# Patient Record
Sex: Female | Born: 1970 | Race: White | Hispanic: No | Marital: Single | Smoking: Never smoker
Health system: Southern US, Community
[De-identification: ages and names within clinical notes are randomized; demographics above are authoritative.]

## PROBLEM LIST (undated history)

## (undated) DIAGNOSIS — E119 Type 2 diabetes mellitus without complications: Secondary | ICD-10-CM

## (undated) DIAGNOSIS — F329 Major depressive disorder, single episode, unspecified: Secondary | ICD-10-CM

## (undated) DIAGNOSIS — K567 Ileus, unspecified: Secondary | ICD-10-CM

## (undated) DIAGNOSIS — M79673 Pain in unspecified foot: Secondary | ICD-10-CM

## (undated) DIAGNOSIS — G5793 Unspecified mononeuropathy of bilateral lower limbs: Secondary | ICD-10-CM

## (undated) DIAGNOSIS — R569 Unspecified convulsions: Secondary | ICD-10-CM

## (undated) DIAGNOSIS — G43909 Migraine, unspecified, not intractable, without status migrainosus: Secondary | ICD-10-CM

## (undated) DIAGNOSIS — E2740 Unspecified adrenocortical insufficiency: Secondary | ICD-10-CM

## (undated) DIAGNOSIS — F959 Tic disorder, unspecified: Secondary | ICD-10-CM

## (undated) DIAGNOSIS — I10 Essential (primary) hypertension: Secondary | ICD-10-CM

## (undated) DIAGNOSIS — F32A Depression, unspecified: Secondary | ICD-10-CM

## (undated) DIAGNOSIS — G629 Polyneuropathy, unspecified: Secondary | ICD-10-CM

## (undated) DIAGNOSIS — F1910 Other psychoactive substance abuse, uncomplicated: Secondary | ICD-10-CM

## (undated) DIAGNOSIS — M549 Dorsalgia, unspecified: Secondary | ICD-10-CM

## (undated) DIAGNOSIS — F419 Anxiety disorder, unspecified: Secondary | ICD-10-CM

## (undated) DIAGNOSIS — M1990 Unspecified osteoarthritis, unspecified site: Secondary | ICD-10-CM

## (undated) DIAGNOSIS — L0390 Cellulitis, unspecified: Secondary | ICD-10-CM

## (undated) DIAGNOSIS — G8929 Other chronic pain: Secondary | ICD-10-CM

## (undated) DIAGNOSIS — M797 Fibromyalgia: Secondary | ICD-10-CM

## (undated) HISTORY — DX: Cellulitis, unspecified: L03.90

## (undated) HISTORY — DX: Polyneuropathy, unspecified: G62.9

## (undated) HISTORY — PX: CHOLECYSTECTOMY: SHX55

## (undated) HISTORY — DX: Ileus, unspecified: K56.7

## (undated) HISTORY — DX: Unspecified convulsions: R56.9

## (undated) HISTORY — DX: Migraine, unspecified, not intractable, without status migrainosus: G43.909

## (undated) HISTORY — PX: OTHER SURGICAL HISTORY: SHX169

## (undated) HISTORY — DX: Anxiety disorder, unspecified: F41.9

## (undated) HISTORY — DX: Tic disorder, unspecified: F95.9

---
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
## 2010-04-30 HISTORY — PX: GASTRIC BYPASS: SHX52

## 2011-03-02 HISTORY — PX: ABDOMINOPLASTY: SUR9

## 2011-03-02 HISTORY — PX: ABDOMINAL HYSTERECTOMY: SHX81

## 2012-08-11 ENCOUNTER — Encounter (HOSPITAL_COMMUNITY): Payer: Self-pay | Admitting: *Deleted

## 2012-08-11 ENCOUNTER — Ambulatory Visit (HOSPITAL_COMMUNITY)
Admission: RE | Admit: 2012-08-11 | Discharge: 2012-08-11 | Disposition: A | Discharge status: Home / Self Care | Payer: PRIVATE HEALTH INSURANCE | Attending: Psychiatry | Admitting: Psychiatry

## 2012-08-11 DIAGNOSIS — F1020 Alcohol dependence, uncomplicated: Secondary | ICD-10-CM | POA: Insufficient documentation

## 2012-08-11 HISTORY — DX: Essential (primary) hypertension: I10

## 2012-08-11 NOTE — BH Assessment (Signed)
Assessment Note   Bethany Barton is an 42 y.o. female In for appt to have an assessment for CD IOP. She has an appt with Charmian Muff on June 16th at 10am and starts the CD program that afternoon.She brought up paperwork for client to complete and to bring to her appt on Monday and that paperwork was given to client. She is currently using alcohol every two to three weeks, and when drinks is drinking 3 liters of wine and blacks out. She has a history of cocaine and narcotic use but none in eight months. She had a 12 year sobriety until she has gastric bypass surgury in March 2012 and she got addicted to pain medications and got detoxed and put on Suboxone. She  is now off of that and has been for the past eight months. Her father died in an automobile crash in Nov and that increased her alcohol use. 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 She is currently on FMLA leave and feels her agency is looking at her more closely and critically than others and her dept has been unsupportive. She is attending AA at least four nights a week. Her last drink was eight days ago. She is not currently experiencing and symptoms of withdrawal.She is currently taking Prozac for a history of depression. 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 She has hypertension and is taking Lisinopril.She does not take her Neurontin regularly for her pain because her children say it makes her more irritable.She takes Xanax prn to help her sleep,this past week she has taken it once and denies abusing it.She has a long family history of substance use on both sides and her  mother is currently incarcerated related to substance use.She has a current DUI charge and a court date for July 9 th which she states she is not worried about, she believes it will be dropped.She has three biological children and one foster child and one grandchild she is motivated by to be clean as well as a supportive boyfriend. States she is motivated for treatment. Walked downstairs to outpatient to be familiar with where she would be going on Monday.   Axis I: alcohol dependence, narcotic dependence in remission,cocaine dependence in remission Axis II: Deferred Axis III:  Past Medical History  Diagnosis Date  . Hypertension    Axis IV: occupational problems and other psychosocial or environmental problems Axis V: 41-50 serious symptoms  Past Medical History:  Past Medical History  Diagnosis Date  . Hypertension     Past Surgical History  Procedure Laterality Date  . 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 Abdominal hysterectomy      Family History: No family history on file.  Social History:  reports that she drinks about 4.8 ounces of alcohol per week. She reports that she does not use illicit drugs. Her tobacco history is not on file.  Additional Social History:  Alcohol / Drug Use Pain Medications: not abusing now but histroy of abusing pain meds since 04/2011 after a surgury, last use 8 months ago Prescriptions: history  of use last used 8 months ago not abusing now Over the Counter: not abusing History of alcohol / drug use?: Yes Longest period of sobriety (when/how long): 12 years Negative Consequences of Use: Work / Programmer, multimedia (FMLA currently, work is requesting she be in treatment) Substance #1 Name of Substance 1: Alcohol 1 - Age of First Use: 41 1 - Amount (size/oz): 3 liters when drinks and is binge drinking every two to three weeks 1 - Frequency: since Nov 2013 1 - Duration: binge drinks every two to three weeks, 1 - Last Use / Amount: eight  days ago Substance #2 Name of Substance 2:  pain medications  2 - Age of First Use: 41 2 - Amount (size/oz): unknown` 2 - Frequency: daily for a year 2 - Duration: for a year 2 - Last Use / Amount: eight months ago Substance #3 Name of Substance 3: cocaine 3 - Age of First Use: 25 3 - Amount (size/oz): unknown 3 - Frequency: unknown 3 - Duration: unknown 3 - Last Use / Amount: 12 years ago  CIWA:   COWS:    Allergies:  Allergies  Allergen Reactions  . Iodine   . Neomycin   . Phenergan (Promethazine Hcl)     Home Medications:  (Not in a hospital admission)  OB/GYN Status:  No LMP recorded.  General Assessment Data Location of Assessment: [facility] Assessment Services Living Arrangements: Children;Spouse/significant other Can pt return to current living arrangement?: Yes Admission Status: Voluntary Is patient capable of signing voluntary admission?: Yes Transfer from: Home Referral Source: Self/Family/Friend  Education Status Is patient currently in school?: No  Risk to self Suicidal Ideation: No Suicidal Intent: No Is patient at risk for suicide?: No Suicidal Plan?: No Access to Means: No What has been your use of drugs/alcohol within the last 12 months?: has been binge drinking every two to three weeks Previous Attempts/Gestures: No How many times?: 0 Other Self Harm Risks: none Triggers for Past Attempts: Unknown Intentional Self Injurious Behavior: None Family Suicide History: No Recent stressful life event(s):  (Father died in Nov,conflict at work) Persecutory voices/beliefs?: No Depression: Yes Depression Symptoms: Insomnia (poor sleep also reltated to neuropathy pain and restless leg) Substance abuse history and/or treatment for substance abuse?: Yes Suicide prevention information given to non-admitted patients: Not applicable  Risk to Others Homicidal Ideation: No Thoughts of Harm to Others: No Current Homicidal Intent: No Current Homicidal Plan:  No Access to Homicidal Means: No History of harm to others?: No Assessment of Violence: None Noted Does patient have access to weapons?: No Criminal Charges Pending?: Yes Describe Pending Criminal Charges: DUI Does patient have a court date: Yes Court Date: 09/06/12  Psychosis Hallucinations: None noted Delusions: None noted  Mental Status Report Appear/Hygiene:  (unremarkable) Eye Contact: Good Motor Activity: Freedom of movement;Tics (facial tic from pain, mouth draws) Speech: Logical/coherent Level of Consciousness: Alert Mood:  (appropriate to situation) Affect: Appropriate to circumstance Anxiety Level: None Thought Processes: Coherent;Relevant Judgement: Unimpaired Orientation: Person;Place;Time;Situation Obsessive Compulsive Thoughts/Behaviors: None  Cognitive Functioning Concentration: Normal Memory: Recent Intact;Remote Intact IQ: Above Average Insight: Good Impulse Control: Fair Appetite: Good Weight Loss:  (has a history of gastric bypass two years ago, lost 150) Weight Gain: 0 Sleep: Decreased Total Hours of Sleep: 3 Vegetative Symptoms: None  ADLScreening [facility] Assessment Services) Patient's cognitive ability adequate to safely complete daily activities?: Yes Patient able to express need for assistance with ADLs?: Yes Independently performs ADLs?: Yes (appropriate for developmental age)  Abuse/Neglect [facility]) Physical  Abuse: Denies Verbal Abuse: Denies Sexual Abuse: Denies  Prior Inpatient Therapy Prior Inpatient Therapy: Yes Prior Therapy Dates: eight months ago Prior Therapy Facilty/Provider(s): [facility]  Reason for Treatment: detox off narcotics  Prior Outpatient Therapy Prior Outpatient Therapy: No Prior Therapy Dates: current and ongoing Prior Therapy Facilty/Provider(s): [facility] Reason for Treatment: current alcohol dependency  ADL Screening (condition at time of admission) Patient's cognitive ability adequate to safely complete  daily activities?: Yes Patient able to express need for assistance with ADLs?: Yes Independently performs ADLs?: Yes (appropriate for developmental age) Weakness of Legs: None Weakness of Arms/Hands: None  Home Assistive Devices/Equipment Home Assistive Devices/Equipment: None    Abuse/Neglect Assessment (Assessment to be complete while patient is alone) Physical Abuse: Denies Verbal Abuse: Denies Sexual Abuse: Denies Exploitation of patient/patient's resources: Denies Self-Neglect: Denies Values / Beliefs Cultural Requests During Hospitalization: None Spiritual Requests During Hospitalization: None   Advance Directives (For Healthcare) Advance Directive: Patient has advance directive, copy not in chart Nutrition Screen- [facility] Adult/[facility]/AP Patient's home diet: Regular Have you recently lost weight without trying?: No Have you been eating poorly because of a decreased appetite?: No Malnutrition Screening Tool Score: 0  Additional Information 1:1 In Past 12 Months?: No CIRT Risk: No Elopement Risk: No Does patient have medical clearance?: No     Disposition:  Disposition Initial Assessment Completed for this Encounter: Yes Disposition of Patient: Outpatient treatment Type of outpatient treatment: Chemical Dependence - Intensive Outpatient  On Site Evaluation by:   Reviewed with Physician:     Wynona Luna 08/11/2012 4:03 PM

## 2012-08-14 ENCOUNTER — Other Ambulatory Visit (HOSPITAL_COMMUNITY): Payer: PRIVATE HEALTH INSURANCE | Attending: Psychiatry | Admitting: Psychology

## 2012-08-14 DIAGNOSIS — F411 Generalized anxiety disorder: Secondary | ICD-10-CM | POA: Insufficient documentation

## 2012-08-14 DIAGNOSIS — F1020 Alcohol dependence, uncomplicated: Secondary | ICD-10-CM | POA: Insufficient documentation

## 2012-08-14 DIAGNOSIS — F332 Major depressive disorder, recurrent severe without psychotic features: Secondary | ICD-10-CM | POA: Insufficient documentation

## 2012-08-14 DIAGNOSIS — F1120 Opioid dependence, uncomplicated: Secondary | ICD-10-CM

## 2012-08-15 ENCOUNTER — Encounter (HOSPITAL_COMMUNITY): Payer: Self-pay | Admitting: Psychology

## 2012-08-15 DIAGNOSIS — F1120 Opioid dependence, uncomplicated: Secondary | ICD-10-CM | POA: Insufficient documentation

## 2012-08-15 NOTE — Progress Notes (Signed)
    Daily Group Progress Note  Program: CD-IOP   Group Time: 1-2:30 pm  Participation Level: Active  Behavioral Response: Appropriate and Sharing  Type of Therapy: Process Group  Topic: Group Process/Psycho-ed. The first half of group was spent with a brief check-in and sharing about recovery activities over the past weekend. Group members were provided a handout called the "Wheel of Life". the exercise asks members to rank where they stand in 8 categories. Those categories included: Physical Environment, Fun and Recreation, Health, Personal Growth, S/O/Romance, Friends and Family, Event organiser, Arts administrator, and Dietitian. The purpose of the exercise is to focus on the importance of 'Balance' in one's life and it easily shows where one needs to focus their energies to achieve more balance and health in one's life. Members each came up and charted their "wheel" and explained the rankings to the group. Also present were two students from the [employer] program. They participated in the exercise and shared their thoughts along with group members.   Group Time: 2:45-4 pm  Participation Level: Active  Behavioral Response: Sharing  Type of Therapy: Psycho-education Group  Topic: "The Wheel of Life.continued": the second half of group included more of the wheel with members drawing their wheels and explaining them to each other. There was good feedback and sharing among the group and the session proved very helpful in illuminating where members need to focus and apply more of their energies.   Summary: The patient was new to the group, but spoke openly and easily about her struggles with addiction. Her children's father died due to his addiction, her mother is in prison, and she even lost her own children for awhile during her active addiction. She described the problems that developed after a gastric bypass procedure and the use of opiates, which quickly grew out of control. The patient  provided excellent feedback and made some excellent comments in this first group session. The patient reported her sobriety date is 6/5.   Family Program: Family present? No   Name of family member(s):   UDS collected: No Results:   AA/NA attended?: YesFriday, Saturday and Sunday  Sponsor?: No   Perrin Gens, LCAS

## 2012-08-16 ENCOUNTER — Other Ambulatory Visit (HOSPITAL_COMMUNITY): Payer: PRIVATE HEALTH INSURANCE | Admitting: Psychology

## 2012-08-16 DIAGNOSIS — F1120 Opioid dependence, uncomplicated: Secondary | ICD-10-CM

## 2012-08-17 ENCOUNTER — Encounter (HOSPITAL_COMMUNITY): Payer: Self-pay | Admitting: Psychology

## 2012-08-17 NOTE — Progress Notes (Signed)
    Daily Group Progress Note  Program: CD-IOP   Group Time: 1-2:30 pm  Participation Level: Active  Behavioral Response: Appropriate and Sharing  Type of Therapy: Process Group  Topic: The Five Love Languages: second half of group was spent in a psycho-ed on the book written by Santina Dion Parrow. Members were given a handout and asked to complete the 30 question form. After they had completed the form, members shared their findings. While some were surprised, many group members stated that the findings seemed very accurate, but they had never realized that was a type of love language. Every member with an S/O took a blank sheet home with them and agreed to have their partner complete the questionnaire. Everyone acknowledged that knowing your love language as well as your partner's language will benefit and enhance their relationship. The session proved effective for the group.   Group Time: 2:45- 4pm  Participation Level: Active  Behavioral Response: Sharing  Type of Therapy: Psycho-education Group  Topic: The Five Love Languages: second half of group was spent in a psycho-ed on the book written by Santina Pattye Meda. Members were given a handout and asked to complete the 30 question form. After they had completed the form, members shared their findings. While some were surprised, many group members stated that the findings seemed very accurate, but they had never realized that was a type of love language. Every member with an S/O took a blank sheet home with them and agreed to have their partner complete the questionnaire. Everyone acknowledged that knowing your love language as well as your partner's language will benefit and enhance their relationship. The session proved effective for the group.   Summary: The patient reported she had attended a meeting since the last group. She noted she had also taken my suggestion and gone for walks each of the last 2 days. She had really enjoyed them, felt  much better, and was able to have a good talk with her son during their walk together. The patient reported that despite taking her son to [facility] yesterday, she still managed to attend 2 AA meetings. The patient explained that her son may have some sort of rheumatoid or arthritic disease, but she feels confident that they are able to do everything possible for him at [facility]. The patient stated she enjoyed the questionnaire and agreed with the findings. She likes quality time and admitted she was looking forward to seeing what her S/O identifies as his primary love language. The patient made some good comments and displayed excellent motivation for her recovery. She responded well to this intervention.   Family Program: Family present? No   Name of family member(s):   UDS collected: No Results:   AA/NA attended?: [place]  Sponsor?: No, but she is new to the program and asked some good questions about how to secure a sponsor   Brittney Caraway, LCAS

## 2012-08-18 ENCOUNTER — Other Ambulatory Visit (HOSPITAL_COMMUNITY): Payer: PRIVATE HEALTH INSURANCE | Admitting: Psychology

## 2012-08-18 DIAGNOSIS — F1120 Opioid dependence, uncomplicated: Secondary | ICD-10-CM

## 2012-08-21 ENCOUNTER — Encounter (HOSPITAL_COMMUNITY): Payer: Self-pay | Admitting: Psychology

## 2012-08-21 ENCOUNTER — Other Ambulatory Visit (HOSPITAL_COMMUNITY): Payer: PRIVATE HEALTH INSURANCE

## 2012-08-21 NOTE — Progress Notes (Signed)
    Daily Group Progress Note  Program: CD-IOP   Group Time: 1-2:45 pm  Participation Level: Active  Behavioral Response: Sharing  Type of Therapy: Process Group  Topic: Group Process/Introduction: The first half of group was spent in process. Members shared about current issues and concerns that they are dealing with in early recovery. There was also a new member in group and she shared about herself and what had brought her to seek treatment. During this session, the medical director was meeting individually with some of the new group members. Some very serious and intense issues were shared and it was an emotional time for some.   Group Time: 3-4 pm  Participation Level: Active  Behavioral Response: Sharing  Type of Therapy: Psycho-education Group  Topic: Addiction and the Relapse Process: the second half of group was spent in a psycho-ed session. Members were provided a handout that identified the difficulty in remaining sober and the chronic relapsing nature of this illness for many. The four stages of relapse were identified and members shared how they might address each stage to insure that they don't relapse.    Summary: The patient reported she was doing well. She had attended 3 meetings since our last group session. She provided good feedback and support to her fellow group members. She noted that her children are responding very favorably to her sobriety and she feels much better. The patient met with the medical director during the half of group. This was her first group session with him. In the second half of group, the patient pointed out that staying engaged in the 12-step community and talking with her sponsor will insure that she is staying on track and not slipping down the relapse process. 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 She noted that apparently they instituted a policy  immediately after they discovered she was on suboxone. The patient reported her S/O was coming home later today and they always have 'date night' on Saturday evening. She is looking forward to seeing how he scores on the [employer] we studied earlier in the week. The patient displays excellent motivation and good insight into her addiction. Her sobriety date remains 6/5.   Family Program: Family present? No   Name of family member(s):   UDS collected: Yes Results:  Not yet returned  AA/NA attended?: YesWednesday and Thursday  Sponsor?: Yes   Erminia Mcnew, LCAS

## 2012-08-23 ENCOUNTER — Other Ambulatory Visit (HOSPITAL_COMMUNITY): Payer: PRIVATE HEALTH INSURANCE | Admitting: Psychology

## 2012-08-23 ENCOUNTER — Encounter (HOSPITAL_COMMUNITY): Payer: Self-pay

## 2012-08-23 VITALS — BP 166/100 | HR 67 | Ht 64.0 in | Wt 170.8 lb

## 2012-08-23 DIAGNOSIS — F959 Tic disorder, unspecified: Secondary | ICD-10-CM | POA: Insufficient documentation

## 2012-08-23 DIAGNOSIS — G629 Polyneuropathy, unspecified: Secondary | ICD-10-CM | POA: Insufficient documentation

## 2012-08-23 DIAGNOSIS — F1020 Alcohol dependence, uncomplicated: Secondary | ICD-10-CM

## 2012-08-23 DIAGNOSIS — I10 Essential (primary) hypertension: Secondary | ICD-10-CM | POA: Insufficient documentation

## 2012-08-23 DIAGNOSIS — F1120 Opioid dependence, uncomplicated: Secondary | ICD-10-CM

## 2012-08-23 MED ORDER — GABAPENTIN 400 MG PO CAPS: 400.0000 mg | ORAL_CAPSULE | Freq: Four times a day (QID) | ORAL | Status: DC
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
## 2012-08-25 ENCOUNTER — Encounter (HOSPITAL_COMMUNITY): Payer: Self-pay | Admitting: Psychology

## 2012-08-25 ENCOUNTER — Other Ambulatory Visit (HOSPITAL_COMMUNITY): Payer: PRIVATE HEALTH INSURANCE | Admitting: Psychology

## 2012-08-25 ENCOUNTER — Other Ambulatory Visit (HOSPITAL_COMMUNITY): Payer: Self-pay | Admitting: Physician Assistant

## 2012-08-25 DIAGNOSIS — F1120 Opioid dependence, uncomplicated: Secondary | ICD-10-CM

## 2012-08-25 DIAGNOSIS — F1020 Alcohol dependence, uncomplicated: Secondary | ICD-10-CM

## 2012-08-25 NOTE — Telephone Encounter (Signed)
  Subjective: Bethany Barton was seen at her request. She reports that she continues to have peripheral neuralgia, and the Neurontin that she is currently taking 400 mg 4 times a day wears off after about 4 hours. She reports that her distal lower extremities "feel like they're on fire." Her sleep has improved since we started her on the trazodone, but she still not going to bed until around midnight and gets up around 6:30.  Objective: Well-nourished well-developed, fully alert and oriented, no acute distress, anxious with a congruent affect. Speech is clear and coherent had a regular rate and rhythm and normal volume. Insight and judgment are fair. Patient has some patchy erythema in her feet bilaterally.  Assessment and Plan: We will increase her Neurontin to 400 mg every 4 hours, and we will followup to assess response. She is instructed to try to get to bed an hour earlier. As she has been instructed to get 70 g of protein daily, she was informed about a protein supplement that has 30 g per bottle.

## 2012-08-25 NOTE — Progress Notes (Signed)
    Daily Group Progress Note  Program: CD-IOP   Group Time: 1-2:30 pm  Participation Level: Active  Behavioral Response: Sharing  Type of Therapy: Process Group  Topic:Process: the first part of group was spent in process. Members shared about their progress in early recovery and any obstacles or difficulties they have faced since the last group session. All of the members except the newest one had attended at least one meeting since the Wednesday group session. I emphasized the importance of behaviors in recovery and reminded the group that while they are sitting around, 'Chucky' is doing push-ups. There was good feedback and discussion among members. During this time, the medical director met with two new patients and three current patients that were inquiring about med changes or refills.   Group Time: 2:45- 4pm  Participation Level: Active  Behavioral Response: Sharing  Type of Therapy: Psycho-education Group  Topic: Resentments, #2: the second half of group was spent completing the two-part presentation on resentments. The handout from Wednesday was reviewed and a discussion ensued about why resentments are so difficult. One member said it succinctly when he stated that by forgiving someone you are essentially, "letting them off the hook". I pointed out that is exactly what most people think and it is skewed thinking. Forgiveness is purely for the one forgiving and in no way does it lessen or negate the degree of damage done by the initial wrong. The threat that resentments play in early recovery was discussed and the importance of letting go of grudges is important to free one's self and promote continued emotional and spiritual growth. The session proved very compelling with good disclosure among members. The issues will need to be addressed again, since many of the members didn't seem to 'buy into' the explanation.    Summary: The patient reported she had attended meetings since  the last group session. She had many comments during the second half of group and described some of the resentments that she is struggling with, including anger at her former co-workers. She also admitted she had been very angry at her mother for years, but then received a letter from her mother, who is in prison, telling her everything she had wanted to hear her entire life. The patient described a busy weekend. She met briefly with the medical director to discuss medications. She provided a suboxone positive UA that was collected on the 20th, but insisted she would refer to her sobriety date as her previous one since she denies using. She reported her sobriety date is 6/5.    Family Program: Family present? No   Name of family member(s):   UDS collected: No Results:   AA/NA attended?: YesWednesday and Thursday  Sponsor?: Yes   Bethany Barton, LCAS

## 2012-08-25 NOTE — Progress Notes (Signed)
    Daily Group Progress Note  Program: CD-IOP   Group Time: 1-2:30 pm  Participation Level: Active  Behavioral Response: Sharing  Type of Therapy: Process Group  Topic: Group Process/Introductions: the first part of group was spent in process. Members shared about their current struggles and issues in early recovery. One member expressed frustration with the Soboxone-positive drug test from last week, insisting that she hadn't used anything in almost a month. Another member shared about her challenges and pressures at work.  Two new members were present and they shared a little about their addiction and what had brought them into treatment. There was good disclosure and feedback among the group members.  Group Time: 2:45- 4pm  Participation Level: Active  Behavioral Response: Sharing  Type of Therapy: Psycho-education Group  Topic: Resentments; #1: the second half of group was spent discussing resentments. A handout was provided describing how resentments develop. They come when anger is not addressed and it eventually turns in to resentment. The group discussed some of the resentments they had and were able to identify some of the problems that resentments can create. The session proved very interesting with some members fully cognizant of their resentments, but unclear about whether or not they were ready to let them go.    Summary: the patient was the one who tested positive for Suboxone. Today, she continued to express frustration over the positive UA and insisted that she had not used any Suboxone since the first of the month. She admitted she may have used on her birthday, the [birth year]rd, when she got very drunk. The patient reported that she felt badly that her fellow group members didn't believe her and it made her feel less connected with the others in the group. She wondered if it would be good if she attended some of the meetings here in town that the other ladies attend? She lives  in [place] and attends AA meetings up there so no one really knows her or interacts with her beyond these sessions. The patient provided good feedback during the second half of group and agreed that resentments were very difficult. The patient has excellent attendance, but the positive UA is troubling and the lab has insisted that it will not be present after 24 hours. What is significant is that not only was the metabolite identified, but actual chemical itself so it couldn't have been taken very long prior to the collection. This makes her story even more troubling. The patient admitted she didn't know what she would do if this test collected today was positive. I encouraged her to focus on today and do what she needs to do to meet her responsibilities for today. Her sobriety date remains confusing at this point, but after treatment team we will agree on what her new date will be.    Family Program: Family present? No   Name of family member(s):   UDS collected: Yes Results: not returned yet  AA/NA attended?: YesMonday, Tuesday and Wednesday  Sponsor?: Yes   Frona Yost, LCAS

## 2012-08-28 ENCOUNTER — Other Ambulatory Visit (HOSPITAL_COMMUNITY): Payer: PRIVATE HEALTH INSURANCE

## 2012-08-30 ENCOUNTER — Other Ambulatory Visit (HOSPITAL_COMMUNITY): Payer: PRIVATE HEALTH INSURANCE | Attending: Psychiatry

## 2012-08-30 DIAGNOSIS — F332 Major depressive disorder, recurrent severe without psychotic features: Secondary | ICD-10-CM | POA: Insufficient documentation

## 2012-08-30 DIAGNOSIS — F1020 Alcohol dependence, uncomplicated: Secondary | ICD-10-CM | POA: Insufficient documentation

## 2012-08-30 DIAGNOSIS — F411 Generalized anxiety disorder: Secondary | ICD-10-CM | POA: Insufficient documentation

## 2012-09-04 ENCOUNTER — Other Ambulatory Visit (HOSPITAL_COMMUNITY): Payer: PRIVATE HEALTH INSURANCE

## 2012-09-04 ENCOUNTER — Other Ambulatory Visit (HOSPITAL_COMMUNITY): Payer: Self-pay | Admitting: Physician Assistant

## 2012-09-04 MED ORDER — FLUOXETINE HCL 20 MG PO CAPS: 20.0000 mg | ORAL_CAPSULE | Freq: Every day | ORAL | Status: DC

## 2012-09-06 ENCOUNTER — Other Ambulatory Visit (HOSPITAL_COMMUNITY): Payer: PRIVATE HEALTH INSURANCE

## 2012-09-08 ENCOUNTER — Other Ambulatory Visit (HOSPITAL_COMMUNITY): Payer: PRIVATE HEALTH INSURANCE | Admitting: Psychology

## 2012-09-08 DIAGNOSIS — F1120 Opioid dependence, uncomplicated: Secondary | ICD-10-CM

## 2012-09-08 DIAGNOSIS — F1020 Alcohol dependence, uncomplicated: Secondary | ICD-10-CM

## 2012-09-08 NOTE — Progress Notes (Signed)
Patient ID: Bethany Barton, female   DOB: [birth year], 42 y.o.   MRN: [id number]   Subjective: Patient asked to be seen today because she checked her blood pressure in the pharmacy the other day and it was extremely high. She reports that she had a extreme headache the other day, and feels that it was caused by hypertension. She is currently on lisinopril 10 mg daily for the past 2 months or more.  Objective: Well-nourished well-developed white female, fully alert and oriented, and in no acute distress.  Flush face. Anxious mood and affect   Blood pressure 187/122 with a pulse of 71 beats per minute  Assessment and Plan: Uncontrolled hypertension. Will increase lisinopril to 20 mg daily. She has been instructed to take a second 10 mg today. We will recheck her blood pressure in 3 days. If still elevated we will again increase her lisinopril. If still extremely elevated will initiate clonidine.

## 2012-09-10 ENCOUNTER — Encounter (HOSPITAL_COMMUNITY): Payer: Self-pay | Admitting: Psychology

## 2012-09-10 NOTE — Progress Notes (Signed)
    Daily Group Progress Note  Program: CD-IOP   Group Time: 1-2:30 pm  Participation Level: Active  Behavioral Response: Sharing  Type of Therapy: Process Group  Topic: Group Process: The first half of group was spent in process. Members shared about what they have been doing to support their recovery. As is customary on Friday, the medical director was meeting with any new group members or others who needed med checks, follow-ups, or were reporting problems. I questioned one member and his previous reports in group after having spoken with his wife yesterday afternoon. He has been sleeping all day. The patient made many excuses and does not, in fact, appear to be doing much to embrace this new lifestyle of sobriety. Other members offered encouragement and provided examples of how they have addressed similar issues. The session proved instructional and provided a very clear example of how one must 'work at it' to make changes.   Group Time: 2:45- 4pm  Participation Level: Active  Behavioral Response: Sharing  Type of Therapy: Psycho-education Group  Topic: Psycho-Ed/Graduation: second half of group was spent in a brief psycho-ed session going over the handout entitled "90 Tools for Sobriety". The handout listed 90 different parts or elements of sobriety. As the session neared the end, a graduation ceremony was held to honor a member who was successfully completing the program today. There were brownies, kind words, and tears. As the session concluded, members shared what they intended to do over the weekend ahead to remain alcohol and drug-free.    Summary: The patient reported she had had her DUI case dismissed on Wednesday. She had missed group due to a court date in [place] and shared this news this afternoon. She admitted she was very relieved that she managed to avoid any further complications. The patient expressed frustration about her children and complained that her oldest - a  40 yo - doesn't seem to take any steps by himself. "I have to do everything", she reported. I challenged her and when further questioned, she admitted that she has enabled him by taking care of him and always doing everything for him. As she talked more, it became very clear to the other group members as well as this patient, that she is very co-dependent and, in some ways, doesn't want her children to be independent and autonomous. The patient talked about her work as a Engineer, civil (consulting) in [place], [place] and noted she had contacted her supervisor about coming back early. I reminded her that she has nothing to say about her co-workers, but very undesirable unkind things. Why would she want to return to a work place that has stigmatized her due to her addiction? Another member pointed out she is a Engineer, civil (consulting) and could get a job almost anywhere. The patient agreed with this observation. She asked to meet with the medical director to discuss her neuropathy and was able to meet with him as the session ended. The patient is very active in the 12-step community, but displays very poor insight about child rearing and healthy behaviors. She insists her sobriety date remains 6/5 despite a Suboxone+ UA collected in late June. We will continue to follow closely in the days ahead.     Family Program: Family present? No   Name of family member(s):   UDS collected: No Results:   AA/NA attended?: YesWednesday and Thursday  Sponsor?: Yes   Shilpa Bushee, LCAS

## 2012-09-11 ENCOUNTER — Other Ambulatory Visit (HOSPITAL_COMMUNITY): Payer: PRIVATE HEALTH INSURANCE

## 2012-09-13 ENCOUNTER — Other Ambulatory Visit (HOSPITAL_COMMUNITY): Payer: PRIVATE HEALTH INSURANCE | Admitting: Psychology

## 2012-09-13 ENCOUNTER — Other Ambulatory Visit (HOSPITAL_COMMUNITY): Payer: Self-pay | Admitting: Physician Assistant

## 2012-09-13 MED ORDER — CLONIDINE HCL 0.1 MG PO TABS: 0.1000 mg | ORAL_TABLET | Freq: Two times a day (BID) | ORAL | Status: DC

## 2012-09-13 NOTE — Telephone Encounter (Signed)
Bethany Barton blood pressure continues to be elevated in spite of increasing her lisinopril from 10 mg daily to 20 mg last week. On 09/11/2012 her blood pressure was 153/107 with a pulse of 75. Today her blood pressure was 180/120. We'll start her on clonidine 0.1 mg twice daily, and recheck her blood pressure in 2 days.

## 2012-09-14 ENCOUNTER — Encounter (HOSPITAL_COMMUNITY): Payer: Self-pay | Admitting: Psychology

## 2012-09-14 ENCOUNTER — Encounter (HOSPITAL_COMMUNITY): Payer: Self-pay

## 2012-09-14 NOTE — Progress Notes (Signed)
Daily Group Progress Note  Program: CD-IOP   Group Time: 1-2:30 pm  Participation Level: Active  Behavioral Response: Appropriate and Sharing  Type of Therapy: Process Group  Topic: Group process: the first half of group was spent in process. Members shared about their current issues and concerns in early recovery. One member was very quiet and a fellow member asked about this. The member admitted that he was miserable and had begun questioning why he was choosing sobriety if it was so miserable. This disclosure proved to be very surprising to most of the group because the disclosing member has been the group "clown" since entering the program. To hear that he had been thinking about actually walking out of the session, questioning his drinking, or actually having thoughts of hurting himself, was a shock to everyone and there was silence as his feelings were absorbed. The remainder of the session was spent discussing the challenges and actual pain of early recovery and the difficulties that might be expected. Members shared how they felt about this member and the caring and love they had come to feel towards him. They also expressed frustration that they couldn't really help him feel better or, in some way, ease his pain. The session was not a step-back for the group, but a recognition that early recovery isn't always fun, might be painful, and could lead one back to choosing their addiction over sobriety. It proved to be a 'sobering' session.  Group Time: 2:45- 4pm  Participation Level: Active  Behavioral Response: Sharing   Type of Therapy: Psycho-education  Topic: Identifying Values/Graduation: the second half of group was spent in a psycho-ed piece on Values. Included in the presentation was a handout which identified many different values and asked members to identify 3 things they can do to demonstrate those values. One member identified "Dependability" as a value she wanted to  live out on a daily basis and identified behaviors/actions she would carry out to demonstrate that value. At the conclusion of the session, a graduation ceremony was held for a member who was leaving having successfully completed the program. There were tears and kind words expressed to this woman who has been such a powerful presence in this program.   Summary: The patient reported she had attended a number of AA, NA meetings since the last group. She also reported having attended a session at [facility] on Monday evenings about cancer and diet. The patient found the session very informative and motivating. The speaker had emphasized following a "Mediterranean Diet". few group members knew about this type of eating and this patient explained the sorts of foods that were included under this type of diet. Another member noted he would Google it and begin eating those foods. The patient reported she had taken the suggestions of the members during the last session and talked to her youngest child, 55 yo [place]. Kayana reported she is not going to take the child to any more 12-step meetings. The child is almost hypersensitive to her mother's recovery needs and encourages her to go to meetings. In the session on Values, the patient identified 'Dependability" as one that she really believes in and was able to identify 3 things she can do to be dependable. This brought up good discussion since it is a hallmark of recovery. 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 The patient  had been proactive and her efforts were validated. In the graduation, the patient wished the departing member good luck and reminded her that they both share a common characteristic of giving too much of themselves to others and not caring enough for themselves. The patient made some good comments today and responded well to  this intervention. Her sobriety date remains 6/5.    Family Program: Family present? No   Name of family member(s):   UDS collected: Yes Results:not yet returned  AA/NA attended?: YesMonday and Tuesday  Sponsor?: Yes   Jaxsin Bottomley, LCAS

## 2012-09-14 NOTE — Progress Notes (Unsigned)
    Daily Group Progress Note  Program: CD-IOP   Group Time: 1pm-2:30 pm  Participation Level: Active  Behavioral Response: Appropriate and Sharing  Type of Therapy: Process Group  Topic: The group process time was spent discussing the patients plans for the upcomming 4th of July holiday. They wrote out their plana dn shared it with the group. This patient has a solid plan for the weekend- including a day of 12-step activities and time with her supportive, non-drinking family. She shared about how she had spent the holiday in the past and is looking forward to having a more positive day.      Group Time: 2:30pm-4pm   Participation Level: Active  Behavioral Response: Appropriate and Sharing  Type of Therapy: Education and Training Group  Topic: Pharmacist- medication educaion   Summary:The [facility] pharmacist come and spoke to the group about medication and recovery. She discussed types of helpful medications as well as clearly defining those which should not be used in recovery. She answered extensive questions about pain management, depression, anxiety, cravings and ADD. She was helpful in delineating between addictive and abusable meds vs medications which are more safe and helpful to take. This patient was attentive and appropriate. She had lots of solid, important questions regarding the use of neurontin for her neuropathy as well as baclofen. She was receptive to feedback as well.    Family Program: Family present? No   Name of family member(s): 0  UDS collected: No Results: negative  AA/NA attended?: YesMonday, Tuesday and Wednesday  Sponsor?: Yes   [facility]

## 2012-09-15 ENCOUNTER — Other Ambulatory Visit (HOSPITAL_COMMUNITY): Payer: PRIVATE HEALTH INSURANCE | Admitting: Psychology

## 2012-09-15 DIAGNOSIS — F1120 Opioid dependence, uncomplicated: Secondary | ICD-10-CM

## 2012-09-15 DIAGNOSIS — F1020 Alcohol dependence, uncomplicated: Secondary | ICD-10-CM

## 2012-09-16 ENCOUNTER — Encounter (HOSPITAL_COMMUNITY): Payer: Self-pay | Admitting: Psychology

## 2012-09-16 NOTE — Progress Notes (Signed)
    Daily Group Progress Note  Program: CD-IOP   Group Time: 1-2:45 pm  Participation Level: Active  Behavioral Response: Appropriate and Sharing  Type of Therapy: Process Group  Topic: Group Process: the first part of group was spent in process. One member had experienced a terrible loss with the news that her boyfriend had overdosed and died yesterday. The patient had appeared for the session accompanied by her mother and sister. The group members offered their condolences and shared their ways of having dealt with pain and loss in the past. They applauded and honored her willingness to be here in group today.  I presented a very brief reminder on the autonomy we all have when making choices and emphasized that no one is responsible for any one else's behavior. The patient received good feedback and support and her pain was shouldered as much as possible by her fellow group members. Prior to the break, a clip was shown from the DVD, "Drunk". It included a brief monologue from a woman in an AA meeting and the way her addiction had affected her son. It was very powerful and brought some group members to tears.   Group Time: 3-4 pm  Participation Level: Active  Behavioral Response: Sharing  Type of Therapy: Psycho-education Group  Topic: Values, Part 2: the second half of group was spent in a psycho-education session on the importance of identifying values in early recovery as well as behaviors that demonstrate those values. During this time, the medical director met with a number of patients for medication checks. He also met with the grieving patient and her family to address anxiety and issues he may be able to assist her with during the period. As the conclusion of the session neared, members shared about their recovery-related plans for the weekend.    Summary: the patient offered kind words of comfort and support to her fellow group member. Kailynn shared how she had felt after her  husband had overdosed from his addiction. She had felt he had at last gained some peace, but she also noted he was older than this young man. The patient applauded her willingness to be here and encouraged this young woman to remain sober and drug-free in order to fully experience her grief. The patient made some good comments about her values and the importance of identifying them. She met with the medical director after she had her blood pressure checked at break. She has been struggling with an elevated blood pressure reading for a few days. 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    Family Program: Family present? No   Name of family member(s):   UDS collected: No Results:   AA/NA attended?: YesWednesday and Thursday  Sponsor?: Yes   Jaxsyn Azam, LCAS

## 2012-09-18 ENCOUNTER — Other Ambulatory Visit (HOSPITAL_COMMUNITY): Payer: PRIVATE HEALTH INSURANCE

## 2012-09-20 ENCOUNTER — Other Ambulatory Visit (HOSPITAL_COMMUNITY): Payer: Self-pay | Admitting: Physician Assistant

## 2012-09-20 ENCOUNTER — Other Ambulatory Visit (HOSPITAL_COMMUNITY): Payer: PRIVATE HEALTH INSURANCE | Admitting: Psychology

## 2012-09-20 DIAGNOSIS — F1020 Alcohol dependence, uncomplicated: Secondary | ICD-10-CM

## 2012-09-20 DIAGNOSIS — F1120 Opioid dependence, uncomplicated: Secondary | ICD-10-CM

## 2012-09-20 MED ORDER — LISINOPRIL 30 MG PO TABS: 30.0000 mg | ORAL_TABLET | Freq: Every day | ORAL | Status: DC

## 2012-09-20 MED ORDER — GABAPENTIN 400 MG PO CAPS: ORAL_CAPSULE | ORAL | Status: DC

## 2012-09-21 ENCOUNTER — Encounter (HOSPITAL_COMMUNITY): Payer: Self-pay | Admitting: Psychology

## 2012-09-21 NOTE — Progress Notes (Signed)
    Daily Group Progress Note  Program: CD-IOP   Group Time: 1-2:30 pm  Participation Level: Active  Behavioral Response: Appropriate and Sharing  Type of Therapy: Psycho-education Group  Topic:Check-in and Assertive Communication: the first part of group was spent in a psycho-ed session. After checking-in and sharing about current issues, handouts were provided on the different types of communication. The remainder of the session was spent identifying and discussing the different types of communication. A number of members shared about their early life and lack of communication and expediencies that resulted from those unhealthy ways of relating. The session proved enlightening and informative.    Group Time: 2:45-4pm  Participation Level: Active  Behavioral Response: Sharing  Type of Therapy: Education and Training Group  Topic: The second part of the session was spent in a psycho-ed piece with Eloisa Northern, a therapist here at  [facility]. She provided education on heart math, a practice that teaches patients to learn how to control heart arte and deal with more effectively with stress. After a brief introduction, group members volunteered to "hook up" and go through the heart math exercise. Out of 4 group members, all were very stressed out at the beginning of the exercise, but everyone of them experienced and displayed a marked reduction in stress as evidenced by their heart rate. The session was informative and proved very surprising and compelling to group members. The session proved very effective.   Summary: The patient was attentive and shared openly in group. She reported that her S/O had really enjoyed the Family Session and they had both benefited from it. The patient laughed as the group discussed passivity and admitted that by not making any decisions, she doesn't risk being responsible if her decisions end up poorly. The patient reported that she had drawn two circles on a  chalk board at home and showed her kids that she and their father were on the top and had the power and the kids were at the bottom. She admitted that this explanation had been very helpful in allowing her to begin taking over more of her responsibilities as a parent since she is sober again. The patient agreed to be  Hooked up to the monitor during the heartmath and she was able to go from a very stressful recording to a much more relaxed and calm heart rate. She admitted she felt very relaxed! The patient had probably never been so much at peace. She continues to make excellent progress in her recovery and her sobriety date is 6/5.     Family Program: Family present? No   Name of family member(s):   UDS collected: No Results:  AA/NA attended?: YesMonday, Tuesday and Wednesday  Sponsor?: Yes   Mckinzey Entwistle, LCAS

## 2012-09-22 ENCOUNTER — Other Ambulatory Visit (HOSPITAL_COMMUNITY): Payer: PRIVATE HEALTH INSURANCE

## 2012-09-25 ENCOUNTER — Other Ambulatory Visit (HOSPITAL_COMMUNITY): Payer: PRIVATE HEALTH INSURANCE

## 2012-09-27 ENCOUNTER — Other Ambulatory Visit (HOSPITAL_COMMUNITY): Payer: PRIVATE HEALTH INSURANCE

## 2012-09-29 ENCOUNTER — Other Ambulatory Visit (HOSPITAL_COMMUNITY): Payer: PRIVATE HEALTH INSURANCE | Attending: Psychiatry

## 2012-09-29 DIAGNOSIS — F332 Major depressive disorder, recurrent severe without psychotic features: Secondary | ICD-10-CM | POA: Insufficient documentation

## 2012-09-29 DIAGNOSIS — F1020 Alcohol dependence, uncomplicated: Secondary | ICD-10-CM | POA: Insufficient documentation

## 2012-09-29 DIAGNOSIS — F411 Generalized anxiety disorder: Secondary | ICD-10-CM | POA: Insufficient documentation

## 2012-10-02 ENCOUNTER — Other Ambulatory Visit (HOSPITAL_COMMUNITY): Payer: PRIVATE HEALTH INSURANCE | Admitting: Psychology

## 2012-10-02 DIAGNOSIS — F1120 Opioid dependence, uncomplicated: Secondary | ICD-10-CM

## 2012-10-02 DIAGNOSIS — F1020 Alcohol dependence, uncomplicated: Secondary | ICD-10-CM

## 2012-10-02 NOTE — Progress Notes (Signed)
Bethany Barton is a 42 y.o. female patient who has completed 15 session in CDIOP and maintained negative Urine Drug Screens.She is working a Education officer, museum daily and regular step work with her sponsor. Most recently her blood pressure has been checked by our RN staff and some medication changes have been made. The most recent blood pressure numbers taken for her are as follows: 187/122 on 09/08/12. At that point [name], PA-C started her on 10 of lisonpril. On 09/11/12: 153/107 meds were incresed to 20 of lisonpril. Final BP reading taken on 09/15/12: 180/120. She was started on .1 BID of clonidine at that point. Also her lisonpril has been increased to 30mg  daily. Pt has not been in group since 7/23 due to vacation. She will return for an individual session this week, group sessions Monday, Wednesday and Friday this week, a family session next Monday 10/09/12 and medication management PRN.         [facility]

## 2012-10-03 ENCOUNTER — Telehealth (HOSPITAL_COMMUNITY): Payer: Self-pay | Admitting: Dietician

## 2012-10-03 ENCOUNTER — Encounter (HOSPITAL_COMMUNITY): Payer: Self-pay | Admitting: Psychology

## 2012-10-03 NOTE — Telephone Encounter (Signed)
Called back at 1423. Pt registered for 10/12/12 class at 1730.

## 2012-10-03 NOTE — Progress Notes (Unsigned)
    Daily Group Progress Note  Program: CD-IOP   Group Time: 1-2:30 pm  Participation Level: Active  Behavioral Response: Appropriate and Sharing  Type of Therapy: Psycho-education Group  Topic: Group Process/Mind/Body/Spirit: the group began with check-in from the weekend. Members shared what they had done to support their recovery. Also discussed were obstacles or problems that may have appeared since we last met. A handout was provided to all group members titled, "Balance of Mind, Body, and Spirit". Members were asked to identify what they do to balance and feed each part of themselves. Each member came up and wrote down what they do in each area. The importance of finding balance was emphasized.   Group Time: 2:45- 4pm  Participation Level: Active  Behavioral Response: Sharing  Type of Therapy: Psycho-education Group  Topic: Family Sculpture: the second half of group was spent in an activity called "Family Sculpture". Members were asked to sculpt their 'biological family'. this included using other group members to represent each of their family members in a scene reflective of their family life when they were children. The first sculpture proved very interesting and painful as the female member sculpted a critical, argumentative, scene with her family. There was good discussion and feedback among group members.    Summary: The patient returned to group today after having missed last week due to a family vacation at [employer]. She reported it had been a fun trip, but she was very glad to be back home and she noted she had really missed the group. The patient noted she had attended 12-step meetings while in [place] and had found them very good. She admitted she is stressed about her job and they are wanting her to come back, but her supervisor told her he would treat her differently because of what has happened. The patient identified her three practices as walking, reading the big  book and meditation or prayer. She made some good comments during the [facility] and provided good feedback to her fellow group members. Her sobriety date remains 6/5.    Family Program: Family present? No   Name of family member(s):   UDS collected: Yes Results: not returned   AA/NA attended?: YesSaturday and Sunday  Sponsor?: Yes   Janiah Devinney, LCAS

## 2012-10-03 NOTE — Telephone Encounter (Signed)
Received message from pt at 1316. Requesting information on DM class.

## 2012-10-04 ENCOUNTER — Other Ambulatory Visit (HOSPITAL_COMMUNITY): Payer: PRIVATE HEALTH INSURANCE | Admitting: Psychology

## 2012-10-04 DIAGNOSIS — F1120 Opioid dependence, uncomplicated: Secondary | ICD-10-CM

## 2012-10-04 DIAGNOSIS — F1020 Alcohol dependence, uncomplicated: Secondary | ICD-10-CM

## 2012-10-05 ENCOUNTER — Encounter (HOSPITAL_COMMUNITY): Payer: Self-pay | Admitting: Psychology
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
## 2012-10-06 ENCOUNTER — Other Ambulatory Visit (HOSPITAL_COMMUNITY): Payer: PRIVATE HEALTH INSURANCE | Admitting: Psychology

## 2012-10-06 DIAGNOSIS — F1120 Opioid dependence, uncomplicated: Secondary | ICD-10-CM

## 2012-10-06 DIAGNOSIS — F1020 Alcohol dependence, uncomplicated: Secondary | ICD-10-CM

## 2012-10-08 ENCOUNTER — Encounter (HOSPITAL_COMMUNITY): Payer: Self-pay | Admitting: Psychology

## 2012-10-08 NOTE — Progress Notes (Signed)
    Daily Group Progress Note  Program: CD-IOP   Group Time: 1-2:30 pm  Participation Level: Active  Behavioral Response: Appropriate and Sharing  Type of Therapy: Process Group  Topic: Group Process: the first part of group was spent in process. Members shared bout current issues and concerns. The newest group member vented and disclosed many of his feelings felt in the past 45 hours or since he left this group session on Wednesday. During the time the medical director met with another new member. A brief psycho-ed piece was provided on the 4-piece relapse process. There was a good discussion and feedback on disclosures in this session.   Group Time: 2:45- 4pm  Participation Level: Active  Behavioral Response: Appropriate and Sharing  Type of Therapy: Activity Group  Topic: Family Sculpture: the second half of group was spent in an activity  that we first began on Monday. Since the first day, members have admitted being very interested in their own biological families and all wanted the opportunity to 'sculpt' their family. The session proved very revealing and as members talked and described the meanings behind these forms and it proved very therapeutic. As the session ended, members shared their plans for the upcoming weekend.   Summary: The patient reported she is feeling good about her recovery. She admitted she is struggling with her children to return to parental duties so her younger children can let go of any responsibilities that are not appropriate for them. The patient also admitted she is trying to determine what to do with her 71 yo son who continues to display irresponsible behaviors, but for whom she is paying insurance and it is in her name. the patient provided good feedback to her fellow group members and seems to make excellent comments that are very accurate. She is also willing to disclose her own weaknesses before pointing out any one else's weaknesses. The patient  reported she had benefited greatly from the family sculpture she competed on Wednesday and served as a family member for a number of the sculptures today. She continues to make excellent progress and her sobriety date is 6/5.   Family Program: Family present? No   Name of family member(s):   UDS collected: No Results:  AA/NA attended?: YesWednesday, Thursday and Friday  Sponsor?: Yes   Tyris Eliot, LCAS

## 2012-10-09 ENCOUNTER — Other Ambulatory Visit (HOSPITAL_COMMUNITY): Payer: PRIVATE HEALTH INSURANCE | Admitting: Psychology

## 2012-10-10 ENCOUNTER — Telehealth (HOSPITAL_COMMUNITY): Payer: Self-pay | Admitting: Psychology

## 2012-10-10 ENCOUNTER — Encounter (HOSPITAL_COMMUNITY): Payer: Self-pay | Admitting: Psychology

## 2012-10-10 NOTE — Progress Notes (Unsigned)
    Daily Group Progress Note  Program: CD-IOP   Group Time: 1-2:30 pm  Participation Level: Active  Behavioral Response: Sharing  Type of Therapy: Process Group  Topic: Feelings: Why they are so Important. The second half of group was spent in a psycho-ed session on the topic of feelings. Members were provided a handout that identified why feelings are so important. Members were open about the struggles with feelings and how difficult they are in early recovery. They were asked to identify what they were feeling, but many still appear frustrated or uncertain whey they are feeling as they do. This will be an ongoing process they will deal with in early recovery.  Group Time: 2:45- 4pm  Participation Level: None  Behavioral Response: none  Type of Therapy: Psycho-education Group  Topic: Feelings: Why they are so Important. The second half of group was spent in a psycho-ed session on the topic of feelings. Members were provided a handout that identified why feelings are so important. Members were open about the struggles with feelings and how difficult they are in early recovery. They were asked to identify what they were feeling, but many still appear frustrated or uncertain whey they are feeling as they do. This will be an ongoing process they will deal with in early recovery.  Summary: the patient arrived late for group today. She reported she had a good weekend and attended meetings everyday. She reported she had written out a behavioral contract for all of her children and they had to sign it. They were resistant at first, but she agreed that they will probably really like it once they know the rules and what is acceptable and what isn't acceptable. She reported she had 2 interviews scheduled, including one this afternoon. She would have to leave at break. I asked her about the characteristics that she will emphasize during her interview and she agreed that being dependable, on time, a  team player, and being able to multi-task were all things she could do. The group wished the patient well and she left at the break. Because she was not her for at least half of the group, she will not be billed for this session.    Family Program: Family present? No   Name of family member(s):   UDS collected: Yes Results: not back yet  AA/NA attended?: YesFriday, Saturday and Sunday  Sponsor?: Yes   Shynia Daleo, LCAS

## 2012-10-11 ENCOUNTER — Other Ambulatory Visit (HOSPITAL_COMMUNITY): Payer: PRIVATE HEALTH INSURANCE | Admitting: Psychology

## 2012-10-11 DIAGNOSIS — F1120 Opioid dependence, uncomplicated: Secondary | ICD-10-CM

## 2012-10-11 DIAGNOSIS — F1020 Alcohol dependence, uncomplicated: Secondary | ICD-10-CM

## 2012-10-12 ENCOUNTER — Encounter (HOSPITAL_COMMUNITY): Payer: Self-pay | Admitting: Dietician

## 2012-10-12 NOTE — Progress Notes (Signed)
[facility] Diabetes Class Completion  Date:October 12, 2012  Time: 1730  Pt attended  Hospital[facility] Diabetes Group Education Class on October 12, 2012.   Patient was educated on the following topics:   -Survival skills (signs and symptoms of hyperglycemia and hypoglycemia, treatment for hypoglycemia, ideal levels for fasting and postprandial blood sugars, goal Hgb A1c level, foot care basics)  -Recommendations for physical activity   -Carbohydrate metabolism in relation to diabetes   -Meal planning (sources of carbohydrate, carbohydrate counting, meal planning strategies, food label reading, and portion control).  Handouts provided:  -"Diabetes and You: Taking Charge of Your Health"  -"Carbohydrate Counting and Meal Planning"  -"Blood Sugar Diary"  -"Your Guide to Better Office Visits"   Tsering Leaman A. Preet Perrier, RD, LDN   

## 2012-10-13 ENCOUNTER — Other Ambulatory Visit (HOSPITAL_COMMUNITY): Payer: PRIVATE HEALTH INSURANCE

## 2012-10-15 ENCOUNTER — Encounter (HOSPITAL_COMMUNITY): Payer: Self-pay | Admitting: Psychology

## 2012-10-15 NOTE — Progress Notes (Signed)
    Daily Group Progress Note  Program: CD-IOP   Group Time: 1-2:30 pm  Participation Level: Active  Behavioral Response: Appropriate and Sharing  Type of Therapy: Psycho-education Group  Topic: Visit with the Chaplain: after a brief check-in, the group welcomed a visit from [employer], the chaplain at the [facility]. After introductions, the chaplain invited a discussion about "Hope". She invited group members to share what they hope for, especially what they hope to gain from treatment. There was good disclosure among members. The chaplain challenged members to identify the difference between hoping and wishing? The importance of being "pro-active" or working towards hope was emphasized.     Group Time: 2:45- 4pm  Participation Level: Active  Behavioral Response: Sharing  Type of Therapy: Process Group  Topic: Matrix/Introductions: a brief presentation utilizing the Matrix treatment program was presented in the first part of this half of group. A slide show was presented that highlighted the development through of addiction through reinforcement. The way triggers and cravings first develop and then grow in strength. Two new members were present today and they were invited to tell their new group members a little bit about themselves and what they hoped to get from the group. There was good feedback and support with members inviting the new members to join them this evening at some specific 12-step meetings.    Summary: The patient reported she is doing well and feels good about herself. She shared with the chaplain that she hopes that through this program she will remain sober and become the parent to her children she has not been in years past. The patient agreed that nothing will come to her if she doesn't made every effort to change her life. She agreed that wishing does little for her. In process, the patient reported she recognizes how powerful the triggers are in her  life and that she really has to be careful about where she goes and who she spends time with. The patient continues to make excellent progress in her recovery and her sobriety date is 6/5.    Family Program: Family present? No   Name of family member(s):   UDS collected: Yes Results: negative  AA/NA attended?: YesMonday and Tuesday  Sponsor?: Yes   Lucero Ide, LCAS

## 2012-10-16 ENCOUNTER — Other Ambulatory Visit (HOSPITAL_COMMUNITY): Payer: PRIVATE HEALTH INSURANCE | Admitting: Psychology

## 2012-10-16 DIAGNOSIS — F1120 Opioid dependence, uncomplicated: Secondary | ICD-10-CM

## 2012-10-16 DIAGNOSIS — F1020 Alcohol dependence, uncomplicated: Secondary | ICD-10-CM

## 2012-10-17 ENCOUNTER — Encounter (HOSPITAL_COMMUNITY): Payer: Self-pay | Admitting: Psychology

## 2012-10-17 NOTE — Progress Notes (Signed)
    Daily Group Progress Note  Program: CD-IOP   Group Time: 1-2:30 pm  Participation Level: Active  Behavioral Response: Appropriate and Sharing  Type of Therapy: Process Group  Topic: Group Process: the first part of group was spent in process. Two students from the PA program at [facility] were also in the group. Members checked-in and described what they had done over the weekend that supported their recovery. Many had attended a number of meetings. One member expressed her need to share what she was feeling from the session on Friday. She and I had had a confrontation of sorts after I requested that she put away her cell phone. In the 7+ sessions she has attended, it is safe to say that she has been asked 6-7 times to put away her phone. The patient expressed her anger over this incident and admitted she had thought about it over and over through the course of the weekend. The remainder of the session was spent with the group processing their experience on Friday and their feelings today. The session proved very intense and, ultimately, therapeutic for all of the members.    Group Time: 2:45- 4pm  Participation Level: Active  Behavioral Response: Appropriate and Sharing  Type of Therapy: Psycho-education Group  Topic: "The Wheel of Life": the second half of group was spent in a psycho-ed session . Members were provided a handout entitled, "The Wheel of Life". they were asked to chart where they were relative to the eight different sections. The session invited members to share about the balance in their lives and what they might consider doing to make changes. members presented their wheels on the board and explained why they had charted them this way. Group members provided good feedback and the session proved effective and therapeutic.   Summary: The patient shared that her weekend had been chaotic with her children pushing their recently established behaviors through a  behavioral contract. The patient noted that her sister is an addict and in her active addiction to sex at this time. I noted as did another member, that this patient needs to write a book. The patient admitted someone had told her she should be on the "[employer]". The patient provided good feedback, but backed up another member who expressed concerns about this young woman and the fact that she is here because of her parents and not because she believes she has a problem. The patient shared good news about a part-time job that will soon be available once she passes all of the tests. The group applauded this news. Bethany Barton made some excellent comments and continues to do well and serve as a wonderful role model for her fellow group members. Her sobriety date remains 6/5.   Family Program: Family present? No   Name of family member(s):   UDS collected: No Results:   AA/NA attended?: YesFriday, Saturday and Sunday  Sponsor?: Yes   Cigi Bega, LCAS

## 2012-10-18 ENCOUNTER — Other Ambulatory Visit (HOSPITAL_COMMUNITY): Payer: PRIVATE HEALTH INSURANCE | Admitting: Psychology

## 2012-10-18 DIAGNOSIS — F10229 Alcohol dependence with intoxication, unspecified: Secondary | ICD-10-CM

## 2012-10-18 DIAGNOSIS — F1123 Opioid dependence with withdrawal: Secondary | ICD-10-CM

## 2012-10-20 ENCOUNTER — Other Ambulatory Visit (HOSPITAL_COMMUNITY): Payer: PRIVATE HEALTH INSURANCE

## 2012-10-23 ENCOUNTER — Other Ambulatory Visit (HOSPITAL_COMMUNITY): Payer: PRIVATE HEALTH INSURANCE | Admitting: Psychology

## 2012-10-23 ENCOUNTER — Other Ambulatory Visit (HOSPITAL_COMMUNITY): Payer: Self-pay | Admitting: Physician Assistant

## 2012-10-23 ENCOUNTER — Encounter (HOSPITAL_COMMUNITY): Payer: Self-pay | Admitting: Psychology

## 2012-10-23 DIAGNOSIS — F1020 Alcohol dependence, uncomplicated: Secondary | ICD-10-CM

## 2012-10-23 DIAGNOSIS — F1120 Opioid dependence, uncomplicated: Secondary | ICD-10-CM

## 2012-10-23 NOTE — Progress Notes (Signed)
    Daily Group Progress Note  Program: CD-IOP   Group Time: 1-2:30 pm  Participation Level: Active  Behavioral Response: Appropriate and Sharing  Type of Therapy: Psycho-education Group  Topic: Stages of Change: After completing a check-in, a psycho-ed piece was presented on "The Stages of Change'. Members were provided a handout that identified the stages of change. A brief explanation of the stages was provided and the importance of moving back and forth as well as the time frame of change emphasized. Members were invited to identify where they felt they were in the stages of change. They were also encouraged to identify what they would do if they fell out or relapsed and how they return to the stage they needed to be. The session proved very effective and members responded well to this concept of change as a process.   Group Time: 2:45- 4pm  Participation Level: Active  Behavioral Response: Sharing  Type of Therapy: Process Group  Topic: Group Process: The second part of group was spent in process. Members shared about current issues and concerns. There was good feedback and discussion and members seemed open and willing to discuss what they were struggling with at this time. The session ended with a tension-breaker and an opportunity to learn more about each other and the unknown strengths, talents, and experiences each of the group member brings to the table. This session provided good insight, encouraged openness that proved very safe, and there was good feedback and validation among group members.   Summary: The patient reported she is doing well and is very solid in the action stage of change. She has made dramatic changes in her own life as well as the lives of her children and is enjoying life for the first time in a long time. The patient pointed out she will begin starting work again in the near future after staying out to address her addiction. In the process part of group,  she congratulated the member who is going to volunteer at the shelter and reported she had volunteered at the [employer] when she was struggling and before she got here to this program. They had been very kind to her and her family and really helped them during some difficult times. She has made tremendous progress since she returned to total abstinence and expressed her gratitude to everyone that has helped, including this group. The patient's sobriety date remains 6/5.    Family Program: Family present? No   Name of family member(s):   UDS collected: No Results:  AA/NA attended?: YesMonday and Tuesday  Sponsor?: Yes   Harpreet Signore, LCAS

## 2012-10-24 ENCOUNTER — Encounter (HOSPITAL_COMMUNITY): Payer: Self-pay | Admitting: Psychology

## 2012-10-24 NOTE — Progress Notes (Signed)
    Daily Group Progress Note  Program: CD-IOP   Group Time: 1-2:30 pm  Participation Level: Active  Behavioral Response: Appropriate and Sharing  Type of Therapy: Process Group  Topic: Group Process: the first part of group was spent in process members shared about their days in early recovery since the last group session. Group had been cancelled on Friday due to energy problems. Two new members were also present and they received a warm welcome. A number of members shared about putting themselves in high risk situations and those were processed by members. Halfway through this session, because of continued problems with the [facility], group moved out into the courtyard under the covered shelter. It was cooler and there was a delightful breeze. The remainder of this session was spent outside.   Group Time: 2:45- 4pm  Participation Level: Active  Behavioral Response: Appropriate and Sharing  Type of Therapy: Psycho-education Group  Topic: Dopamine Deficiency/Graduation: the second half of group was spent in a review of the dopamine deficit identified within the brains of addicts and alcoholics. Addiction develops out of the search to find whatever makes that individual feel whole again. This is most easily experienced through drugs and alcohol. Members shared the struggles they have had and many expressed frustration over fellow group members and their willingness to put themselves in high risk situations. As the session neared an end, a graduation was held for a member who was leaving the program having completed successfully. Kind words and plenty of hope were shared and the graduating member expressed his appreciation to his fellow group members and encouraged them to take advantage of this experience.     Summary: The patient reported she had had a good weekend and went to a lot of meetings. 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 The group applauded this  news and I pointed out how 3 months ago her life had been total chaos and addiction and she would be starting a new job in a month. She agreed that things had changed dramatically since she had gotten sober and things were really incredible for her. The patient provided good feedback to her fellow group members and encouragement to the two new members. The patient validated the problems with triggers and described how she had experienced physiological cravings long after she had stopped using. She has worked hard to eliminate all the triggers she possibly can. The patient had kind words for the graduating member and intended to remain in contact through meetings. She continues to make excellent progress with a sobriety date of 6/5.  Family Program: Family present? No   Name of family member(s):   UDS collected: Yes Results: not back yet  AA/NA attended?: YesThursday, Friday, Saturday and Sunday  Sponsor?: Yes   Javaeh Muscatello, LCAS

## 2012-10-25 ENCOUNTER — Other Ambulatory Visit (HOSPITAL_COMMUNITY): Payer: PRIVATE HEALTH INSURANCE | Admitting: Psychology

## 2012-10-25 DIAGNOSIS — F1120 Opioid dependence, uncomplicated: Secondary | ICD-10-CM

## 2012-10-25 DIAGNOSIS — F1020 Alcohol dependence, uncomplicated: Secondary | ICD-10-CM

## 2012-10-27 ENCOUNTER — Encounter (HOSPITAL_COMMUNITY): Payer: Self-pay

## 2012-10-27 ENCOUNTER — Encounter (HOSPITAL_COMMUNITY): Payer: Self-pay | Admitting: Psychology

## 2012-10-27 NOTE — Progress Notes (Signed)
    Daily Group Progress Note  Program: CD-IOP   Group Time: 1-2:30 pm  Participation Level: Active  Behavioral Response: Appropriate and Sharing  Type of Therapy: Psycho-education Group  Topic: Codependency: The first part of group was spent in a presentation about Codependency. A guest facilitator, Cameron Ali, was the presenter. She provided handouts and the group completed a questionnaire. Members were invited to volunteer some of their answers to the questions. A good discussion ensued and members provided good feedback about their own experiences in relationships and some of the dysfunctional behaviors that they had engaged in during those relationships. The significance of parents or grandparents and how their addictions have impacted these group members was also discussed at length. The discussion was compelling with honest sharing among the group.     Group Time: 2:45- 4pm Participation Level: Active  Behavioral Response: Sharing  Type of Therapy: Process Group  Topic: Group process: the second half of group was spent in process. Members shared about current issues and concerns in early recovery. The struggles so common, especially PAWS, were discussed and the new group members reminded they will meet with the medical director on Friday. As the group concluded, members identified what meetings they intended to go to this evening and they provided good suggestions to one of the new members who is going to his first meeting tonight.    Summary: The patient reported she is doing well. She laughed during the discussion on codependency and admitted she could have checked off many of the examples identified on the handout. She admitted she and her current S/O have had some struggles, but there is much more openness and not any of the manipulation that she might have experienced in previous relationships. The patient reported they are working hard to keep their children in line and  behaving properly and to be honest and on the same page themselves. The patient reported she is excited about her new upcoming job, but admitted she has loved being in this group and hates to leave. I pointed out to her fellow group members that she will be graduating on Friday and we will have brownies and celebrate her success in recovery, ongoing sobriety and the promise of a new job. The patient has done extremely well in her recovery and been a very grounding leader in this group. She will be leaving on Friday with the same sobriety date she arrived with - 6/5.  Family Program: Family present? NA   Name of family member(s):   UDS collected: No Results:   AA/NA attended?: YesMonday, Tuesday and Wednesday  Sponsor?: Yes   Earlee Herald, LCAS

## 2012-11-01 ENCOUNTER — Encounter (HOSPITAL_COMMUNITY): Payer: Self-pay

## 2012-11-03 ENCOUNTER — Encounter (HOSPITAL_COMMUNITY): Payer: Self-pay

## 2012-11-06 ENCOUNTER — Encounter (HOSPITAL_COMMUNITY): Payer: Self-pay

## 2012-11-07 ENCOUNTER — Other Ambulatory Visit (HOSPITAL_COMMUNITY): Payer: Self-pay | Admitting: Physician Assistant

## 2012-11-07 MED ORDER — GABAPENTIN 400 MG PO CAPS: ORAL_CAPSULE | ORAL | Status: DC

## 2012-11-08 ENCOUNTER — Other Ambulatory Visit (HOSPITAL_COMMUNITY): Payer: Self-pay | Admitting: Physician Assistant

## 2012-11-12 ENCOUNTER — Emergency Department (HOSPITAL_COMMUNITY)
Admission: EM | Admit: 2012-11-12 | Discharge: 2012-11-12 | Disposition: A | Discharge status: Home / Self Care | Payer: No Typology Code available for payment source | Attending: Emergency Medicine | Admitting: Emergency Medicine

## 2012-11-12 ENCOUNTER — Encounter (HOSPITAL_COMMUNITY): Payer: Self-pay | Admitting: *Deleted

## 2012-11-12 DIAGNOSIS — Y849 Medical procedure, unspecified as the cause of abnormal reaction of the patient, or of later complication, without mention of misadventure at the time of the procedure: Secondary | ICD-10-CM | POA: Insufficient documentation

## 2012-11-12 DIAGNOSIS — G609 Hereditary and idiopathic neuropathy, unspecified: Secondary | ICD-10-CM | POA: Insufficient documentation

## 2012-11-12 DIAGNOSIS — IMO0002 Reserved for concepts with insufficient information to code with codable children: Secondary | ICD-10-CM | POA: Insufficient documentation

## 2012-11-12 DIAGNOSIS — Z8659 Personal history of other mental and behavioral disorders: Secondary | ICD-10-CM | POA: Insufficient documentation

## 2012-11-12 DIAGNOSIS — Z79899 Other long term (current) drug therapy: Secondary | ICD-10-CM | POA: Insufficient documentation

## 2012-11-12 DIAGNOSIS — T792XXA Traumatic secondary and recurrent hemorrhage and seroma, initial encounter: Secondary | ICD-10-CM

## 2012-11-12 DIAGNOSIS — Z9884 Bariatric surgery status: Secondary | ICD-10-CM | POA: Insufficient documentation

## 2012-11-12 DIAGNOSIS — I10 Essential (primary) hypertension: Secondary | ICD-10-CM | POA: Insufficient documentation

## 2012-11-12 LAB — PROTIME-INR
INR: 0.91 (ref 0.00–1.49)
Prothrombin Time: 12.1 seconds (ref 11.6–15.2)

## 2012-11-12 LAB — CBC WITH DIFFERENTIAL/PLATELET
Band Neutrophils: 0 % (ref 0–10)
Blasts: 0 %
Eosinophils Absolute: 0 10*3/uL (ref 0.0–0.7)
HCT: 35.3 % — ABNORMAL LOW (ref 36.0–46.0)
MCH: 30.3 pg (ref 26.0–34.0)
MCHC: 34.8 g/dL (ref 30.0–36.0)
MCV: 86.9 fL (ref 78.0–100.0)
Metamyelocytes Relative: 0 %
Monocytes Absolute: 0.4 10*3/uL (ref 0.1–1.0)
Monocytes Relative: 4 % (ref 3–12)
Myelocytes: 0 %
Platelets: 247 10*3/uL (ref 150–400)
RDW: 12.2 % (ref 11.5–15.5)
nRBC: 0 /100 WBC

## 2012-11-12 LAB — APTT: aPTT: 31 seconds (ref 24–37)

## 2012-11-12 MED ORDER — "THROMBI-PAD 3""X3"" EX PADS": 1.0000 | MEDICATED_PAD | Freq: Once | CUTANEOUS | Status: DC

## 2012-11-12 MED ORDER — LIDOCAINE HCL (PF) 2 % IJ SOLN
10.0000 mL | Freq: Once | INTRAMUSCULAR | Status: AC
  Administered 2012-11-12: 10 mL
  Filled 2012-11-12: qty 10

## 2012-11-12 NOTE — ED Notes (Signed)
Pt received 1 stitch in right lower leg. Bleeding stopped at this time and pt was discharged.

## 2012-11-12 NOTE — ED Notes (Signed)
Pt reports she had a nerve bioposy yesterday, since that time the area has been bleeding.  Pressure dressing in place at this time.  Oozing through dressing.

## 2012-11-12 NOTE — ED Notes (Signed)
Placed quick-clot on pts right lower leg. Bleeding seems to have stopped right now.

## 2012-11-12 NOTE — ED Provider Notes (Signed)
CSN: [id number]     Arrival date & time 11/12/12  0112 History   First MD Initiated Contact with Patient 11/12/12 [phone]     Chief Complaint  Patient presents with  . Open Wound   (Consider location/radiation/quality/duration/timing/severity/associated sxs/prior Treatment) The history is provided by the patient.   42 year old female had nerve biopsy is done under her right leg yesterday. The biopsy site in her right thigh has not had any problem, but the biopsy site in her right calf has continued to bleed since then. She's tried applying pressure without benefit. Patient denies taking any anticoagulants or antiplatelet agents and has not had bleeding problems previously. She has not having any pain in the site other than her chronic neuropathic pain.  Past Medical History  Diagnosis Date  . Hypertension   . Peripheral neuropathy   . Facial tic    Past Surgical History  Procedure Laterality Date  . 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 Abdominal hysterectomy  2013  . Ankle fusion Right 1991  . Appendectomy  1987  . Cesarean section  1997  . Abdominoplasty  2013   Family History  Problem Relation Age of Onset  . Bipolar disorder Sister   . Drug abuse Sister   . Schizophrenia Son   . Bipolar disorder Father   . Alcohol abuse Father   . Drug abuse Mother   . Alcohol abuse Mother    History  Substance Use Topics  . Smoking status: Never Smoker   . Smokeless tobacco: Not on file  . Alcohol Use: 4.8 oz/week    8 Glasses of wine per week     Comment: history of cocaine and narcotic dependence   OB History   Grav Para Term Preterm Abortions TAB SAB Ect Mult Living                 Review of Systems  All other systems reviewed and are negative.    Allergies  Iodine; Neomycin; and Phenergan  Home Medications   Current Outpatient Rx  Name  Route  Sig  Dispense  Refill  . pregabalin (LYRICA) 75 MG capsule   Oral   Take 75 mg by mouth 3 (three)  times daily.         . cloNIDine (CATAPRES) 0.1 MG tablet      take 1 tablet by mouth twice a day   60 tablet   0   . FLUoxetine (PROZAC) 20 MG capsule      take 1 capsule by mouth once daily   90 capsule   0   . gabapentin (NEURONTIN) 400 MG capsule      Take one capsule 6 times daily   540 capsule   0   . lisinopril (PRINIVIL,ZESTRIL) 30 MG tablet      take 1 tablet by mouth once daily   30 tablet   0    BP 147/94  Pulse 93  Temp(Src) 97.8 F (36.6 C) (Oral)  Resp 20  Ht 5\' 4"  (1.626 m)  Wt 160 lb (72.576 kg)  BMI 27.45 kg/m2  SpO2 97% Physical Exam  Nursing note and vitals reviewed.  42 year old female, resting comfortably and in no acute distress. Vital signs are significant for hypertension with blood pressure 147/94. Oxygen saturation is 97%, which is normal. Head is normocephalic and atraumatic. PERRLA, EOMI. Oropharynx is clear. Neck is nontender and supple without adenopathy or JVD. Back is nontender and  there is no CVA tenderness. Lungs are clear without rales, wheezes, or rhonchi. Chest is nontender. Heart has regular rate and rhythm without murmur. Abdomen is soft, flat, nontender without masses or hepatosplenomegaly and peristalsis is normoactive. Extremities have no cyanosis or edema, full range of motion is present. Biopsy site in the right thigh it appears to be healing well. There is a puncture site in the right lower leg which is apparently the side of nerve biopsy and there is a steady oozing of blood from this. Skin is warm and dry without rash. Neurologic: Mental status is normal, cranial nerves are intact, there are no motor or sensory deficits.  ED Course  Procedures (including critical care time)  Because of persistent bleeding from nerve biopsy site, and failure to respond to quick clot, it was elected to proceed with figure-of-eight suture ligature. Informed consent was obtained from the patient. The site was marked. Prior to  procedure, a timeout was held. Patient was identified verbally and by hospital applied arm band. After local anesthesia with 2% lidocaine, a figure-of-eight suture was placed around the puncture site with a 4-0 Prolene with good control of bleeding. She was observed for 20 minutes with no recurrence of bleeding. Labs Review Results for orders placed during the hospital encounter of 11/12/12  CBC WITH DIFFERENTIAL      Result Value Range   WBC 9.0  4.0 - 10.5 K/uL   RBC 4.06  3.87 - 5.11 MIL/uL   Hemoglobin 12.3  12.0 - 15.0 g/dL   HCT 40.9 (*) 81.1 - 91.4 %   MCV 86.9  78.0 - 100.0 fL   MCH 30.3  26.0 - 34.0 pg   MCHC 34.8  30.0 - 36.0 g/dL   RDW 78.2  95.6 - 21.3 %   Platelets 247  150 - 400 K/uL   Neutrophils Relative % 71  43 - 77 %   Lymphocytes Relative 25  12 - 46 %   Monocytes Relative 4  3 - 12 %   Eosinophils Relative 0  0 - 5 %   Basophils Relative 0  0 - 1 %   Band Neutrophils 0  0 - 10 %   Metamyelocytes Relative 0     Myelocytes 0     Promyelocytes Absolute 0     Blasts 0     nRBC 0  0 /100 WBC   Neutro Abs 6.3  1.7 - 7.7 K/uL   Lymphs Abs 2.3  0.7 - 4.0 K/uL   Monocytes Absolute 0.4  0.1 - 1.0 K/uL   Eosinophils Absolute 0.0  0.0 - 0.7 K/uL   Basophils Absolute 0.0  0.0 - 0.1 K/uL  PROTIME-INR      Result Value Range   Prothrombin Time 12.1  11.6 - 15.2 seconds   INR 0.91  0.00 - 1.49  APTT      Result Value Range   aPTT 31  24 - 37 seconds    MDM   1. Bleeding from wound, initial encounter    Bleeding post nerve biopsy. She will have him CBC done to make sure she does not have thrombus cytopenia, and coagulation studies will be checked. Will attempt to stop bleeding using from quick clot and if this is not effective, will place a figure-of-eight suture.  Bleeding persisted after application of quick clot so figure-of-eight suture was placed with good control of bleeding. Laboratory workup is unremarkable. She'll be discharged with instructions of sutures  removed in 2  days.  Dione Booze, MD 11/12/12 [phone]

## 2012-12-19 ENCOUNTER — Ambulatory Visit (HOSPITAL_COMMUNITY): Payer: Self-pay | Admitting: Physician Assistant

## 2013-02-05 ENCOUNTER — Ambulatory Visit (HOSPITAL_COMMUNITY): Payer: Self-pay | Admitting: Psychiatry

## 2013-04-09 ENCOUNTER — Ambulatory Visit: Payer: Self-pay | Admitting: Dietician

## 2013-04-28 ENCOUNTER — Encounter (HOSPITAL_COMMUNITY): Payer: Self-pay | Admitting: Emergency Medicine

## 2013-04-28 ENCOUNTER — Emergency Department (HOSPITAL_COMMUNITY)
Admission: EM | Admit: 2013-04-28 | Discharge: 2013-04-28 | Disposition: A | Discharge status: Home / Self Care | Payer: 59 | Attending: Emergency Medicine | Admitting: Emergency Medicine

## 2013-04-28 DIAGNOSIS — H9210 Otorrhea, unspecified ear: Secondary | ICD-10-CM | POA: Insufficient documentation

## 2013-04-28 DIAGNOSIS — E860 Dehydration: Secondary | ICD-10-CM | POA: Insufficient documentation

## 2013-04-28 DIAGNOSIS — I10 Essential (primary) hypertension: Secondary | ICD-10-CM | POA: Insufficient documentation

## 2013-04-28 DIAGNOSIS — J329 Chronic sinusitis, unspecified: Secondary | ICD-10-CM | POA: Insufficient documentation

## 2013-04-28 DIAGNOSIS — Z79899 Other long term (current) drug therapy: Secondary | ICD-10-CM | POA: Insufficient documentation

## 2013-04-28 DIAGNOSIS — H9203 Otalgia, bilateral: Secondary | ICD-10-CM

## 2013-04-28 DIAGNOSIS — F3289 Other specified depressive episodes: Secondary | ICD-10-CM | POA: Insufficient documentation

## 2013-04-28 DIAGNOSIS — E1142 Type 2 diabetes mellitus with diabetic polyneuropathy: Secondary | ICD-10-CM | POA: Insufficient documentation

## 2013-04-28 DIAGNOSIS — R42 Dizziness and giddiness: Secondary | ICD-10-CM | POA: Insufficient documentation

## 2013-04-28 DIAGNOSIS — E1149 Type 2 diabetes mellitus with other diabetic neurological complication: Secondary | ICD-10-CM | POA: Insufficient documentation

## 2013-04-28 DIAGNOSIS — F329 Major depressive disorder, single episode, unspecified: Secondary | ICD-10-CM | POA: Insufficient documentation

## 2013-04-28 DIAGNOSIS — H9209 Otalgia, unspecified ear: Secondary | ICD-10-CM | POA: Insufficient documentation

## 2013-04-28 DIAGNOSIS — R112 Nausea with vomiting, unspecified: Secondary | ICD-10-CM | POA: Insufficient documentation

## 2013-04-28 HISTORY — DX: Major depressive disorder, single episode, unspecified: F32.9

## 2013-04-28 HISTORY — DX: Depression, unspecified: F32.A

## 2013-04-28 MED ORDER — ANTIPYRINE-BENZOCAINE 5.4-1.4 % OT SOLN: 3.0000 [drp] | OTIC | Status: DC | PRN

## 2013-04-28 MED ORDER — SODIUM CHLORIDE 0.9 % IV SOLN: 1000.0000 mL | INTRAVENOUS | Status: DC

## 2013-04-28 MED ORDER — KETOROLAC TROMETHAMINE 30 MG/ML IJ SOLN
30.0000 mg | Freq: Once | INTRAMUSCULAR | Status: AC
  Administered 2013-04-28: 30 mg via INTRAVENOUS
  Filled 2013-04-28: qty 1

## 2013-04-28 MED ORDER — DEXTROSE 5 % IV SOLN
1.0000 g | Freq: Once | INTRAVENOUS | Status: AC
Start: 2013-04-28 — End: 2013-04-28
  Administered 2013-04-28: 1 g via INTRAVENOUS
  Filled 2013-04-28: qty 10

## 2013-04-28 MED ORDER — ONDANSETRON 4 MG PO TBDP: 4.0000 mg | ORAL_TABLET | Freq: Three times a day (TID) | ORAL | Status: DC | PRN

## 2013-04-28 MED ORDER — ONDANSETRON HCL 4 MG/2ML IJ SOLN
4.0000 mg | Freq: Once | INTRAMUSCULAR | Status: AC
  Administered 2013-04-28: 4 mg via INTRAMUSCULAR
  Filled 2013-04-28: qty 2

## 2013-04-28 MED ORDER — NAPROXEN SODIUM 550 MG PO TABS: 550.0000 mg | ORAL_TABLET | Freq: Two times a day (BID) | ORAL | Status: DC

## 2013-04-28 MED ORDER — AMOXICILLIN-POT CLAVULANATE 875-125 MG PO TABS: 1.0000 | ORAL_TABLET | Freq: Two times a day (BID) | ORAL | Status: DC

## 2013-04-28 MED ORDER — SODIUM CHLORIDE 0.9 % IV SOLN
1000.0000 mL | Freq: Once | INTRAVENOUS | Status: AC
  Administered 2013-04-28: 1000 mL via INTRAVENOUS

## 2013-04-28 NOTE — ED Provider Notes (Signed)
CSN: [id number]     Arrival date & time 04/28/13  1345 History   This chart was scribed for Ward GivensIva L Estle Huguley, MD by Quintella ReichertMatthew Underwood, ED scribe.  This patient was seen in room APA11/APA11 and the patient's care was started at 2:25 PM.    Chief Complaint  Patient presents with  . Otalgia    The history is provided by the patient. No language interpreter was used.    HPI Comments: Bethany Barton is a 43 y.o. female with h/o HTN who presents to the Emergency Department complaining of 4 days of persistent bilateral ear pain with associated nasal congestion, facial pressure, rhinorrhea and sneezing.  Pt reports her pain is worse in her left ear.  She has also noticed drainage from both ears.  She states her rhinorrhea has gone from clear to greenish.  In addition she complains of mild dizziness.  She has also had chills off-and-on for the past 3 days and awoke last night with "hot sweats."  She did not take her temperature at home and ED temperature is 98.3 F.  She states she has been nauseated and was vomiting until yesterday.  She has been able to tolerate Ginger Ale today.  She denies hearing loss, cough, diarrhea, CP, or SOB.  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  She admits to h/o diabetic peripheral neuropathy, gastric bypass and hysterectomy.  She has previous h/o DM but has been off of DM medications since her gastric bypass.  She does not smoke.  Pt has no PCP Neurologist Dr Gerilyn Pilgrimoonquah  Past Medical History  Diagnosis Date  . Hypertension   . Peripheral neuropathy   . Facial tic   . Depression     Past Surgical History  Procedure Laterality Date  . 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 Abdominal hysterectomy  2013  . Ankle fusion Right 1991  . Appendectomy  1987  . Cesarean section  1997  . Abdominoplasty  2013    Family History  Problem Relation Age of Onset  . Bipolar  disorder Sister   . Drug abuse Sister   . Schizophrenia Son   . Bipolar disorder Father   . Alcohol abuse Father   . Drug abuse Mother   . Alcohol abuse Mother     History  Substance Use Topics  . Smoking status: Never Smoker   . Smokeless tobacco: Not on file  . Alcohol Use: 4.8 oz/week    8 Glasses of wine per week     Comment: history of cocaine and narcotic dependence  employed on Pediatric wing at [employer]  OB History   Grav Para Term Preterm Abortions TAB SAB Ect Mult Living                   Review of Systems  Constitutional: Positive for chills.       Hot sweats, no measured fever  HENT: Positive for congestion, ear pain, rhinorrhea, sinus pressure and sneezing. Negative for hearing loss.   Respiratory: Negative for cough and shortness of breath.   Cardiovascular: Negative for chest pain.  Gastrointestinal: Positive for nausea and vomiting (resolved, no vomiting today). Negative for diarrhea.  Neurological: Positive for dizziness.  All other systems reviewed and are negative.      Allergies  Iodine; Neomycin; and Phenergan  Home Medications   Current Outpatient Rx  Name  Route  Sig  Dispense  Refill  . Armodafinil (NUVIGIL) 200 MG TABS   Oral   Take 200 mg by mouth daily as needed (Only takes on 12 hour shift days).         . Ascorbic Acid (VITAMIN C) 1000 MG tablet   Oral   Take 1,000 mg by mouth daily.         . cloNIDine (CATAPRES) 0.1 MG tablet   Oral   Take 0.1 mg by mouth 3 (three) times daily.         . DULoxetine (CYMBALTA) 30 MG capsule   Oral   Take 30 mg by mouth 2 (two) times daily.         Marland Kitchen gabapentin (NEURONTIN) 400 MG capsule   Oral   Take 400 mg by mouth 6 (six) times daily.         Marland Kitchen ibuprofen (ADVIL,MOTRIN) 200 MG tablet   Oral   Take 600-800 mg by mouth every 6 (six) hours as needed for moderate pain.         . IRON PO   Oral   Take 1 tablet by mouth daily.         Marland Kitchen lisinopril (PRINIVIL,ZESTRIL) 30  MG tablet   Oral   Take 30 mg by mouth 2 (two) times daily.         . Multiple Vitamin (MULTIVITAMIN WITH MINERALS) TABS tablet   Oral   Take 1 tablet by mouth daily.         . Multiple Vitamins-Minerals (ZINC PO)   Oral   Take 1 tablet by mouth daily.         . naproxen sodium (ALEVE) 220 MG tablet   Oral   Take 220-440 mg by mouth 2 (two) times daily as needed (Pain).         Marland Kitchen OVER THE COUNTER MEDICATION   Topical   Apply 1 application topically every 2 (two) hours as needed (Flu Bomb Oil. Helps to treat Flu Symptoms.).         Marland Kitchen pregabalin (LYRICA) 75 MG capsule   Oral   Take 150 mg by mouth 3 (three) times daily.          Marland Kitchen PRESCRIPTION MEDICATION   Topical   Apply 1 application topically 2 (two) times daily. Compounded Cream made to help nerve pain.          BP 155/98  Pulse 90  Temp(Src) 98.3 F (36.8 C) (Oral)  Resp 18  Ht 5\' 3"  (1.6 m)  Wt 170 lb (77.111 kg)  BMI 30.12 kg/m2  SpO2 100%  Vital signs normal    Physical Exam  Nursing note and vitals reviewed. Constitutional: She is oriented to person, place, and time. She appears well-developed and well-nourished.  Non-toxic appearance. She does not appear ill. No distress.  HENT:  Head: Normocephalic and atraumatic.  Right Ear: Hearing, tympanic membrane and ear canal normal. No drainage or swelling. Tympanic membrane is not injected and not bulging. No middle ear effusion.  Left Ear: Hearing, tympanic membrane, external ear and ear canal normal. No drainage or swelling. Tympanic membrane is not injected and not bulging.  No middle ear effusion.  Mouth/Throat: No dental abscesses or uvula swelling. No oropharyngeal exudate, posterior oropharyngeal edema or posterior oropharyngeal erythema.  Lips very dry and cracked Frontal and maxillary sinuses tender to pressure BOgginess in nasal mucosa  Eyes: Conjunctivae and EOM are normal. Pupils are equal, round, and reactive  to light.  Neck: Normal  range of motion and full passive range of motion without pain. Neck supple.  Cardiovascular: Normal rate, regular rhythm and normal heart sounds.  Exam reveals no gallop and no friction rub.   No murmur heard. Pulmonary/Chest: Effort normal and breath sounds normal. No respiratory distress. She has no wheezes. She has no rhonchi. She has no rales. She exhibits no tenderness and no crepitus.  Abdominal: Soft. Normal appearance and bowel sounds are normal. She exhibits no distension. There is no tenderness. There is no rebound and no guarding.  Musculoskeletal: Normal range of motion. She exhibits no edema and no tenderness.  Moves all extremities well.   Neurological: She is alert and oriented to person, place, and time. She has normal strength. No cranial nerve deficit.  Skin: Skin is warm, dry and intact. No rash noted. No erythema. No pallor.  Psychiatric: She has a normal mood and affect. Her speech is normal and behavior is normal. Her mood appears not anxious.    ED Course  Procedures (including critical care time)  Medications  0.9 %  sodium chloride infusion (0 mLs Intravenous Stopped 04/28/13 1619)    Followed by  0.9 %  sodium chloride infusion (not administered)  cefTRIAXone (ROCEPHIN) 1 g in dextrose 5 % 50 mL IVPB (0 g Intravenous Stopped 04/28/13 1619)  ondansetron (ZOFRAN) injection 4 mg (4 mg Intramuscular Given 04/28/13 1458)  ketorolac (TORADOL) 30 MG/ML injection 30 mg (30 mg Intravenous Given 04/28/13 1500)     DIAGNOSTIC STUDIES: Oxygen Saturation is100% on room air, adequate by my interpretation.    COORDINATION OF CARE: 2:34 PM-Discussed treatment plan which includes IV fluids, Toradol, Zofran and Rocephin with pt at bedside and pt agreed to plan.   3:42 PM-On recheck after one bag of IV fluids, pt states she feels about the same and her ears still hurt.  Lips no longer appear dry.  Pt declines a second bag of fluids.   Labs Review Labs Reviewed - No data to  display  Imaging Review No results found.   EKG Interpretation None      MDM  patient presents with bilateral ear pain and drainage of her ears however her ear canals and TMs are totally normal.She has no fluid behind her TM's today.  She has no erythema in her oropharynx that could cause referred pain. She does however have facial pressure over her frontal and maxillary sinuses. Patient is advised to use heat, she will be given ear drops for her pain. 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    Final diagnoses:  Otalgia of both ears  Sinusitis  Dehydration  Nausea and vomiting   New Prescriptions   AMOXICILLIN-CLAVULANATE (AUGMENTIN) 875-125 MG PER TABLET    Take 1 tablet by mouth every 12 (twelve) hours.   ANTIPYRINE-BENZOCAINE (AURALGAN) OTIC SOLUTION    Place 3-4 drops into both ears every 2 (two) hours as needed for ear pain.   NAPROXEN SODIUM (ANAPROX DS) 550 MG TABLET    Take 1 tablet (550 mg total) by mouth 2 (two) times daily with a meal.   ONDANSETRON (ZOFRAN ODT) 4 MG DISINTEGRATING TABLET    Take 1 tablet (4 mg total) by mouth every 8 (eight) hours as needed for nausea or vomiting.    Plan discharge   Devoria Albe, MD, FACEP   I personally performed the services  described in this documentation, which was scribed in my presence. The recorded information has been reviewed and considered.  Devoria Albe, MD, FACEP     Ward Givens, MD 04/28/13 [id number]

## 2013-04-28 NOTE — ED Notes (Addendum)
Pt states aching all over, pain to both ears (worse to left ear), nasal congestion, green in color.. Denies cough. Pt states symptoms are causing nausea. Last vomited yesterday, intermittent vomiting since Wednesday. Initial Symptoms began Tuesday. States nausea today

## 2013-04-28 NOTE — Discharge Instructions (Signed)
Heat to your face for comfort. Use the ear drops for your ear pain. Your ears looked normal including the canals. If you continue to have pain you can be rechecked by Dr Suszanne Connerseoh, a ENT who [place] an office in [place] and in [place]. Take the antibiotic until gone. Use the zofran if needed for nausea or vomiting. Recheck if you get a high fever, have uncontrolled vomiting or feel worse instead of better.

## 2013-05-16 ENCOUNTER — Ambulatory Visit: Payer: Self-pay | Admitting: Dietician

## 2013-06-02 ENCOUNTER — Emergency Department (HOSPITAL_COMMUNITY): Payer: 59

## 2013-06-02 ENCOUNTER — Inpatient Hospital Stay (HOSPITAL_COMMUNITY)
Admission: EM | Admit: 2013-06-02 | Discharge: 2013-06-04 | DRG: 603 | Disposition: A | Discharge status: Home / Self Care | Payer: 59 | Attending: Internal Medicine | Admitting: Internal Medicine

## 2013-06-02 ENCOUNTER — Encounter (HOSPITAL_COMMUNITY): Payer: Self-pay | Admitting: Emergency Medicine

## 2013-06-02 DIAGNOSIS — F1020 Alcohol dependence, uncomplicated: Secondary | ICD-10-CM

## 2013-06-02 DIAGNOSIS — F3289 Other specified depressive episodes: Secondary | ICD-10-CM | POA: Diagnosis present

## 2013-06-02 DIAGNOSIS — F1120 Opioid dependence, uncomplicated: Secondary | ICD-10-CM

## 2013-06-02 DIAGNOSIS — J3489 Other specified disorders of nose and nasal sinuses: Secondary | ICD-10-CM

## 2013-06-02 DIAGNOSIS — Z818 Family history of other mental and behavioral disorders: Secondary | ICD-10-CM

## 2013-06-02 DIAGNOSIS — Z79899 Other long term (current) drug therapy: Secondary | ICD-10-CM

## 2013-06-02 DIAGNOSIS — G609 Hereditary and idiopathic neuropathy, unspecified: Secondary | ICD-10-CM | POA: Diagnosis present

## 2013-06-02 DIAGNOSIS — F959 Tic disorder, unspecified: Secondary | ICD-10-CM

## 2013-06-02 DIAGNOSIS — G629 Polyneuropathy, unspecified: Secondary | ICD-10-CM

## 2013-06-02 DIAGNOSIS — E876 Hypokalemia: Secondary | ICD-10-CM | POA: Diagnosis present

## 2013-06-02 DIAGNOSIS — Z9884 Bariatric surgery status: Secondary | ICD-10-CM

## 2013-06-02 DIAGNOSIS — L0390 Cellulitis, unspecified: Secondary | ICD-10-CM | POA: Diagnosis present

## 2013-06-02 DIAGNOSIS — I10 Essential (primary) hypertension: Secondary | ICD-10-CM | POA: Diagnosis present

## 2013-06-02 DIAGNOSIS — J340 Abscess, furuncle and carbuncle of nose: Secondary | ICD-10-CM

## 2013-06-02 DIAGNOSIS — L03211 Cellulitis of face: Principal | ICD-10-CM | POA: Diagnosis present

## 2013-06-02 DIAGNOSIS — L0201 Cutaneous abscess of face: Principal | ICD-10-CM | POA: Diagnosis present

## 2013-06-02 DIAGNOSIS — F329 Major depressive disorder, single episode, unspecified: Secondary | ICD-10-CM | POA: Diagnosis present

## 2013-06-02 HISTORY — DX: Type 2 diabetes mellitus without complications: E11.9

## 2013-06-02 LAB — CBC WITH DIFFERENTIAL/PLATELET
BASOS ABS: 0 10*3/uL (ref 0.0–0.1)
Basophils Relative: 0 % (ref 0–1)
EOS ABS: 0.1 10*3/uL (ref 0.0–0.7)
Eosinophils Relative: 1 % (ref 0–5)
HCT: 42 % (ref 36.0–46.0)
Hemoglobin: 14.5 g/dL (ref 12.0–15.0)
Lymphocytes Relative: 14 % (ref 12–46)
Lymphs Abs: 1.7 10*3/uL (ref 0.7–4.0)
MCH: 29 pg (ref 26.0–34.0)
MCHC: 34.5 g/dL (ref 30.0–36.0)
MCV: 84 fL (ref 78.0–100.0)
Monocytes Absolute: 0.6 10*3/uL (ref 0.1–1.0)
Monocytes Relative: 5 % (ref 3–12)
NEUTROS ABS: 9.6 10*3/uL — AB (ref 1.7–7.7)
NEUTROS PCT: 79 % — AB (ref 43–77)
PLATELETS: 218 10*3/uL (ref 150–400)
RBC: 5 MIL/uL (ref 3.87–5.11)
RDW: 12.9 % (ref 11.5–15.5)
WBC: 12.1 10*3/uL — ABNORMAL HIGH (ref 4.0–10.5)

## 2013-06-02 LAB — BASIC METABOLIC PANEL
BUN: 11 mg/dL (ref 6–23)
CO2: 25 mEq/L (ref 19–32)
Calcium: 9.2 mg/dL (ref 8.4–10.5)
Chloride: 103 mEq/L (ref 96–112)
Creatinine, Ser: 0.55 mg/dL (ref 0.50–1.10)
Glucose, Bld: 139 mg/dL — ABNORMAL HIGH (ref 70–99)
Potassium: 3.3 mEq/L — ABNORMAL LOW (ref 3.7–5.3)
SODIUM: 143 meq/L (ref 137–147)

## 2013-06-02 MED ORDER — ONDANSETRON HCL 4 MG/2ML IJ SOLN
4.0000 mg | Freq: Once | INTRAMUSCULAR | Status: AC
  Administered 2013-06-02: 4 mg via INTRAVENOUS
  Filled 2013-06-02: qty 2

## 2013-06-02 MED ORDER — VANCOMYCIN HCL IN DEXTROSE 1-5 GM/200ML-% IV SOLN
1000.0000 mg | Freq: Three times a day (TID) | INTRAVENOUS | Status: DC
  Administered 2013-06-03 – 2013-06-04 (×4): 1000 mg via INTRAVENOUS
  Filled 2013-06-02 (×5): qty 200

## 2013-06-02 MED ORDER — ARMODAFINIL 200 MG PO TABS: 200.0000 mg | ORAL_TABLET | Freq: Every day | ORAL | Status: DC | PRN

## 2013-06-02 MED ORDER — LISINOPRIL 10 MG PO TABS: 30.0000 mg | ORAL_TABLET | Freq: Two times a day (BID) | ORAL | Status: DC

## 2013-06-02 MED ORDER — CLINDAMYCIN PHOSPHATE 600 MG/50ML IV SOLN
600.0000 mg | Freq: Once | INTRAVENOUS | Status: AC
  Administered 2013-06-02: 600 mg via INTRAVENOUS
  Filled 2013-06-02: qty 50

## 2013-06-02 MED ORDER — SODIUM CHLORIDE 0.9 % IV SOLN
INTRAVENOUS | Status: DC
  Administered 2013-06-02: 1000 mL via INTRAVENOUS

## 2013-06-02 MED ORDER — HYDROMORPHONE HCL PF 1 MG/ML IJ SOLN
1.0000 mg | INTRAMUSCULAR | Status: DC | PRN
  Administered 2013-06-02 – 2013-06-04 (×13): 1 mg via INTRAVENOUS
  Filled 2013-06-02 (×13): qty 1

## 2013-06-02 MED ORDER — HYDROMORPHONE HCL PF 1 MG/ML IJ SOLN
1.0000 mg | Freq: Once | INTRAMUSCULAR | Status: AC
  Administered 2013-06-02: 1 mg via INTRAVENOUS
  Filled 2013-06-02: qty 1

## 2013-06-02 MED ORDER — PREGABALIN 75 MG PO CAPS
150.0000 mg | ORAL_CAPSULE | Freq: Three times a day (TID) | ORAL | Status: DC
  Administered 2013-06-02 – 2013-06-04 (×6): 150 mg via ORAL
  Filled 2013-06-02: qty 2
  Filled 2013-06-02: qty 6
  Filled 2013-06-02 (×4): qty 2

## 2013-06-02 MED ORDER — ANTIPYRINE-BENZOCAINE 5.4-1.4 % OT SOLN: 3.0000 [drp] | OTIC | Status: DC | PRN

## 2013-06-02 MED ORDER — DULOXETINE HCL 30 MG PO CPEP
30.0000 mg | ORAL_CAPSULE | Freq: Two times a day (BID) | ORAL | Status: DC
  Administered 2013-06-02 – 2013-06-04 (×4): 30 mg via ORAL
  Filled 2013-06-02 (×4): qty 1

## 2013-06-02 MED ORDER — GABAPENTIN 400 MG PO CAPS
400.0000 mg | ORAL_CAPSULE | Freq: Every day | ORAL | Status: DC
  Administered 2013-06-02 – 2013-06-04 (×11): 400 mg via ORAL
  Filled 2013-06-02 (×11): qty 1

## 2013-06-02 MED ORDER — ONDANSETRON HCL 4 MG/2ML IJ SOLN
4.0000 mg | Freq: Four times a day (QID) | INTRAMUSCULAR | Status: DC | PRN
  Administered 2013-06-02 – 2013-06-04 (×4): 4 mg via INTRAVENOUS
  Filled 2013-06-02 (×4): qty 2

## 2013-06-02 MED ORDER — LISINOPRIL 10 MG PO TABS
30.0000 mg | ORAL_TABLET | Freq: Two times a day (BID) | ORAL | Status: DC
  Administered 2013-06-02 – 2013-06-04 (×4): 30 mg via ORAL
  Filled 2013-06-02 (×4): qty 3

## 2013-06-02 MED ORDER — ONDANSETRON HCL 4 MG PO TABS: 4.0000 mg | ORAL_TABLET | Freq: Four times a day (QID) | ORAL | Status: DC | PRN

## 2013-06-02 MED ORDER — HYDROCODONE-ACETAMINOPHEN 5-325 MG PO TABS
1.0000 | ORAL_TABLET | Freq: Once | ORAL | Status: AC
  Administered 2013-06-02: 1 via ORAL
  Filled 2013-06-02: qty 1

## 2013-06-02 MED ORDER — ENOXAPARIN SODIUM 40 MG/0.4ML ~~LOC~~ SOLN
40.0000 mg | SUBCUTANEOUS | Status: DC
  Administered 2013-06-02 – 2013-06-03 (×2): 40 mg via SUBCUTANEOUS
  Filled 2013-06-02 (×2): qty 0.4

## 2013-06-02 MED ORDER — VANCOMYCIN HCL 10 G IV SOLR
1250.0000 mg | Freq: Three times a day (TID) | INTRAVENOUS | Status: DC
  Filled 2013-06-02 (×5): qty 1250

## 2013-06-02 MED ORDER — HYDROCODONE-ACETAMINOPHEN 5-325 MG PO TABS: 1.0000 | ORAL_TABLET | ORAL | Status: DC | PRN

## 2013-06-02 MED ORDER — PIPERACILLIN-TAZOBACTAM 3.375 G IVPB
3.3750 g | Freq: Three times a day (TID) | INTRAVENOUS | Status: DC
  Administered 2013-06-02 – 2013-06-04 (×5): 3.375 g via INTRAVENOUS
  Filled 2013-06-02 (×9): qty 50

## 2013-06-02 MED ORDER — VANCOMYCIN HCL 10 G IV SOLR
1500.0000 mg | Freq: Once | INTRAVENOUS | Status: AC
  Administered 2013-06-02: 1500 mg via INTRAVENOUS
  Filled 2013-06-02: qty 1500

## 2013-06-02 MED ORDER — PIPERACILLIN-TAZOBACTAM 3.375 G IVPB
INTRAVENOUS | Status: AC
  Filled 2013-06-02: qty 100

## 2013-06-02 MED ORDER — IOHEXOL 300 MG/ML  SOLN
75.0000 mL | Freq: Once | INTRAMUSCULAR | Status: AC | PRN
  Administered 2013-06-02: 75 mL via INTRAVENOUS

## 2013-06-02 MED ORDER — CLONIDINE HCL 0.1 MG PO TABS
0.1000 mg | ORAL_TABLET | Freq: Three times a day (TID) | ORAL | Status: DC
  Administered 2013-06-02 – 2013-06-04 (×5): 0.1 mg via ORAL
  Filled 2013-06-02 (×6): qty 1

## 2013-06-02 MED ORDER — SODIUM CHLORIDE 0.9 % IV SOLN
INTRAVENOUS | Status: DC
  Administered 2013-06-02 – 2013-06-03 (×2): via INTRAVENOUS

## 2013-06-02 NOTE — ED Notes (Signed)
Dr Myers at bedside,  

## 2013-06-02 NOTE — Progress Notes (Addendum)
ANTIBIOTIC CONSULT NOTE - INITIAL  Pharmacy Consult for Vancomycin & Zosyn Indication: cellulitis  Allergies  Allergen Reactions  . Iodine Other (See Comments)    Blisters  . Neomycin Other (See Comments)    Blisters   . Phenergan [Promethazine Hcl] Other (See Comments)    Altered Mental Status    Patient Measurements: Height: 5\' 3"  (160 cm) Weight: 182 lb 12.2 oz (82.9 kg) IBW/kg (Calculated) : 52.4  Vital Signs: Temp: 98.7 F (37.1 C) (04/04 1810) Temp src: Oral (04/04 1810) BP: 176/96 mmHg (04/04 1834) Pulse Rate: 78 (04/04 1810) Intake/Output from previous day:   Intake/Output from this shift:    Labs:  Recent Labs  06/02/13 1528  WBC 12.1*  HGB 14.5  PLT 218  CREATININE 0.55   Estimated Creatinine Clearance: 95.3 ml/min (by C-G formula based on Cr of 0.55). No results found for this basename: VANCOTROUGH, VANCOPEAK, VANCORANDOM, GENTTROUGH, GENTPEAK, GENTRANDOM, TOBRATROUGH, TOBRAPEAK, TOBRARND, AMIKACINPEAK, AMIKACINTROU, AMIKACIN,  in the last 72 hours   Microbiology: No results found for this or any previous visit (from the past 720 hour(s)).  Medical History: Past Medical History  Diagnosis Date  . Hypertension   . Peripheral neuropathy   . Facial tic   . Depression   . Diabetes mellitus without complication     Medications:  Scheduled:  . cloNIDine  0.1 mg Oral TID  . DULoxetine  30 mg Oral BID  . enoxaparin (LOVENOX) injection  40 mg Subcutaneous Q24H  . gabapentin  400 mg Oral 6 X Daily  . lisinopril  30 mg Oral BID  . piperacillin-tazobactam (ZOSYN)  IV  3.375 g Intravenous Q8H  . pregabalin  150 mg Oral TID  . vancomycin  1,500 mg Intravenous Once   Followed by  . [START ON 06/03/2013] vancomycin  1,250 mg Intravenous Q8H   Assessment: 43 yo F with worsening nasal cellulitis associated with pain, redness, swelling, and purulent drainage.  WBC is elevated.  No abscess noted on CT. Renal function is at patient's baseline.    Vancomycin 4/4>> Zosyn 4/4>>  Goal of Therapy:  Vancomycin trough level 10-15 mcg/ml  Plan:  Zosyn 3.375gm IV Q8h to be infused over 4hrs Vancomycin 1500mg  IV x1 now then 1000mg  IV q8h Check Vancomycin trough at steady state Monitor renal function and cx data   Elson ClanLilliston, Tannya Gonet Michelle 06/02/2013,6:50 PM

## 2013-06-02 NOTE — ED Notes (Signed)
Report given to floor,  

## 2013-06-02 NOTE — ED Notes (Signed)
Pt given ice pack per her request,

## 2013-06-02 NOTE — ED Notes (Signed)
Pt presents with nose pain, redness with abscess-like area to right side of nose. Pt reports yellow-green drainage yesterday.

## 2013-06-02 NOTE — ED Notes (Signed)
Pt given sprite per her request, updated on plan of care,

## 2013-06-02 NOTE — ED Notes (Signed)
Pt states that she noticed a "ingrown hair" to nare first of the week, area continued to be sore, red, did take a pai of sterile scissors  and attempt to open the area, pt reports that "pus" was able to come out of the area, pt has redness, swelling, pain  noted to tip of nose area. Pain under bilateral eyes, cheekbone area,  Denies any injury,

## 2013-06-02 NOTE — H&P (Signed)
[facility] History and Physical  Bethany Barton AVW:[id number] DOB: 1970/12/29 DOA: 06/02/2013  Referring physician: ED physician PCP: No PCP Per Patient   Chief Complaint: Swelling and redness of the nose  HPI:  Patient is 44 year old female who presented to emergency department with several days duration of progressively worsening nasal pain, throbbing and constant, 5/10 in severity, no specific aggravating or alleviating factors, associated with erythema and swelling, purulent drainage, sensation as if it's draining inside. Patient also reports swelling of the lymph nodes in the neck. She reports subjective fevers and chills, malaise, poor oral intake. She explains recent otitis media, last month that was treated with Augmentin. She reports her symptoms initially improved but have now gotten worse.   In emergency department, patient is hemodynamically stable, [facility] asked to admit for further management with IV antibiotics.   Assessment and Plan: Active Problems: Cellulitis of the nose - will admit to medical floor - Maxillofacial CT consistent with cellulitis but no associated abscess - Will place on broad spectrum antibiotics vancomycin and Zosyn - Provide IV fluids, analgesia as needed - Patient can eat regular diet if tolerating - Provide antiemetics if needed Leukocytosis - Secondary to cellulitis - Antibiotics as noted above Hypokalemia - Supplemented and repeat BMP in the morning  Radiological Exams on Admission: Ct Maxillofacial W/cm  06/02/2013   CLINICAL DATA:  Nasal abscess, swelling and facial pain  EXAM: CT MAXILLOFACIAL WITH CONTRAST  TECHNIQUE: Multidetector CT imaging of the maxillofacial structures was performed with intravenous contrast. Multiplanar CT image reconstructions were also generated. A small metallic BB was placed on the right temple in order to reliably differentiate right from left.  CONTRAST:  75mL OMNIPAQUE IOHEXOL 300 MG/ML  SOLN  COMPARISON:  None.   FINDINGS: There is soft tissue swelling extending from the inferior base of the nose to the distal tip. No loculated fluid collections are appreciated.  The adjacent osseous structures are intact without evidence of bony destruction.  The paranasal sinuses and mastoid air cells are patent.  The ostia of the maxillary sinus on the left is stenosed. The ostia of the right maxillary sinus is unremarkable. Mucosal thickening is appreciated anteriorly within the nasal cavity on the left. The turbinates are unremarkable. The temporal mandibular joints, mandible, and orbits are unremarkable.  Prominent lymph nodes are identified within the carotid space and submental region. Largest is on the right measuring 10 mm adjacent to the salivary gland.  IMPRESSION: 1. Cellulitis involving the inferior base and distal aspect of the nose. There is no evidence of an associated abscess. 2. Areas of reactive lymph nodes. 3. Inflammatory changes within the nasal cavity on the left.   Electronically Signed   By: Salome Holmes M.D.   On: 06/02/2013 17:28     Code Status: Full Family Communication: Pt at bedside Disposition Plan: Admit for further evaluation     Review of Systems:  Constitutional:  Negative for diaphoresis.  HENT: Negative for tinnitus and ear discharge.   Eyes: Negative for blurred vision, double vision, photophobia, pain, discharge and redness.  Respiratory: Negative for cough, hemoptysis, sputum production, shortness of breath, wheezing and stridor.   Cardiovascular: Negative for chest pain, palpitations, orthopnea, claudication and leg swelling.  Gastrointestinal: Negative for nausea, vomiting and abdominal pain. Negative for heartburn, constipation, blood in stool and melena.  Genitourinary: Negative for dysuria, urgency, frequency, hematuria and flank pain.  Musculoskeletal: Negative for myalgias, back pain, joint pain and falls.  Skin: Negative for itching and rash.  Neurological:  Negative for  tingling, tremors, sensory change, speech change, focal weakness, loss of consciousness and headaches.  Endo/Heme/Allergies: Negative for environmental allergies and polydipsia. Does not bruise/bleed easily.  Psychiatric/Behavioral: Negative for suicidal ideas. The patient is not nervous/anxious.      Past Medical History  Diagnosis Date  . Hypertension   . Peripheral neuropathy   . Facial tic   . Depression     Past Surgical History  Procedure Laterality Date  . 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 Abdominal hysterectomy  2013  . Ankle fusion Right 1991  . Appendectomy  1987  . Cesarean section  1997  . Abdominoplasty  2013    Social History:  reports that she has never smoked. She does not have any smokeless tobacco history on file. She reports that she drinks about 4.8 ounces of alcohol per week. She reports that she uses illicit drugs (Oxycodone).  Allergies  Allergen Reactions  . Iodine Other (See Comments)    Blisters  . Neomycin Other (See Comments)    Blisters   . Phenergan [Promethazine Hcl] Other (See Comments)    Altered Mental Status    Family History  Problem Relation Age of Onset  . Bipolar disorder Sister   . Drug abuse Sister   . Schizophrenia Son   . Bipolar disorder Father   . Alcohol abuse Father   . Drug abuse Mother   . Alcohol abuse Mother     Prior to Admission medications   Medication Sig Start Date End Date Taking? Authorizing Provider  amoxicillin-clavulanate (AUGMENTIN) 875-125 MG per tablet Take 1 tablet by mouth every 12 (twelve) hours. 04/28/13   Ward Givens, MD  antipyrine-benzocaine Lyla Son) otic solution Place 3-4 drops into both ears every 2 (two) hours as needed for ear pain. 04/28/13   Ward Givens, MD  Armodafinil (NUVIGIL) 200 MG TABS Take 200 mg by mouth daily as needed (Only takes on 12 hour shift days).    Historical Provider, MD  Ascorbic Acid (VITAMIN C) 1000 MG tablet Take 1,000 mg by mouth daily.     Historical Provider, MD  cloNIDine (CATAPRES) 0.1 MG tablet Take 0.1 mg by mouth 3 (three) times daily.    Historical Provider, MD  DULoxetine (CYMBALTA) 30 MG capsule Take 30 mg by mouth 2 (two) times daily.    Historical Provider, MD  gabapentin (NEURONTIN) 400 MG capsule Take 400 mg by mouth 6 (six) times daily. 11/07/12   [name], PA-C  ibuprofen (ADVIL,MOTRIN) 200 MG tablet Take 600-800 mg by mouth every 6 (six) hours as needed for moderate pain.    Historical Provider, MD  IRON PO Take 1 tablet by mouth daily.    Historical Provider, MD  lisinopril (PRINIVIL,ZESTRIL) 30 MG tablet Take 30 mg by mouth 2 (two) times daily.    Historical Provider, MD  Multiple Vitamin (MULTIVITAMIN WITH MINERALS) TABS tablet Take 1 tablet by mouth daily.    Historical Provider, MD  Multiple Vitamins-Minerals (ZINC PO) Take 1 tablet by mouth daily.    Historical Provider, MD  naproxen sodium (ALEVE) 220 MG tablet Take 220-440 mg by mouth 2 (two) times daily as needed (Pain).    Historical Provider, MD  naproxen sodium (ANAPROX DS) 550 MG tablet Take 1 tablet (550 mg total) by mouth 2 (two) times daily with a meal. 04/28/13   Ward Givens, MD  ondansetron (ZOFRAN ODT) 4 MG disintegrating tablet Take 1 tablet (  4 mg total) by mouth every 8 (eight) hours as needed for nausea or vomiting. 04/28/13   Ward GivensIva L Knapp, MD  OVER THE COUNTER MEDICATION Apply 1 application topically every 2 (two) hours as needed (Flu Bomb Oil. Helps to treat Flu Symptoms.).    Historical Provider, MD  pregabalin (LYRICA) 75 MG capsule Take 150 mg by mouth 3 (three) times daily.     Historical Provider, MD  PRESCRIPTION MEDICATION Apply 1 application topically 2 (two) times daily. Compounded Cream made to help nerve pain.    Historical Provider, MD    Physical Exam: Filed Vitals:   06/02/13 1419 06/02/13 1627  BP: 159/98 120/77  Pulse: 106 84  Temp: 97.9 F (36.6 C) 98.3 F (36.8 C)  TempSrc: Oral Oral  Resp: 22 18  SpO2: 96% 98%     Physical Exam  Constitutional: Appears well-developed and well-nourished. No distress.  HENT: Normocephalic. External right and left ear normal. Oropharynx is clear and moist. Significant erythema and swelling of the nose, very tender to palpation with examination  Eyes: Conjunctivae and EOM are normal. PERRLA, no scleral icterus.  Neck: Normal ROM. Neck supple. No JVD. No tracheal deviation. No thyromegaly.  CVS: RRR, S1/S2 +, no murmurs, no gallops, no carotid bruit.  Pulmonary: Effort and breath sounds normal, no stridor, rhonchi, wheezes, rales.  Abdominal: Soft. BS +,  no distension, tenderness, rebound or guarding.  Musculoskeletal: Normal range of motion. No edema and no tenderness.  Lymphadenopathy: No lymphadenopathy noted, cervical, inguinal. Neuro: Alert. Normal reflexes, muscle tone coordination. No cranial nerve deficit. Skin: Skin is warm and dry. No rash noted. Not diaphoretic. No erythema. No pallor.  Psychiatric: Normal mood and affect. Behavior, judgment, thought content normal.   Labs on Admission:  Basic Metabolic Panel:  Recent Labs Lab 06/02/13 1528  NA 143  K 3.3*  CL 103  CO2 25  GLUCOSE 139*  BUN 11  CREATININE 0.55  CALCIUM 9.2   CBC:  Recent Labs Lab 06/02/13 1528  WBC 12.1*  NEUTROABS 9.6*  HGB 14.5  HCT 42.0  MCV 84.0  PLT 218    EKG: Normal sinus rhythm, no ST/T wave changes  Debbora PrestoMAGICK-MYERS, ISKRA, MD  [facility] Pager [phone]  If 7PM-7AM, please contact night-coverage [email or web address] Password [email or web address] 06/02/2013, 5:32 PM

## 2013-06-02 NOTE — ED Provider Notes (Signed)
CSN: [id number]     Arrival date & time 06/02/13  1404 History   None    Chief Complaint  Patient presents with  . Abscess     (Consider location/radiation/quality/duration/timing/severity/associated sxs/prior Treatment) Patient is a 43 y.o. female presenting with abscess. The history is provided by the patient.  Abscess Location:  Face Facial abscess location:  Nose Abscess quality: draining, painful, redness and warmth   Red streaking: no   Duration:  24 hours Progression:  Worsening Pain details:    Quality:  Throbbing and burning   Severity:  Severe   Timing:  Constant   Progression:  Worsening Chronicity:  New Relieved by:  Nothing Worsened by:  Draining/squeezing Ineffective treatments:  Draining/squeezing Associated symptoms: headaches   Associated symptoms: no fever, no nausea and no vomiting   Risk factors: no prior abscess    Bethany Barton is a 43 y.o. female who presents to the ED with pain, swelling and redness to her nose. She states that she had pain in her nose yesterday and had one of the doctors she works with to look at it but they did not see anything. The pain continued and about 3 am she woke with severe pain and yellow drainage from the right nostril. The pain has increased and the swelling is worse. She had bilateral otitis media last month and was treated with Augmentin. She states her ear pain did improve after that.   PMH significant for cocaine and narcotic dependence.   Past Medical History  Diagnosis Date  . Hypertension   . Peripheral neuropathy   . Facial tic   . Depression    Past Surgical History  Procedure Laterality Date  . 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 Abdominal hysterectomy  2013  . Ankle fusion Right 1991  . Appendectomy  1987  . Cesarean section  1997  . Abdominoplasty  2013   Family History  Problem Relation Age of Onset  . Bipolar disorder Sister   . Drug abuse Sister   . Schizophrenia Son   .  Bipolar disorder Father   . Alcohol abuse Father   . Drug abuse Mother   . Alcohol abuse Mother    History  Substance Use Topics  . Smoking status: Never Smoker   . Smokeless tobacco: Not on file  . Alcohol Use: 4.8 oz/week    8 Glasses of wine per week     Comment: history of cocaine and narcotic dependence   OB History   Grav Para Term Preterm Abortions TAB SAB Ect Mult Living                 Review of Systems  Constitutional: Negative for fever and chills.  HENT: Negative for congestion, ear discharge, ear pain, nosebleeds, sore throat and trouble swallowing.        Nasal swelling  Eyes: Negative for redness and visual disturbance.  Respiratory: Negative for shortness of breath.   Gastrointestinal: Negative for nausea and vomiting.  Skin: Positive for wound.  Neurological: Positive for headaches. Negative for dizziness, seizures and syncope.  Psychiatric/Behavioral: Negative for confusion.      Allergies  Iodine; Neomycin; and Phenergan  Home Medications   Current Outpatient Rx  Name  Route  Sig  Dispense  Refill  . amoxicillin-clavulanate (AUGMENTIN) 875-125 MG per tablet   Oral   Take 1 tablet by mouth every 12 (twelve) hours.   20 tablet   0   .  antipyrine-benzocaine (AURALGAN) otic solution   Both Ears   Place 3-4 drops into both ears every 2 (two) hours as needed for ear pain.   10 mL   0   . Armodafinil (NUVIGIL) 200 MG TABS   Oral   Take 200 mg by mouth daily as needed (Only takes on 12 hour shift days).         . Ascorbic Acid (VITAMIN C) 1000 MG tablet   Oral   Take 1,000 mg by mouth daily.         . cloNIDine (CATAPRES) 0.1 MG tablet   Oral   Take 0.1 mg by mouth 3 (three) times daily.         . DULoxetine (CYMBALTA) 30 MG capsule   Oral   Take 30 mg by mouth 2 (two) times daily.         Marland Kitchen gabapentin (NEURONTIN) 400 MG capsule   Oral   Take 400 mg by mouth 6 (six) times daily.         Marland Kitchen ibuprofen (ADVIL,MOTRIN) 200 MG  tablet   Oral   Take 600-800 mg by mouth every 6 (six) hours as needed for moderate pain.         . IRON PO   Oral   Take 1 tablet by mouth daily.         Marland Kitchen lisinopril (PRINIVIL,ZESTRIL) 30 MG tablet   Oral   Take 30 mg by mouth 2 (two) times daily.         . Multiple Vitamin (MULTIVITAMIN WITH MINERALS) TABS tablet   Oral   Take 1 tablet by mouth daily.         . Multiple Vitamins-Minerals (ZINC PO)   Oral   Take 1 tablet by mouth daily.         . naproxen sodium (ALEVE) 220 MG tablet   Oral   Take 220-440 mg by mouth 2 (two) times daily as needed (Pain).         . naproxen sodium (ANAPROX DS) 550 MG tablet   Oral   Take 1 tablet (550 mg total) by mouth 2 (two) times daily with a meal.   30 tablet   0   . ondansetron (ZOFRAN ODT) 4 MG disintegrating tablet   Oral   Take 1 tablet (4 mg total) by mouth every 8 (eight) hours as needed for nausea or vomiting.   6 tablet   0   . OVER THE COUNTER MEDICATION   Topical   Apply 1 application topically every 2 (two) hours as needed (Flu Bomb Oil. Helps to treat Flu Symptoms.).         Marland Kitchen pregabalin (LYRICA) 75 MG capsule   Oral   Take 150 mg by mouth 3 (three) times daily.          Marland Kitchen PRESCRIPTION MEDICATION   Topical   Apply 1 application topically 2 (two) times daily. Compounded Cream made to help nerve pain.          BP 159/98  Pulse 106  Temp(Src) 97.9 F (36.6 C) (Oral)  Resp 22  SpO2 96% Physical Exam  Nursing note and vitals reviewed. Constitutional: She is oriented to person, place, and time. She appears well-developed and well-nourished. No distress.  HENT:  Nose: Mucosal edema and sinus tenderness present.  Swelling and erythema of the nose with increased swelling and tenderness of the right nostril. There is an area of swelling with yellow drainage in the right  nostril.   Eyes: Conjunctivae and EOM are normal.  Neck: Neck supple.  Cardiovascular: Tachycardia present.   Pulmonary/Chest:  Effort normal.  Abdominal: Soft. There is no tenderness.  Musculoskeletal: Normal range of motion.  Neurological: She is alert and oriented to person, place, and time. No cranial nerve deficit.  Skin: Skin is warm and dry.  Psychiatric: She has a normal mood and affect. Her behavior is normal.   Results for orders placed during the hospital encounter of 06/02/13 (from the past 24 hour(s))  CBC WITH DIFFERENTIAL     Status: Abnormal   Collection Time    06/02/13  3:28 PM      Result Value Ref Range   WBC 12.1 (*) 4.0 - 10.5 K/uL   RBC 5.00  3.87 - 5.11 MIL/uL   Hemoglobin 14.5  12.0 - 15.0 g/dL   HCT 13.0  86.5 - 78.4 %   MCV 84.0  78.0 - 100.0 fL   MCH 29.0  26.0 - 34.0 pg   MCHC 34.5  30.0 - 36.0 g/dL   RDW 69.6  29.5 - 28.4 %   Platelets 218  150 - 400 K/uL   Neutrophils Relative % 79 (*) 43 - 77 %   Neutro Abs 9.6 (*) 1.7 - 7.7 K/uL   Lymphocytes Relative 14  12 - 46 %   Lymphs Abs 1.7  0.7 - 4.0 K/uL   Monocytes Relative 5  3 - 12 %   Monocytes Absolute 0.6  0.1 - 1.0 K/uL   Eosinophils Relative 1  0 - 5 %   Eosinophils Absolute 0.1  0.0 - 0.7 K/uL   Basophils Relative 0  0 - 1 %   Basophils Absolute 0.0  0.0 - 0.1 K/uL  BASIC METABOLIC PANEL     Status: Abnormal   Collection Time    06/02/13  3:28 PM      Result Value Ref Range   Sodium 143  137 - 147 mEq/L   Potassium 3.3 (*) 3.7 - 5.3 mEq/L   Chloride 103  96 - 112 mEq/L   CO2 25  19 - 32 mEq/L   Glucose, Bld 139 (*) 70 - 99 mg/dL   BUN 11  6 - 23 mg/dL   Creatinine, Ser 1.32  0.50 - 1.10 mg/dL   Calcium 9.2  8.4 - 44.0 mg/dL   GFR calc non Af Amer >90  >90 mL/min   GFR calc Af Amer >90  >90 mL/min    ED Course  Procedures  IV clindamycin, labs,pain management, consult hospitalitis   MDM  43 y.o. female with nasal abscess, swelling and pain.   Consult with Hospitalitis and will admit for IV antibiotics and pain management. Discussed plan of care with the patient and she voices understanding.  Temporary  admission orders written. Patient is in CT at this time.  @ 1745 Ct Maxillofacial W/cm  06/02/2013   CLINICAL DATA:  Nasal abscess, swelling and facial pain  EXAM: CT MAXILLOFACIAL WITH CONTRAST  TECHNIQUE: Multidetector CT imaging of the maxillofacial structures was performed with intravenous contrast. Multiplanar CT image reconstructions were also generated. A small metallic BB was placed on the right temple in order to reliably differentiate right from left.  CONTRAST:  75mL OMNIPAQUE IOHEXOL 300 MG/ML  SOLN  COMPARISON:  None.  FINDINGS: There is soft tissue swelling extending from the inferior base of the nose to the distal tip. No loculated fluid collections are appreciated.  The adjacent osseous structures are  intact without evidence of bony destruction.  The paranasal sinuses and mastoid air cells are patent.  The ostia of the maxillary sinus on the left is stenosed. The ostia of the right maxillary sinus is unremarkable. Mucosal thickening is appreciated anteriorly within the nasal cavity on the left. The turbinates are unremarkable. The temporal mandibular joints, mandible, and orbits are unremarkable.  Prominent lymph nodes are identified within the carotid space and submental region. Largest is on the right measuring 10 mm adjacent to the salivary gland.  IMPRESSION: 1. Cellulitis involving the inferior base and distal aspect of the nose. There is no evidence of an associated abscess. 2. Areas of reactive lymph nodes. 3. Inflammatory changes within the nasal cavity on the left.   Electronically Signed   By: Salome HolmesHector  Cooper M.D.   On: 06/02/2013 17:28       Hope Orlene OchM Neese, NP 06/02/13 1745

## 2013-06-03 DIAGNOSIS — L0291 Cutaneous abscess, unspecified: Secondary | ICD-10-CM

## 2013-06-03 DIAGNOSIS — L0390 Cellulitis, unspecified: Secondary | ICD-10-CM

## 2013-06-03 LAB — CBC
HCT: 38.5 % (ref 36.0–46.0)
HEMOGLOBIN: 12.8 g/dL (ref 12.0–15.0)
MCH: 28.8 pg (ref 26.0–34.0)
MCHC: 33.2 g/dL (ref 30.0–36.0)
MCV: 86.5 fL (ref 78.0–100.0)
Platelets: 225 10*3/uL (ref 150–400)
RBC: 4.45 MIL/uL (ref 3.87–5.11)
RDW: 13.2 % (ref 11.5–15.5)
WBC: 12.9 10*3/uL — AB (ref 4.0–10.5)

## 2013-06-03 LAB — BASIC METABOLIC PANEL
BUN: 15 mg/dL (ref 6–23)
CHLORIDE: 103 meq/L (ref 96–112)
CO2: 27 mEq/L (ref 19–32)
CREATININE: 0.81 mg/dL (ref 0.50–1.10)
Calcium: 9 mg/dL (ref 8.4–10.5)
GFR calc Af Amer: >90 mL/min (ref >90)
GFR calc non Af Amer: 90 mL/min — ABNORMAL LOW (ref >90)
Glucose, Bld: 133 mg/dL — ABNORMAL HIGH (ref 70–99)
Potassium: 4.2 mEq/L (ref 3.7–5.3)
SODIUM: 140 meq/L (ref 137–147)

## 2013-06-03 MED ORDER — VANCOMYCIN HCL IN DEXTROSE 1-5 GM/200ML-% IV SOLN
INTRAVENOUS | Status: AC
  Filled 2013-06-03: qty 200

## 2013-06-03 MED ORDER — OXYCODONE-ACETAMINOPHEN 5-325 MG PO TABS
1.0000 | ORAL_TABLET | ORAL | Status: DC | PRN
  Administered 2013-06-04: 1 via ORAL
  Filled 2013-06-03: qty 1

## 2013-06-03 NOTE — Progress Notes (Signed)
Patient ID: Bethany Barton, female   DOB: 09-23-1970, 43 y.o.   MRN: [id number]  [facility] PROGRESS NOTE  Bethany Barton:[id number] DOB: 03/30/1970 DOA: 06/26/2013 PCP: No PCP Per Patient  Brief narrative: Patient is 43 year old female who presented to emergency department with several days duration of progressively worsening nasal pain, throbbing and constant, 5/10 in severity, no specific aggravating or alleviating factors, associated with erythema and swelling, purulent drainage, sensation as if it's draining inside. Patient also reports swelling of the lymph nodes in the neck. She reports subjective fevers and chills, malaise, poor oral intake. She explains recent otitis media, last month that was treated with Augmentin. She reports her symptoms initially improved but have now gotten worse.   In emergency department, patient is hemodynamically stable, [facility] asked to admit for further management with IV antibiotics.   Assessment and Plan:  Active Problems:  Cellulitis of the nose  - Maxillofacial CT consistent with cellulitis but no associated abscess  - continue vancomycin and Zosyn day #2 - continue IV fluids, analgesia as needed  - Patient can eat regular diet if tolerating  - Provide antiemetics if needed  Leukocytosis  - Secondary to cellulitis  - Antibiotics as noted above  Hypokalemia  - Supplemented and WNL this AM  Consultants:  None Procedures/Studies:  Ct Maxillofacial W/cm  06-26-13   Cellulitis involving the inferior base and distal aspect of the nose,  no evidence of an associated abscess, Areas of reactive lymph nodes. Inflammatory changes within the nasal cavity on the left.    Antibiotics:  Vancomycin 4/4 -->  Zosyn 4/4 -->  Code Status: Full Family Communication: Pt at bedside Disposition Plan: Home when medically stable  HPI/Subjective: No events overnight.   Objective: Filed Vitals:   Jun 26, 2013 1810 26-Jun-2013 1834 06/26/2013 2007 06/03/13 0431  BP:  154/112 176/96 136/85 108/65  Pulse: 78  88 72  Temp: 98.7 F (37.1 C)  98.3 F (36.8 C) 98.1 F (36.7 C)  TempSrc: Oral  Oral Oral  Resp: 18  18 18   Height: 5\' 3"  (1.6 m)     Weight: 82.9 kg (182 lb 12.2 oz)     SpO2: 100%  100% 95%    Intake/Output Summary (Last 24 hours) at 06/03/13 1006 Last data filed at 06/03/13 0511  Gross per 24 hour  Intake 1543.75 ml  Output    250 ml  Net 1293.75 ml    Exam:   General:  Pt is alert, follows commands appropriately, not in acute distress, nasal area still swollen with erythema and TTP  Cardiovascular: Regular rate and rhythm, S1/S2, no murmurs, no rubs, no gallops  Respiratory: Clear to auscultation bilaterally, no wheezing, no crackles, no rhonchi  Abdomen: Soft, non tender, non distended, bowel sounds present, no guarding  Data Reviewed: Basic Metabolic Panel:  Recent Labs Lab 06/26/2013 1528 06/03/13 0534  NA 143 140  K 3.3* 4.2  CL 103 103  CO2 25 27  GLUCOSE 139* 133*  BUN 11 15  CREATININE 0.55 0.81  CALCIUM 9.2 9.0   CBC:  Recent Labs Lab 06/26/13 1528 06/03/13 0534  WBC 12.1* 12.9*  NEUTROABS 9.6*  --   HGB 14.5 12.8  HCT 42.0 38.5  MCV 84.0 86.5  PLT 218 225    Scheduled Meds: . cloNIDine  0.1 mg Oral TID  . DULoxetine  30 mg Oral BID  . enoxaparin (LOVENOX) injection  40 mg Subcutaneous Q24H  . gabapentin  400 mg Oral 6 X Daily  .  lisinopril  30 mg Oral BID  . piperacillin-tazobactam (ZOSYN)  IV  3.375 g Intravenous Q8H  . pregabalin  150 mg Oral TID  . vancomycin  1,000 mg Intravenous Q8H   Continuous Infusions: . sodium chloride 75 mL/hr at 06/02/13 1821     [name], MD  [facility] Pager [phone]  If 7PM-7AM, please contact night-coverage [email or web address] Password [email or web address] 06/03/2013, 10:06 AM   LOS: 1 day

## 2013-06-03 NOTE — ED Provider Notes (Signed)
Medical screening examination/treatment/procedure(s) were performed by non-physician practitioner and as supervising physician I was immediately available for consultation/collaboration.   EKG Interpretation None       Juliet RudeNathan R. Rubin PayorPickering, MD 06/03/13 [phone]

## 2013-06-04 LAB — BASIC METABOLIC PANEL
BUN: 17 mg/dL (ref 6–23)
CALCIUM: 8.8 mg/dL (ref 8.4–10.5)
CO2: 28 mEq/L (ref 19–32)
CREATININE: 0.79 mg/dL (ref 0.50–1.10)
Chloride: 100 mEq/L (ref 96–112)
GFR calc Af Amer: >90 mL/min (ref >90)
GLUCOSE: 92 mg/dL (ref 70–99)
POTASSIUM: 3.7 meq/L (ref 3.7–5.3)
Sodium: 139 mEq/L (ref 137–147)

## 2013-06-04 LAB — CBC
HEMATOCRIT: 35.6 % — AB (ref 36.0–46.0)
HEMOGLOBIN: 11.8 g/dL — AB (ref 12.0–15.0)
MCH: 28.9 pg (ref 26.0–34.0)
MCHC: 33.1 g/dL (ref 30.0–36.0)
MCV: 87 fL (ref 78.0–100.0)
Platelets: 205 10*3/uL (ref 150–400)
RBC: 4.09 MIL/uL (ref 3.87–5.11)
RDW: 12.9 % (ref 11.5–15.5)
WBC: 7.2 10*3/uL (ref 4.0–10.5)

## 2013-06-04 LAB — VANCOMYCIN, TROUGH: Vancomycin Tr: 16.7 ug/mL (ref 10.0–20.0)

## 2013-06-04 LAB — CLOSTRIDIUM DIFFICILE BY PCR: Toxigenic C. Difficile by PCR: NEGATIVE

## 2013-06-04 MED ORDER — VANCOMYCIN HCL 10 G IV SOLR
1250.0000 mg | Freq: Two times a day (BID) | INTRAVENOUS | Status: DC
  Filled 2013-06-04 (×2): qty 1250

## 2013-06-04 MED ORDER — LOPERAMIDE HCL 2 MG PO CAPS
2.0000 mg | ORAL_CAPSULE | Freq: Once | ORAL | Status: AC
  Administered 2013-06-04: 2 mg via ORAL
  Filled 2013-06-04: qty 1

## 2013-06-04 MED ORDER — ONDANSETRON HCL 4 MG PO TABS: 4.0000 mg | ORAL_TABLET | Freq: Four times a day (QID) | ORAL | Status: DC | PRN

## 2013-06-04 MED ORDER — SULFAMETHOXAZOLE-TMP DS 800-160 MG PO TABS: 1.0000 | ORAL_TABLET | Freq: Two times a day (BID) | ORAL | Status: DC

## 2013-06-04 MED ORDER — OXYCODONE-ACETAMINOPHEN 5-325 MG PO TABS: 1.0000 | ORAL_TABLET | ORAL | Status: DC | PRN

## 2013-06-04 NOTE — Care Management Note (Signed)
    Page 1 of 1   06/04/2013     2:15:44 PM   CARE MANAGEMENT NOTE 06/04/2013  Patient:  Bethany Barton,Bethany Barton   Account Number:  [id number]  Date Initiated:  06/04/2013  Documentation initiated by:  Rosemary HolmsOBSON,Junior Kenedy  Subjective/Objective Assessment:   Pt admitted with cellulitis of the face. No PCP per pt.     Action/Plan:   Anticipated DC Date:  06/05/2013   Anticipated DC Plan:  HOME/SELF CARE      DC Planning Services  CM consult      Choice offered to / List presented to:             Status of service:  Completed, signed off Medicare Important Message given?   (If response is "NO", the following Medicare IM given date fields will be blank) Date Medicare IM given:   Date Additional Medicare IM given:    Discharge Disposition:  HOME/SELF CARE  Per UR Regulation:    If discussed at Long Length of Stay Meetings, dates discussed:    Comments:  06/04/13 Rosemary HolmsAmy Otie Headlee RN BSN CM New pt appt made with Dr. Karilyn CotaGosrani for 06/21/13 at 11:40

## 2013-06-04 NOTE — Care Management Utilization Note (Signed)
UR completed 

## 2013-06-04 NOTE — Progress Notes (Signed)
Patient states understanding of discharge instructions, prescriptions given 

## 2013-06-04 NOTE — Progress Notes (Signed)
Patient ID: Bethany Barton, female   DOB: June 12, 1970, 43 y.o.   MRN: [id number]  [facility] PROGRESS NOTE  Shirlie Enck NGE:[id number] DOB: 1971-01-13 DOA: 06/02/2013 PCP: No PCP Per Patient  Brief narrative:  Patient is 43 year old female who presented to emergency department with several days duration of progressively worsening nasal pain, throbbing and constant, 5/10 in severity, no specific aggravating or alleviating factors, associated with erythema and swelling, purulent drainage, sensation as if it's draining inside. Patient also reports swelling of the lymph nodes in the neck. She reports subjective fevers and chills, malaise, poor oral intake. She explains recent otitis media, last month that was treated with Augmentin. She reports her symptoms initially improved but have now gotten worse.   In emergency department, patient is hemodynamically stable, [facility] asked to admit for further management with IV antibiotics.   Assessment and Plan:  Active Problems:  Cellulitis of the nose  - Maxillofacial CT consistent with cellulitis but no associated abscess  - continue vancomycin and Zosyn day #3 - pt responding well and feels better this AM - Patient can eat regular diet if tolerating  - Provide antiemetics if needed  Leukocytosis  - Secondary to cellulitis  - Antibiotics as noted above  - WBC is WNL this AM Diarrhea - C. Diff pending  Hypokalemia  - Supplemented and WNL this AM   Consultants:  None Procedures/Studies:  Ct Maxillofacial W/cm 06/02/2013 Cellulitis involving the inferior base and distal aspect of the nose, no evidence of an associated abscess, Areas of reactive lymph nodes. Inflammatory changes within the nasal cavity on the left.  Antibiotics:  Vancomycin 4/4 -->  Zosyn 4/4 -->  Code Status: Full  Family Communication: Pt at bedside  Disposition Plan: Home when medically stable  HPI/Subjective: No events overnight.   Objective: Filed Vitals:   06/03/13 1306  06/03/13 2211 06/04/13 0602 06/04/13 0637  BP: 94/56 120/86 112/74   Pulse: 74 73 72   Temp: 97.3 F (36.3 C) 98 F (36.7 C) 98.1 F (36.7 C)   TempSrc: Oral Oral Oral   Resp: 18 18 18    Height:      Weight:      SpO2: 97% 91% 89% 95%    Intake/Output Summary (Last 24 hours) at 06/04/13 0944 Last data filed at 06/04/13 0648  Gross per 24 hour  Intake 2424.58 ml  Output      0 ml  Net 2424.58 ml    Exam:   General:  Pt is alert, follows commands appropriately, not in acute distress, nasal area with less erythema and less TTP  Cardiovascular: Regular rate and rhythm, S1/S2, no murmurs, no rubs, no gallops  Respiratory: Clear to auscultation bilaterally, no wheezing, no crackles, no rhonchi  Abdomen: Soft, non tender, non distended, bowel sounds present, no guarding  Extremities: No edema, pulses DP and PT palpable bilaterally  Neuro: Grossly nonfocal  Data Reviewed: Basic Metabolic Panel:  Recent Labs Lab 06/02/13 1528 06/03/13 0534 06/04/13 0552  NA 143 140 139  K 3.3* 4.2 3.7  CL 103 103 100  CO2 25 27 28   GLUCOSE 139* 133* 92  BUN 11 15 17   CREATININE 0.55 0.81 0.79  CALCIUM 9.2 9.0 8.8   CBC:  Recent Labs Lab 06/02/13 1528 06/03/13 0534 06/04/13 0552  WBC 12.1* 12.9* 7.2  NEUTROABS 9.6*  --   --   HGB 14.5 12.8 11.8*  HCT 42.0 38.5 35.6*  MCV 84.0 86.5 87.0  PLT 218 225 205  Scheduled Meds: . cloNIDine  0.1 mg Oral TID  . DULoxetine  30 mg Oral BID  . enoxaparin (LOVENOX) injection  40 mg Subcutaneous Q24H  . gabapentin  400 mg Oral 6 X Daily  . lisinopril  30 mg Oral BID  . piperacillin-tazobactam (ZOSYN)  IV  3.375 g Intravenous Q8H  . pregabalin  150 mg Oral TID  . vancomycin  1,250 mg Intravenous Q12H   Continuous Infusions: . sodium chloride 50 mL/hr at 06/03/13 1208     Debbora PrestoMAGICK-Maveryck Bahri, MD  [facility] Pager [phone]  If 7PM-7AM, please contact night-coverage [email or web address] Password [email or web address] 06/04/2013, 9:44 AM   LOS: 2  days

## 2013-06-04 NOTE — Discharge Instructions (Signed)
Cellulitis Cellulitis is an infection of the skin and the tissue beneath it. The infected area is usually red and tender. Cellulitis occurs most often in the arms and lower legs.  CAUSES  Cellulitis is caused by bacteria that enter the skin through cracks or cuts in the skin. The most common types of bacteria that cause cellulitis are Staphylococcus and Streptococcus. SYMPTOMS   Redness and warmth.  Swelling.  Tenderness or pain.  Fever. DIAGNOSIS  Your caregiver can usually determine what is wrong based on a physical exam. Blood tests may also be done. TREATMENT  Treatment usually involves taking an antibiotic medicine. HOME CARE INSTRUCTIONS   Take your antibiotics as directed. Finish them even if you start to feel better.  Keep the infected arm or leg elevated to reduce swelling.  Apply a warm cloth to the affected area up to 4 times per day to relieve pain.  Only take over-the-counter or prescription medicines for pain, discomfort, or fever as directed by your caregiver.  Keep all follow-up appointments as directed by your caregiver. SEEK MEDICAL CARE IF:   You notice red streaks coming from the infected area.  Your red area gets larger or turns dark in color.  Your bone or joint underneath the infected area becomes painful after the skin has healed.  Your infection returns in the same area or another area.  You notice a swollen bump in the infected area.  You develop new symptoms. SEEK IMMEDIATE MEDICAL CARE IF:   You have a fever.  You feel very sleepy.  You develop vomiting or diarrhea.  You have a general ill feeling (malaise) with muscle aches and pains. MAKE SURE YOU:   Understand these instructions.  Will watch your condition.  Will get help right away if you are not doing well or get worse. Document Released: 11/25/2004 Document Revised: 08/17/2011 Document Reviewed: 05/03/2011 [employer] Patient Information 2014 [employer].  

## 2013-06-04 NOTE — Progress Notes (Signed)
Notified by patient that she had had another episode of an incontinent loose bowel movement. Patient stated that it had occurred once during day shift and twice during night shift. Patient requested some imodium. MD was made aware. MD ordered a one time does of 2mg  imodium PO and Cdiff PCR. Patient received ordered dose of imodium. Per protocol patient was placed on enteric precautions. Patient is resting at this time. Will continue to monitor patient.

## 2013-06-04 NOTE — Progress Notes (Signed)
ANTIBIOTIC CONSULT NOTE  Pharmacy Consult for Vancomycin & Zosyn Indication: cellulitis  Allergies  Allergen Reactions  . Iodine Other (See Comments)    Blisters  . Neomycin Other (See Comments)    Blisters   . Phenergan [Promethazine Hcl] Other (See Comments)    Altered Mental Status    Patient Measurements: Height: 5\' 3"  (160 cm) Weight: 182 lb 12.2 oz (82.9 kg) IBW/kg (Calculated) : 52.4  Vital Signs: Temp: 98.1 F (36.7 C) (04/06 0602) Temp src: Oral (04/06 0602) BP: 112/74 mmHg (04/06 0602) Pulse Rate: 72 (04/06 0602) Intake/Output from previous day: 04/05 0701 - 04/06 0700 In: 2424.6 [P.O.:720; I.V.:1204.6; IV Piggyback:500] Out: -  Intake/Output from this shift:    Labs:  Recent Labs  06/02/13 1528 06/03/13 0534 06/04/13 0552  WBC 12.1* 12.9* 7.2  HGB 14.5 12.8 11.8*  PLT 218 225 205  CREATININE 0.55 0.81 0.79   Estimated Creatinine Clearance: 95.3 ml/min (by C-G formula based on Cr of 0.79).  Recent Labs  06/04/13 0552  VANCOTROUGH 16.7     Microbiology: No results found for this or any previous visit (from the past 720 hour(s)).  Medical History: Past Medical History  Diagnosis Date  . Hypertension   . Peripheral neuropathy   . Facial tic   . Depression   . Diabetes mellitus without complication     Medications:  Scheduled:  . cloNIDine  0.1 mg Oral TID  . DULoxetine  30 mg Oral BID  . enoxaparin (LOVENOX) injection  40 mg Subcutaneous Q24H  . gabapentin  400 mg Oral 6 X Daily  . lisinopril  30 mg Oral BID  . piperacillin-tazobactam (ZOSYN)  IV  3.375 g Intravenous Q8H  . pregabalin  150 mg Oral TID  . vancomycin  1,000 mg Intravenous Q8H   Assessment: 43 yo F with worsening nasal cellulitis associated with pain, redness, swelling, and purulent drainage.  WBC has normalized.  No abscess noted on CT. Renal function remains stable at patient's baseline.  Vancomycin trough is slightly above goal range.   Vancomycin 4/4>> Zosyn  4/4>>  Goal of Therapy:  Vancomycin trough level 10-15 mcg/ml  Plan:  Zosyn 3.375gm IV Q8h to be infused over 4hrs Decrease Vancomycin 1250mg  IV q12h Weekly Vancomycin trough  Monitor renal function and cx data   Elson ClanLilliston, Shaniya Tashiro Michelle 06/04/2013,7:53 AM

## 2013-06-04 NOTE — Discharge Summary (Signed)
Physician Discharge Summary  Bethany RiasBrandy Barton JXB:[id number] DOB: 02/24/1971 DOA: 06/02/2013  PCP: No PCP Per Patient  Admit date: 06/02/2013 Discharge date: 06/04/2013  Recommendations for Outpatient Follow-up:  1. Pt will need to follow up with PCP in 2-3 weeks post discharge 2. Please obtain BMP to evaluate electrolytes and kidney function 3. Please also check CBC to evaluate Hg and Hct levels 4. Continue Bactrim upon discharge   Discharge Diagnoses:  Active Problems:   Abscess of nose   Cellulitis  Discharge Condition: Stable  Diet recommendation: Heart healthy diet discussed in details   Brief narrative:  Patient is 43 year old female who presented to emergency department with several days duration of progressively worsening nasal pain, throbbing and constant, 5/10 in severity, no specific aggravating or alleviating factors, associated with erythema and swelling, purulent drainage, sensation as if it's draining inside. Patient also reports swelling of the lymph nodes in the neck. She reports subjective fevers and chills, malaise, poor oral intake. She explains recent otitis media, last month that was treated with Augmentin. She reports her symptoms initially improved but have now gotten worse.   In emergency department, patient is hemodynamically stable, [facility] asked to admit for further management with IV antibiotics.   Assessment and Plan:  Active Problems:  Cellulitis of the nose  - Maxillofacial CT consistent with cellulitis but no associated abscess  - continue vancomycin and Zosyn day #3 and transition to oral Bactrim upon discharge  - pt responding well and feels better this AM  - Patient can eat regular diet and tolerating well, wants to go home  Leukocytosis  - Secondary to cellulitis  - Antibiotics as noted above  - WBC is WNL this AM  Diarrhea  - C. Diff negative  Hypokalemia  - Supplemented and WNL this AM   Consultants:  None Procedures/Studies:  Ct Maxillofacial  W/cm 06/02/2013 Cellulitis involving the inferior base and distal aspect of the nose, no evidence of an associated abscess, Areas of reactive lymph nodes. Inflammatory changes within the nasal cavity on the left.  Antibiotics:  Vancomycin 4/4 --> 4/6 Zosyn 4/4 --> 4/6 Bactrim upon discharge   Code Status: Full  Family Communication: Pt at bedside   Discharge Exam: Filed Vitals:   06/04/13 0602  BP: 112/74  Pulse: 72  Temp: 98.1 F (36.7 C)  Resp: 18   Filed Vitals:   06/03/13 1306 06/03/13 2211 06/04/13 0602 06/04/13 0637  BP: 94/56 120/86 112/74   Pulse: 74 73 72   Temp: 97.3 F (36.3 C) 98 F (36.7 C) 98.1 F (36.7 C)   TempSrc: Oral Oral Oral   Resp: 18 18 18    Height:      Weight:      SpO2: 97% 91% 89% 95%    General: Pt is alert, follows commands appropriately, not in acute distress, nasal area cellulitis improving with less edema and erythema  Cardiovascular: Regular rate and rhythm, S1/S2 +, no murmurs, no rubs, no gallops Respiratory: Clear to auscultation bilaterally, no wheezing, no crackles, no rhonchi Abdominal: Soft, non tender, non distended, bowel sounds +, no guarding Extremities: no edema, no cyanosis, pulses palpable bilaterally DP and PT Neuro: Grossly nonfocal  Discharge Instructions  Discharge Orders   Future Orders Complete By Expires   Diet - low sodium heart healthy  As directed    Increase activity slowly  As directed        Medication List         naproxen sodium 550 MG tablet  Commonly known as:  ANAPROX DS  Take 1 tablet (550 mg total) by mouth 2 (two) times daily with a meal.     ALEVE 220 MG tablet  Generic drug:  naproxen sodium  Take 220-440 mg by mouth 2 (two) times daily as needed (Pain).     cloNIDine 0.1 MG tablet  Commonly known as:  CATAPRES  Take 0.1 mg by mouth 3 (three) times daily.     DULoxetine 30 MG capsule  Commonly known as:  CYMBALTA  Take 30 mg by mouth 2 (two) times daily.     gabapentin 400 MG  capsule  Commonly known as:  NEURONTIN  Take 400 mg by mouth 6 (six) times daily.     ibuprofen 200 MG tablet  Commonly known as:  ADVIL,MOTRIN  Take 600-800 mg by mouth every 6 (six) hours as needed for moderate pain.     IRON PO  Take 1 tablet by mouth daily.     lisinopril 30 MG tablet  Commonly known as:  PRINIVIL,ZESTRIL  Take 30 mg by mouth 2 (two) times daily.     NUVIGIL 200 MG Tabs  Generic drug:  Armodafinil  Take 200 mg by mouth daily as needed (for "shift work" Only takes on 12 hour shift days.).     ondansetron 4 MG tablet  Commonly known as:  ZOFRAN  Take 1 tablet (4 mg total) by mouth every 6 (six) hours as needed for nausea.     oxyCODONE-acetaminophen 5-325 MG per tablet  Commonly known as:  PERCOCET/ROXICET  Take 1 tablet by mouth every 4 (four) hours as needed for severe pain.     pregabalin 75 MG capsule  Commonly known as:  LYRICA  Take 150 mg by mouth 3 (three) times daily.     PRESCRIPTION MEDICATION  Apply 1 application topically 2 (two) times daily. Compounded Cream made to help nerve pain.     sulfamethoxazole-trimethoprim 800-160 MG per tablet  Commonly known as:  BACTRIM DS  Take 1 tablet by mouth 2 (two) times daily.     vitamin C 1000 MG tablet  Take 1,000 mg by mouth daily.     ZINC PO  Take 1 tablet by mouth daily.           Follow-up Information   Schedule an appointment as soon as possible for a visit with Debbora Presto, MD.   Specialty:  Internal Medicine   Contact information:   [address] [place] [address] [phone]        The results of significant diagnostics from this hospitalization (including imaging, microbiology, ancillary and laboratory) are listed below for reference.     Microbiology: Recent Results (from the past 240 hour(s))  CLOSTRIDIUM DIFFICILE BY PCR     Status: None   Collection Time    06/04/13  4:26 AM      Result Value Ref Range Status   C difficile by pcr NEGATIVE   NEGATIVE Final     Labs: Basic Metabolic Panel:  Recent Labs Lab 06/02/13 1528 06/03/13 0534 06/04/13 0552  NA 143 140 139  K 3.3* 4.2 3.7  CL 103 103 100  CO2 25 27 28   GLUCOSE 139* 133* 92  BUN 11 15 17   CREATININE 0.55 0.81 0.79  CALCIUM 9.2 9.0 8.8   Liver Function Tests: No results found for this basename: AST, ALT, ALKPHOS, BILITOT, PROT, ALBUMIN,  in the last 168 hours No results found for this basename: LIPASE, AMYLASE,  in the last 168 hours No results found for this basename: AMMONIA,  in the last 168 hours CBC:  Recent Labs Lab 06/02/13 1528 06/03/13 0534 06/04/13 0552  WBC 12.1* 12.9* 7.2  NEUTROABS 9.6*  --   --   HGB 14.5 12.8 11.8*  HCT 42.0 38.5 35.6*  MCV 84.0 86.5 87.0  PLT 218 225 205   Cardiac Enzymes: No results found for this basename: CKTOTAL, CKMB, CKMBINDEX, TROPONINI,  in the last 168 hours BNP: BNP (last 3 results) No results found for this basename: PROBNP,  in the last 8760 hours CBG: No results found for this basename: GLUCAP,  in the last 168 hours   SIGNED: Time coordinating discharge: Over 30 minutes  Debbora Presto, MD  [facility] 06/04/2013, 11:17 AM Pager [phone]  If 7PM-7AM, please contact night-coverage [email or web address] Password [email or web address]

## 2013-07-24 ENCOUNTER — Encounter (HOSPITAL_COMMUNITY): Payer: Self-pay | Admitting: Emergency Medicine

## 2013-07-24 ENCOUNTER — Emergency Department (HOSPITAL_COMMUNITY)
Admission: EM | Admit: 2013-07-24 | Discharge: 2013-07-24 | Disposition: A | Discharge status: Home / Self Care | Payer: 59 | Attending: Emergency Medicine | Admitting: Emergency Medicine

## 2013-07-24 ENCOUNTER — Observation Stay (HOSPITAL_COMMUNITY)
Admission: EM | Admit: 2013-07-24 | Discharge: 2013-07-25 | Disposition: A | Discharge status: Home / Self Care | Payer: 59 | Source: Intra-hospital | Attending: Psychiatry | Admitting: Psychiatry

## 2013-07-24 DIAGNOSIS — F1014 Alcohol abuse with alcohol-induced mood disorder: Secondary | ICD-10-CM | POA: Diagnosis present

## 2013-07-24 DIAGNOSIS — F3289 Other specified depressive episodes: Secondary | ICD-10-CM | POA: Insufficient documentation

## 2013-07-24 DIAGNOSIS — F39 Unspecified mood [affective] disorder: Secondary | ICD-10-CM | POA: Insufficient documentation

## 2013-07-24 DIAGNOSIS — Z9181 History of falling: Secondary | ICD-10-CM | POA: Insufficient documentation

## 2013-07-24 DIAGNOSIS — G8929 Other chronic pain: Secondary | ICD-10-CM | POA: Insufficient documentation

## 2013-07-24 DIAGNOSIS — F329 Major depressive disorder, single episode, unspecified: Secondary | ICD-10-CM | POA: Insufficient documentation

## 2013-07-24 DIAGNOSIS — I10 Essential (primary) hypertension: Secondary | ICD-10-CM | POA: Insufficient documentation

## 2013-07-24 DIAGNOSIS — Z79899 Other long term (current) drug therapy: Secondary | ICD-10-CM | POA: Insufficient documentation

## 2013-07-24 DIAGNOSIS — F1910 Other psychoactive substance abuse, uncomplicated: Secondary | ICD-10-CM

## 2013-07-24 DIAGNOSIS — F411 Generalized anxiety disorder: Secondary | ICD-10-CM | POA: Insufficient documentation

## 2013-07-24 DIAGNOSIS — E119 Type 2 diabetes mellitus without complications: Secondary | ICD-10-CM | POA: Insufficient documentation

## 2013-07-24 DIAGNOSIS — F1410 Cocaine abuse, uncomplicated: Secondary | ICD-10-CM | POA: Insufficient documentation

## 2013-07-24 DIAGNOSIS — Z791 Long term (current) use of non-steroidal anti-inflammatories (NSAID): Secondary | ICD-10-CM | POA: Insufficient documentation

## 2013-07-24 DIAGNOSIS — R41840 Attention and concentration deficit: Secondary | ICD-10-CM | POA: Insufficient documentation

## 2013-07-24 DIAGNOSIS — F1010 Alcohol abuse, uncomplicated: Principal | ICD-10-CM | POA: Insufficient documentation

## 2013-07-24 DIAGNOSIS — F603 Borderline personality disorder: Secondary | ICD-10-CM | POA: Insufficient documentation

## 2013-07-24 DIAGNOSIS — G479 Sleep disorder, unspecified: Secondary | ICD-10-CM | POA: Insufficient documentation

## 2013-07-24 DIAGNOSIS — G609 Hereditary and idiopathic neuropathy, unspecified: Secondary | ICD-10-CM | POA: Insufficient documentation

## 2013-07-24 DIAGNOSIS — F1310 Sedative, hypnotic or anxiolytic abuse, uncomplicated: Secondary | ICD-10-CM | POA: Insufficient documentation

## 2013-07-24 LAB — COMPREHENSIVE METABOLIC PANEL
ALK PHOS: 124 U/L — AB (ref 39–117)
ALT: 23 U/L (ref 0–35)
AST: 24 U/L (ref 0–37)
Albumin: 3.6 g/dL (ref 3.5–5.2)
BUN: 7 mg/dL (ref 6–23)
CO2: 25 mEq/L (ref 19–32)
Calcium: 8.7 mg/dL (ref 8.4–10.5)
Chloride: 108 mEq/L (ref 96–112)
Creatinine, Ser: 0.76 mg/dL (ref 0.50–1.10)
GFR calc Af Amer: >90 mL/min (ref >90)
GFR calc non Af Amer: >90 mL/min (ref >90)
Glucose, Bld: 95 mg/dL (ref 70–99)
POTASSIUM: 3.9 meq/L (ref 3.7–5.3)
SODIUM: 149 meq/L — AB (ref 137–147)
Total Bilirubin: <0.2 mg/dL — ABNORMAL LOW (ref 0.3–1.2)
Total Protein: 7.2 g/dL (ref 6.0–8.3)

## 2013-07-24 LAB — URINALYSIS, ROUTINE W REFLEX MICROSCOPIC
Bilirubin Urine: NEGATIVE
GLUCOSE, UA: NEGATIVE mg/dL
KETONES UR: NEGATIVE mg/dL
Nitrite: NEGATIVE
Protein, ur: NEGATIVE mg/dL
Specific Gravity, Urine: 1.025 (ref 1.005–1.030)
Urobilinogen, UA: 0.2 mg/dL (ref 0.0–1.0)
pH: 6 (ref 5.0–8.0)

## 2013-07-24 LAB — RAPID URINE DRUG SCREEN, HOSP PERFORMED
Amphetamines: NOT DETECTED
BARBITURATES: NOT DETECTED
Benzodiazepines: POSITIVE — AB
COCAINE: POSITIVE — AB
OPIATES: NOT DETECTED
Tetrahydrocannabinol: NOT DETECTED

## 2013-07-24 LAB — CBC WITH DIFFERENTIAL/PLATELET
Basophils Absolute: 0 10*3/uL (ref 0.0–0.1)
Basophils Relative: 0 % (ref 0–1)
EOS PCT: 1 % (ref 0–5)
Eosinophils Absolute: 0.1 10*3/uL (ref 0.0–0.7)
HEMATOCRIT: 42.9 % (ref 36.0–46.0)
Hemoglobin: 14.4 g/dL (ref 12.0–15.0)
LYMPHS PCT: 25 % (ref 12–46)
Lymphs Abs: 2.1 10*3/uL (ref 0.7–4.0)
MCH: 28.8 pg (ref 26.0–34.0)
MCHC: 33.6 g/dL (ref 30.0–36.0)
MCV: 85.8 fL (ref 78.0–100.0)
MONO ABS: 0.4 10*3/uL (ref 0.1–1.0)
Monocytes Relative: 5 % (ref 3–12)
Neutro Abs: 5.7 10*3/uL (ref 1.7–7.7)
Neutrophils Relative %: 69 % (ref 43–77)
Platelets: 260 10*3/uL (ref 150–400)
RBC: 5 MIL/uL (ref 3.87–5.11)
RDW: 13.1 % (ref 11.5–15.5)
WBC: 8.4 10*3/uL (ref 4.0–10.5)

## 2013-07-24 LAB — ETHANOL: Alcohol, Ethyl (B): 227 mg/dL — ABNORMAL HIGH (ref 0–11)

## 2013-07-24 LAB — URINE MICROSCOPIC-ADD ON

## 2013-07-24 MED ORDER — OXYCODONE-ACETAMINOPHEN 5-325 MG PO TABS
1.0000 | ORAL_TABLET | Freq: Once | ORAL | Status: AC
  Administered 2013-07-24: 1 via ORAL
  Filled 2013-07-24: qty 1

## 2013-07-24 MED ORDER — CHLORDIAZEPOXIDE HCL 25 MG PO CAPS: 25.0000 mg | ORAL_CAPSULE | Freq: Every day | ORAL | Status: DC

## 2013-07-24 MED ORDER — FERROUS SULFATE 325 (65 FE) MG PO TABS
325.0000 mg | ORAL_TABLET | Freq: Every day | ORAL | Status: DC
  Administered 2013-07-25: 325 mg via ORAL
  Filled 2013-07-24 (×2): qty 1

## 2013-07-24 MED ORDER — VITAMIN B-1 100 MG PO TABS
100.0000 mg | ORAL_TABLET | Freq: Every day | ORAL | Status: DC
  Administered 2013-07-25: 100 mg via ORAL
  Filled 2013-07-24 (×2): qty 1

## 2013-07-24 MED ORDER — HYDROXYZINE HCL 25 MG PO TABS
25.0000 mg | ORAL_TABLET | Freq: Four times a day (QID) | ORAL | Status: DC | PRN
  Administered 2013-07-25: 25 mg via ORAL
  Filled 2013-07-24: qty 1

## 2013-07-24 MED ORDER — LISINOPRIL 20 MG PO TABS
30.0000 mg | ORAL_TABLET | Freq: Two times a day (BID) | ORAL | Status: DC
  Administered 2013-07-25 (×2): 30 mg via ORAL
  Filled 2013-07-24 (×7): qty 1

## 2013-07-24 MED ORDER — CHLORDIAZEPOXIDE HCL 25 MG PO CAPS
25.0000 mg | ORAL_CAPSULE | Freq: Four times a day (QID) | ORAL | Status: DC
  Administered 2013-07-24 – 2013-07-25 (×4): 25 mg via ORAL
  Filled 2013-07-24 (×3): qty 1

## 2013-07-24 MED ORDER — ACETAMINOPHEN 500 MG PO TABS
500.0000 mg | ORAL_TABLET | Freq: Four times a day (QID) | ORAL | Status: DC | PRN
  Administered 2013-07-24: 500 mg via ORAL
  Filled 2013-07-24: qty 1

## 2013-07-24 MED ORDER — DULOXETINE HCL 30 MG PO CPEP
30.0000 mg | ORAL_CAPSULE | Freq: Two times a day (BID) | ORAL | Status: DC
  Administered 2013-07-25 (×2): 30 mg via ORAL
  Filled 2013-07-24 (×4): qty 1

## 2013-07-24 MED ORDER — ARMODAFINIL 200 MG PO TABS
200.0000 mg | ORAL_TABLET | Freq: Every day | ORAL | Status: DC
  Filled 2013-07-24 (×2): qty 1

## 2013-07-24 MED ORDER — GABAPENTIN 400 MG PO CAPS
400.0000 mg | ORAL_CAPSULE | Freq: Every day | ORAL | Status: DC
  Administered 2013-07-25 (×2): 400 mg via ORAL
  Filled 2013-07-24 (×10): qty 1

## 2013-07-24 MED ORDER — IBUPROFEN 600 MG PO TABS
600.0000 mg | ORAL_TABLET | Freq: Four times a day (QID) | ORAL | Status: DC | PRN
  Administered 2013-07-24 – 2013-07-25 (×2): 600 mg via ORAL
  Filled 2013-07-24 (×3): qty 1

## 2013-07-24 MED ORDER — LOPERAMIDE HCL 2 MG PO CAPS: 2.0000 mg | ORAL_CAPSULE | ORAL | Status: DC | PRN

## 2013-07-24 MED ORDER — CLONIDINE HCL 0.1 MG PO TABS
0.1000 mg | ORAL_TABLET | Freq: Three times a day (TID) | ORAL | Status: DC
  Administered 2013-07-24: 0.1 mg via ORAL
  Filled 2013-07-24: qty 1

## 2013-07-24 MED ORDER — DULOXETINE HCL 30 MG PO CPEP
30.0000 mg | ORAL_CAPSULE | Freq: Two times a day (BID) | ORAL | Status: DC
  Administered 2013-07-24: 30 mg via ORAL
  Filled 2013-07-24 (×4): qty 1

## 2013-07-24 MED ORDER — LISINOPRIL 10 MG PO TABS
30.0000 mg | ORAL_TABLET | Freq: Two times a day (BID) | ORAL | Status: DC
  Administered 2013-07-24: 30 mg via ORAL
  Filled 2013-07-24: qty 3

## 2013-07-24 MED ORDER — CHLORDIAZEPOXIDE HCL 25 MG PO CAPS: 25.0000 mg | ORAL_CAPSULE | Freq: Three times a day (TID) | ORAL | Status: DC

## 2013-07-24 MED ORDER — BACITRACIN ZINC 500 UNIT/GM EX OINT
TOPICAL_OINTMENT | CUTANEOUS | Status: AC
  Filled 2013-07-24: qty 0.9

## 2013-07-24 MED ORDER — ESTRADIOL 0.05 MG/24HR TD PTWK: 0.0500 mg | MEDICATED_PATCH | TRANSDERMAL | Status: DC

## 2013-07-24 MED ORDER — CHLORDIAZEPOXIDE HCL 25 MG PO CAPS
25.0000 mg | ORAL_CAPSULE | Freq: Four times a day (QID) | ORAL | Status: DC | PRN
  Filled 2013-07-24: qty 1

## 2013-07-24 MED ORDER — CHLORDIAZEPOXIDE HCL 25 MG PO CAPS: 25.0000 mg | ORAL_CAPSULE | ORAL | Status: DC

## 2013-07-24 MED ORDER — GABAPENTIN 400 MG PO CAPS
400.0000 mg | ORAL_CAPSULE | Freq: Every day | ORAL | Status: DC
  Administered 2013-07-24: 400 mg via ORAL
  Filled 2013-07-24: qty 1

## 2013-07-24 MED ORDER — ESTRADIOL 0.05 MG/24HR TD PTWK
0.0500 mg | MEDICATED_PATCH | TRANSDERMAL | Status: DC
  Filled 2013-07-24: qty 1

## 2013-07-24 MED ORDER — TRAZODONE HCL 50 MG PO TABS
50.0000 mg | ORAL_TABLET | Freq: Every evening | ORAL | Status: DC | PRN
  Filled 2013-07-24 (×2): qty 1

## 2013-07-24 MED ORDER — CLONIDINE HCL 0.1 MG PO TABS
0.1000 mg | ORAL_TABLET | Freq: Three times a day (TID) | ORAL | Status: DC
  Administered 2013-07-25 (×3): 0.1 mg via ORAL
  Filled 2013-07-24 (×7): qty 1

## 2013-07-24 MED ORDER — PREGABALIN 50 MG PO CAPS
100.0000 mg | ORAL_CAPSULE | Freq: Every day | ORAL | Status: DC
  Administered 2013-07-24: 100 mg via ORAL
  Filled 2013-07-24: qty 2

## 2013-07-24 MED ORDER — PREGABALIN 50 MG PO CAPS
100.0000 mg | ORAL_CAPSULE | Freq: Every day | ORAL | Status: DC
  Administered 2013-07-25 (×2): 100 mg via ORAL
  Filled 2013-07-24 (×2): qty 2

## 2013-07-24 MED ORDER — ONDANSETRON 4 MG PO TBDP: 4.0000 mg | ORAL_TABLET | Freq: Four times a day (QID) | ORAL | Status: DC | PRN

## 2013-07-24 MED ORDER — IBUPROFEN 400 MG PO TABS: 600.0000 mg | ORAL_TABLET | Freq: Four times a day (QID) | ORAL | Status: DC | PRN

## 2013-07-24 MED ORDER — DULOXETINE HCL 30 MG PO CPEP
ORAL_CAPSULE | ORAL | Status: AC
  Filled 2013-07-24: qty 1

## 2013-07-24 MED ORDER — THIAMINE HCL 100 MG/ML IJ SOLN
100.0000 mg | Freq: Once | INTRAMUSCULAR | Status: AC
  Administered 2013-07-24: 100 mg via INTRAMUSCULAR
  Filled 2013-07-24: qty 2

## 2013-07-24 NOTE — ED Notes (Signed)
telepsy in progress 

## 2013-07-24 NOTE — ED Notes (Signed)
Contact number pt's boyfriend Jeanett Schlein [phone],

## 2013-07-24 NOTE — ED Notes (Signed)
Called AC to ask how to handled situation with this pt what bed 17 was telling she was told. AC Tama Headings will call risk management and advise.

## 2013-07-24 NOTE — ED Notes (Signed)
Pt denies SI or HI, states she is depressed, complaining of foot pain 9/10, pt is tearful.

## 2013-07-24 NOTE — ED Notes (Signed)
Sister came back to room, states she is worried about the pt, states she drinks night and day. " she is an Alcoholic" , state their father was killed in a car wreck Jan 2014 and she has gotten worse. Had to call out of work due to being drunk, Boyfriend threaten to leave her due to alcohol abuse. Sister is not sure if you is taking anything else has not seen it, but thinks it is a possible problem.

## 2013-07-24 NOTE — ED Notes (Signed)
Advised sister that visiting hours is over, Bethany Barton states she is  watching sister closely,  feels she might be trying to slip the pt some medication. Sister appears to be on some medication or alcohol, slowed speech, leaning forward when talking.
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
## 2013-07-24 NOTE — Progress Notes (Signed)
[facility] INPATIENT:  Family/Significant Other Suicide Prevention Education  Suicide Prevention Education:  Education Completed; Event organiser sister  has been identified by the patient as the family member/significant other with whom the patient will be residing, and identified as the person(s) who will aid the patient in the event of a mental health crisis (suicidal ideations/suicide attempt).  With written consent from the patient, the family member/significant other has been provided the following suicide prevention education, prior to the and/or following the discharge of the patient.  The suicide prevention education provided includes the following:  Suicide risk factors  Suicide prevention and interventions  National Suicide Hotline telephone number  [facility] assessment telephone number  [facility] Emergency Assistance 911  [facility] and/or Residential Mobile Crisis Unit telephone number  Request made of family/significant other to:  Remove weapons (e.g., guns, rifles, knives), all items previously/currently identified as safety concern.    Remove drugs/medications (over-the-counter, prescriptions, illicit drugs), all items previously/currently identified as a safety concern.  The family member/significant other verbalizes understanding of the suicide prevention education information provided.  The family member/significant other agrees to remove the items of safety concern listed above.  Celene Kras 07/24/2013, 11:40 PM
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
## 2013-07-24 NOTE — ED Notes (Signed)
MD at the bedside  
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
## 2013-07-24 NOTE — ED Notes (Addendum)
telepsy set up in room

## 2013-07-24 NOTE — ED Notes (Addendum)
Pt continue to yell out for pain medication, pt threaten to leave. States"I have to work a 12 hr shift at [employer] and needs to leave" MD aware, Sitter in waiting wanting to see pt. Pt advised until she calms down. Medication ordered, pt states she would like to see her sister, she will calm down. 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

## 2013-07-24 NOTE — ED Notes (Signed)
Pt getting in verbal altercation with another pt, boths pt yelling, asking to stay in their rooms, sitter in the hallway, pt feels like she is not being taken care of, wants soda and visitors, states other pt in ED are having visitors, explained to pt, we have visiting hours, due to her elevated alcohol level she is being monitored and restricted, gave pt sprite, Officers called

## 2013-07-24 NOTE — ED Notes (Signed)
Bacitracin applied to abrasion on elbow, pt ambulatory out of the dept with [facility], belonging given to driver, paperwork complete
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
## 2013-07-24 NOTE — ED Notes (Signed)
Boyfriend that lives with pt, called does not want pt going away, states their are rehab programs in [place] that she can go to, state pt sister is crazy and will not take her to get help.

## 2013-07-24 NOTE — ED Notes (Signed)
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

## 2013-07-24 NOTE — BH Assessment (Signed)
Tele Assessment Note   Bethany Barton is an 43 y.o. female who presents voluntarily to [facility] requesting alcohol and opiate detox. Pt was brought in by police after boyfriend called due to her being intoxicated. Per medical notes pt's son called pt's boyfriend when pt was drunk and falling down at home.  Pt reported she binge drinks, 1-2 times a week since 01/2013 when it was approaching the anniversary of her father's death. Pt also reported her boyfriend is a truck driver and not home often so she takes care of her 2 children and his child alone.  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 Pt reported being overwhelmed.  Pt also reported back pain and stated she has been taking her son's oxycodone over the past 2-3 weeks since his back surgery. Pt stated she took 80 pills in one week. Pt denied hx of suicide attempts. Pt stated "I'm just in pain."  Pt also reported "recreational" cocaine use.   Pt denies SI, HI, AVH. Pt Ox4. Pt reported depressed and anxious mood with congruent affect. Pt reported substance abuse treatment hx both inpatient and outpatient. Pt requested not to be placed at [facility] due to her being an employee in the [employer] health system.   Axis I: Alcohol Use Disorder, severe; Opioid Use Disorder; severe Axis II: Deferred Axis III:  Past Medical History  Diagnosis Date  . Hypertension   . Peripheral neuropathy   . Facial tic   . Depression   . Diabetes mellitus without complication     Past Medical History:  Past Medical History  Diagnosis Date  . Hypertension   . Peripheral neuropathy   . Facial tic   . Depression   . Diabetes mellitus without complication     Past Surgical History  Procedure Laterality Date  . 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 Abdominal hysterectomy  2013  . Ankle fusion Right 1991  . Appendectomy  1987  . Cesarean section  1997  . Abdominoplasty  2013    Family History:  Family History   Problem Relation Age of Onset  . Bipolar disorder Sister   . Drug abuse Sister   . Schizophrenia Son   . Bipolar disorder Father   . Alcohol abuse Father   . Drug abuse Mother   . Alcohol abuse Mother     Social History:  reports that she has never smoked. She does not have any smokeless tobacco history on file. She reports that she drinks about 4.8 ounces of alcohol per week. She reports that she uses illicit drugs (Oxycodone).  Additional Social History:  Alcohol / Drug Use Pain Medications: none reported Prescriptions: Estrogen, Progesstin, Testosterone, Lyrica, Nerotin, Nuevagil, Cymbalta Over the Counter: none reported History of alcohol / drug use?: Yes Longest period of sobriety (when/how long): 16 years Substance #1 Name of Substance 1: ETOH 1 - Age of First Use: unk 1 - Amount (size/oz): 2 cartons of wine 1 - Frequency: 1-2 week; binge drinking 1 - Duration: since 01/2013 1 - Last Use / Amount: 07/24/13 Substance #2 Name of Substance 2: oxycodone 2 - Age of First Use: unk 2 - Amount (size/oz): varies; pt stated 80 pills in one week 2 - Frequency: daily 2 - Duration: 2-3 weeks 2 - Last Use / Amount: 07/24/13 Substance #3 Name of Substance 3: cocaine 3 - Age of First Use: unk 3 - Amount (size/oz): unk  3 - Frequency: occasional 3 - Duration: ongoing 3 - Last Use / Amount: 07/21/13  CIWA: CIWA-Ar BP: 166/110 mmHg Pulse Rate: 84 COWS:    Allergies:  Allergies  Allergen Reactions  . Iodine Other (See Comments)    Blisters  . Neomycin Other (See Comments)    Blisters   . Phenergan [Promethazine Hcl] Other (See Comments)    Altered Mental Status    Home Medications:  (Not in a hospital admission)  OB/GYN Status:  No LMP recorded. Patient has had a hysterectomy.  General Assessment Data Location of Assessment: [facility] Is this a Tele or Face-to-Face Assessment?: Tele Assessment Is this an Initial Assessment or a Re-assessment for this encounter?: Initial  Assessment Living Arrangements: Children;Spouse/significant other Can pt return to current living arrangement?: Yes Admission Status: Voluntary Is patient capable of signing voluntary admission?: Yes Transfer from: Acute Hospital Referral Source: Self/Family/Friend  Medical Screening Exam [facility] Walk-in ONLY) Medical Exam completed: Yes  [facility] Living Arrangements: Children;Spouse/significant other Name of Psychiatrist:  (Dr. Noralyn Pickoomqui and Dr. Jackie PlumGusrani) Name of Therapist:  (none )     Risk to self Suicidal Ideation: No Suicidal Intent: No Is patient at risk for suicide?: No Suicidal Plan?: No Access to Means: No What has been your use of drugs/alcohol within the last 12 months?:  (yes) Previous Attempts/Gestures: No How many times?:  (0) Other Self Harm Risks:  (none reported) Triggers for Past Attempts: None known Intentional Self Injurious Behavior: None Family Suicide History: Unknown Recent stressful life event(s): Loss (Comment) (Pt reported anniversary of father's death. ) Persecutory voices/beliefs?: No Depression: Yes Depression Symptoms: Despondent;Insomnia;Tearfulness;Fatigue;Feeling worthless/self pity Substance abuse history and/or treatment for substance abuse?: Yes Suicide prevention information given to non-admitted patients: Not applicable  Risk to Others Homicidal Ideation: No Thoughts of Harm to Others: No Current Homicidal Intent: No Current Homicidal Plan: No Access to Homicidal Means: No Identified Victim:  (NA) History of harm to others?: No Assessment of Violence: None Noted Violent Behavior Description:  (Pt was calm and cooperative during assessment. ) Does patient have access to weapons?: No Criminal Charges Pending?: No Does patient have a court date: No  Psychosis Hallucinations: None noted Delusions: None noted  Mental Status Report Appear/Hygiene: In scrubs Eye Contact: Good Motor Activity: Unremarkable Speech:  Logical/coherent Level of Consciousness: Alert Mood: Depressed;Anxious Affect: Anxious;Depressed Anxiety Level: Minimal Thought Processes: Coherent;Relevant Judgement: Unimpaired Orientation: Person;Place;Time;Situation Obsessive Compulsive Thoughts/Behaviors: None  Cognitive Functioning Concentration: Normal Memory: Recent Intact;Remote Intact IQ: Average Insight: Fair Impulse Control: Poor Appetite: Fair Weight Loss:  (unk) Weight Gain:  (unk) Sleep: Decreased Total Hours of Sleep:  (6) Vegetative Symptoms: None  ADLScreening [facility] Assessment Services) Patient's cognitive ability adequate to safely complete daily activities?: Yes Patient able to express need for assistance with ADLs?: Yes Independently performs ADLs?: Yes (appropriate for developmental age)  Prior Inpatient Therapy Prior Inpatient Therapy: Yes Prior Therapy Dates:  (04/2010) Prior Therapy Facilty/Provider(s):  [facility] [place] Reason for Treatment:  (substance abuse tx)  Prior Outpatient Therapy Prior Outpatient Therapy: Yes Prior Therapy Dates:  (07/2012- 09/2012) Prior Therapy Facilty/Provider(s[facility]  [facility] Reason for Treatment:  (IOP)  ADL Screening (condition at time of admission) Patient's cognitive ability adequate to safely complete daily activities?: Yes Is the patient deaf or have difficulty hearing?: No Does the patient have difficulty seeing, even when wearing glasses/contacts?: No Does the patient have difficulty concentrating, remembering, or making decisions?: No Patient able to express need for assistance with ADLs?:  Yes Does the patient have difficulty dressing or bathing?: No Independently performs ADLs?: Yes (appropriate for developmental age)       Abuse/Neglect Assessment (Assessment to be complete while patient is alone) Physical Abuse: Denies Verbal Abuse: Denies Sexual Abuse: Denies Exploitation of patient/patient's resources: Denies Self-Neglect: Denies Values  / Beliefs Cultural Requests During Hospitalization: None Spiritual Requests During Hospitalization: None        Additional Information 1:1 In Past 12 Months?: No CIRT Risk: No Elopement Risk: No Does patient have medical clearance?: Yes    Disposition: Pt meets criteria for inpatient detox per Donell Sievert, PA. Pt to be transferred to [facility] Observation Unit.  Disposition Initial Assessment Completed for this Encounter: Yes Disposition of Patient: Inpatient treatment program Type of inpatient treatment program: Adult  Alric Quan 07/24/2013 9:15 PM
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
## 2013-07-24 NOTE — ED Notes (Addendum)
Dorene Sorrow asking question about drug screen, pt state ok  To tell him, I ask pt if she would just tell him what would show up. She stated it would show cocaine, Roxycodone, and benzo. Dorene Sorrow ask her where she got roxycodone, she state she took them from her son prescription. He ask were she got Klonopin, she has a script but he does not get it filled, because it makes her drunk. He states that her son took 5 pills of klonopin out of her hand before police got there. She would not say were she got the pills.

## 2013-07-24 NOTE — BH Assessment (Signed)
Clinician consulted with Donell Sievert, PA who recommends pt for inpatient detox. Due to pt being employed by [employer], pt requests treatment referral outside of [facility] system. Clinician provided updates to Dr. Jeraldine Loots who stated Administration requests pt to be seen by [facility]. Per Dr. Jeraldine Loots pt is voluntarily coming to [facility].  Clint Bolder, [facility] confirms availability at [facility]. Pt recommended to be seen at Obs unit initially before transition to inpatient unit. Pt to be transferred to Obs unit.   Yaakov Guthrie, MSW, LCSW Triage Specialist [phone]

## 2013-07-24 NOTE — ED Notes (Signed)
Joyice Faster- boyfriend at the bedside, Per Pt she wants him to have access to her records, witness per sitter, telepsy set up in room

## 2013-07-24 NOTE — ED Notes (Signed)
Pt did give permission to speak with pt's boyfriend [name] [phone],

## 2013-07-25 ENCOUNTER — Encounter (HOSPITAL_COMMUNITY): Payer: Self-pay | Admitting: *Deleted

## 2013-07-25 MED ORDER — LISINOPRIL 30 MG PO TABS: 30.0000 mg | ORAL_TABLET | Freq: Two times a day (BID) | ORAL | Status: DC

## 2013-07-25 MED ORDER — ACETAMINOPHEN 325 MG PO TABS
650.0000 mg | ORAL_TABLET | Freq: Four times a day (QID) | ORAL | Status: DC | PRN
  Administered 2013-07-25: 650 mg via ORAL
  Filled 2013-07-25: qty 2

## 2013-07-25 MED ORDER — GABAPENTIN 400 MG PO CAPS
400.0000 mg | ORAL_CAPSULE | Freq: Once | ORAL | Status: AC
  Administered 2013-07-25: 400 mg via ORAL
  Filled 2013-07-25: qty 1

## 2013-07-25 MED ORDER — CELECOXIB 100 MG PO CAPS
100.0000 mg | ORAL_CAPSULE | Freq: Two times a day (BID) | ORAL | Status: DC
  Administered 2013-07-25 (×2): 100 mg via ORAL
  Filled 2013-07-25 (×5): qty 1

## 2013-07-25 MED ORDER — ESTRADIOL 0.025 MG/24HR TD PTTW: 1.0000 | MEDICATED_PATCH | TRANSDERMAL | Status: DC

## 2013-07-25 MED ORDER — PREGABALIN 50 MG PO CAPS
100.0000 mg | ORAL_CAPSULE | Freq: Once | ORAL | Status: AC
  Administered 2013-07-25: 100 mg via ORAL
  Filled 2013-07-25: qty 2

## 2013-07-25 MED ORDER — DULOXETINE HCL 30 MG PO CPEP: 30.0000 mg | ORAL_CAPSULE | Freq: Two times a day (BID) | ORAL | Status: DC

## 2013-07-25 MED ORDER — PREGABALIN 75 MG PO CAPS
200.0000 mg | ORAL_CAPSULE | Freq: Three times a day (TID) | ORAL | Status: DC
  Administered 2013-07-25 (×2): 200 mg via ORAL
  Filled 2013-07-25: qty 2
  Filled 2013-07-25: qty 1
  Filled 2013-07-25: qty 2
  Filled 2013-07-25: qty 1

## 2013-07-25 MED ORDER — GABAPENTIN 400 MG PO CAPS
800.0000 mg | ORAL_CAPSULE | Freq: Three times a day (TID) | ORAL | Status: DC
  Administered 2013-07-25 (×2): 800 mg via ORAL
  Filled 2013-07-25 (×5): qty 2

## 2013-07-25 MED ORDER — CELECOXIB 100 MG PO CAPS: 100.0000 mg | ORAL_CAPSULE | Freq: Two times a day (BID) | ORAL | Status: DC

## 2013-07-25 MED ORDER — MODAFINIL 200 MG PO TABS
100.0000 mg | ORAL_TABLET | Freq: Every day | ORAL | Status: DC
  Administered 2013-07-25: 100 mg via ORAL
  Filled 2013-07-25 (×2): qty 1

## 2013-07-25 MED ORDER — LOPERAMIDE HCL 2 MG PO CAPS: 2.0000 mg | ORAL_CAPSULE | Freq: Three times a day (TID) | ORAL | Status: DC | PRN

## 2013-07-25 MED ORDER — GABAPENTIN 400 MG PO CAPS: 400.0000 mg | ORAL_CAPSULE | Freq: Every day | ORAL | Status: DC

## 2013-07-25 MED ORDER — PREGABALIN 100 MG PO CAPS: 100.0000 mg | ORAL_CAPSULE | Freq: Every day | ORAL | Status: DC

## 2013-07-25 MED ORDER — CLONIDINE HCL 0.1 MG PO TABS: 0.1000 mg | ORAL_TABLET | Freq: Three times a day (TID) | ORAL | Status: DC

## 2013-07-25 NOTE — Progress Notes (Signed)
This is a 43 Years old caucasian female admitted to observations unit this evening. 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 She stated that she started drinking heavily after the death of his father last year 03/27/22. She said her mother is in jail, son on drugs, divorced, lives with boy friend and his daughter and the daughter talks back and doesn't listen to her. Endorsed multiple home problems. She also reported stress related to her job, "2 kids died within the last few weeks on my unit".  She endorsed family history of mental illness. She reported that she has been drinking since she was 43 years old, sober for 7 months and started drinking after her father died. Patient's mood and affect sad and depressed. She has an abrasion on her left elbow and left ankle. Writer oriented patient to the unit and Q 15 minute check initiated.
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
## 2013-07-25 NOTE — Discharge Summary (Signed)
[facility] OBS Discharge Summary  Patient Identification:  Bethany Barton Date of Evaluation:  07/25/2013 Chief Complaint:  Alcohol Use Disorder  *Pt seen and evaluated in the OBS Unit and determined to meet discharge criteria today. Pt evaluated once in one day due to short length of stay. I agree with the findings below and pt will be discharged with the specific details as outlined in these narratives (typed by myself).    History of Present Illness:: Bethany Barton is an 43 y.o. female who presents voluntarily to [facility] requesting alcohol and opiate detox. Pt was brought in by police after boyfriend called due to her being intoxicated. Per medical notes pt's son called pt's boyfriend when pt was drunk and falling down at home. Pt reported she binge drinks, 1-2 times a week since 01/2013 when it was approaching the anniversary of her father's death. Pt also reported her boyfriend is a truck driver and not home often so she takes care of her 2 children and his child alone. 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 Pt reported being overwhelmed. Pt also reported back pain and stated she has been taking her son's oxycodone over the past 2-3 weeks since his back surgery. Pt stated she took 80 pills in one week. Pt denied hx of suicide attempts. Pt stated "I'm just in pain." Pt also reported "recreational" cocaine use.  Pt denies SI, HI, AVH. Pt Ox4. Pt reported depressed and anxious mood with congruent affect. Pt reported substance abuse treatment hx both inpatient and outpatient. Pt requested not to be placed at [facility] due to her being an employee in the [employer] health system.   Subjective: Pt seen and chart reviewed. Pt denies SI, HI, and AVH, contracts for safety. Pt reports that she fell yesterday when she was intoxicated and is asking for antibiotic cream. Pt reports feeling depressed and also in chronic pain. Pt was informed by this NP that she will not be prescribed narcotics  while in [facility] OBS Unit. Pt affirmed understanding.  Pt states that she would like alcohol and opiate detoxification. Pt reports drinking 3 glasses of wine 3 times per week and states that this makes her "black out and be violent around her children". Pt reports that she "never drinks on days that she is working". Pt hesitates with some of her statements and her affect is somewhat inconsistent with her subjective reporting. Her intonation is also incongruent and she appears to be enthusiastic about some topics while presenting with flat affect immediately after with other topics, which makes it difficult to determine the genuineness of her statements. Will continue to monitor for consistency between subjective and objective findings to assist patient in determining the most beneficial disposition. Pt reports that "Sonia Side" is her boyfriend and that him and her sister are her main support systems. Pt also reports having an 65 yr old son and a 90yo son who just had back surgery. Pt spoke with Dr. Dwyane Dee and is considering a long-term treatment facility with FMLA leave; pt will talk to Tonette Bihari to determine disposition plans via conference call with her boyfriend and sister.    Elements:  Location:  Generalized, [facility] OBS UNIT. Quality:  Worsening. Severity:  Severe. Timing:  Constant. Duration:  Chronic. Context:  Pt has been increasingly depressed since the death of her father in May 18, 2012 and also states that 2 patients have recently died under her care. Pt also reports that she has been  taking her son's narcotics.. Associated Signs/Synptoms: Depression Symptoms:  depressed mood, anhedonia, insomnia, psychomotor retardation, fatigue, feelings of worthlessness/guilt, difficulty concentrating, hopelessness, anxiety, (Hypo) Manic Symptoms:  Impulsivity, Anxiety Symptoms:  Excessive Worry, Psychotic Symptoms:  Denies PTSD Symptoms: Denies  Total Time spent with patient: 40 minutes  Psychiatric Specialty  Exam: Physical Exam Full Physical Exam performed in ED; reviewed, stable, and I concur with this assessment.   Review of Systems  Constitutional: Negative.   HENT: Negative.   Eyes: Negative.   Respiratory: Negative.   Gastrointestinal: Negative.   Genitourinary: Negative.   Musculoskeletal: Positive for myalgias.  Skin: Negative.   Neurological: Negative.   Endo/Heme/Allergies: Negative.   Psychiatric/Behavioral: Positive for depression and substance abuse. The patient is nervous/anxious and has insomnia.     Blood pressure 102/70, pulse 76, temperature 97.7 F (36.5 C), temperature source Oral, resp. rate 18, height 5' 3.5" (1.613 m), weight 82.9 kg (182 lb 12.2 oz).Body mass index is 31.86 kg/(m^2).  General Appearance: Casual  Eye Contact::  Good  Speech:  Clear and Coherent  Volume:  Decreased  Mood:  Anxious and Depressed  Affect:  Non-Congruent and Flat  Thought Process:  Coherent  Orientation:  Full (Time, Place, and Person)  Thought Content:  WDL  Suicidal Thoughts:  No  Homicidal Thoughts:  No  Memory:  Immediate;   Fair Recent;   Fair Remote;   Fair  Judgement:  Fair  Insight:  Fair  Psychomotor Activity:  Normal  Concentration:  Fair  Recall:  NA  Fund of Knowledge:Good  Language: Good  Akathisia:  No  Handed:  Right  AIMS (if indicated):     Assets:  Communication Skills Desire for Improvement Financial Resources/Insurance Housing Resilience Social Support Talents/Skills Vocational/Educational  Sleep:       Musculoskeletal: Strength & Muscle Tone: within normal limits Gait & Station: normal Patient leans: N/A  Past Medical History:   Past Medical History  Diagnosis Date  . Hypertension   . Peripheral neuropathy   . Facial tic   . Depression   . Diabetes mellitus without complication    Loss of Consciousness:  blackouts every time she drinks wine (3x weekly), for a few minutes Allergies:   Allergies  Allergen Reactions  . Iodine Other  (See Comments)    Blisters  . Neomycin Other (See Comments)    Blisters   . Phenergan [Promethazine Hcl] Other (See Comments)    Altered Mental Status   PTA Medications: Prescriptions prior to admission  Medication Sig Dispense Refill  . acetaminophen (TYLENOL) 500 MG tablet Take 500 mg by mouth every 6 (six) hours as needed.      . Armodafinil (NUVIGIL) 200 MG TABS Take 200 mg by mouth daily.       . Ascorbic Acid (VITAMIN C) 1000 MG tablet Take 1,000 mg by mouth daily.      . ferrous sulfate 325 (65 FE) MG tablet Take 325 mg by mouth daily with breakfast.      . ibuprofen (ADVIL,MOTRIN) 200 MG tablet Take 600-800 mg by mouth every 6 (six) hours as needed for moderate pain.      . Multiple Vitamin (MULTIVITAMIN WITH MINERALS) TABS tablet Take 1 tablet by mouth daily.      . Multiple Vitamins-Minerals (ZINC PO) Take 1 tablet by mouth daily.      . naproxen sodium (ALEVE) 220 MG tablet Take 220-440 mg by mouth 2 (two) times daily as needed (Pain).      Marland Kitchen  PRESCRIPTION MEDICATION Apply 1 application topically 2 (two) times daily. Compounded Cream made to help nerve pain.      . [DISCONTINUED] cloNIDine (CATAPRES) 0.1 MG tablet Take 0.1 mg by mouth 3 (three) times daily.      . [DISCONTINUED] DULoxetine (CYMBALTA) 30 MG capsule Take 30 mg by mouth 2 (two) times daily.      . [DISCONTINUED] estradiol (VIVELLE-DOT) 0.025 MG/24HR Place 1 patch onto the skin 2 (two) times a week.      . [DISCONTINUED] gabapentin (NEURONTIN) 400 MG capsule Take 400 mg by mouth 6 (six) times daily.      . [DISCONTINUED] lisinopril (PRINIVIL,ZESTRIL) 30 MG tablet Take 30 mg by mouth 2 (two) times daily.      . [DISCONTINUED] pregabalin (LYRICA) 100 MG capsule Take 100 mg by mouth 6 (six) times daily.        Previous Psychotropic Medications:  Medication/Dose  SEE MAR               Substance Abuse History in the last 12 months:  yes  Consequences of Substance Abuse: Medical Consequences:   Hospitalization Family Consequences:  Family dynamics Blackouts:    Social History:  reports that she has never smoked. She does not have any smokeless tobacco history on file. She reports that she drinks about 4.8 ounces of alcohol per week. She reports that she uses illicit drugs (Oxycodone). Additional Social History: Pain Medications: Oxycodone History of alcohol / drug use?: Yes Name of Substance 1: Alcohol 1 - Age of First Use: 15 1 - Amount (size/oz): 3 glasses of wine                   Family History:   Family History  Problem Relation Age of Onset  . Bipolar disorder Sister   . Drug abuse Sister   . Schizophrenia Son   . Bipolar disorder Father   . Alcohol abuse Father   . Drug abuse Mother   . Alcohol abuse Mother     Results for orders placed during the hospital encounter of 07/24/13 (from the past 72 hour(s))  CBC WITH DIFFERENTIAL     Status: None   Collection Time    07/24/13  3:45 PM      Result Value Ref Range   WBC 8.4  4.0 - 10.5 K/uL   RBC 5.00  3.87 - 5.11 MIL/uL   Hemoglobin 14.4  12.0 - 15.0 g/dL   HCT 42.9  36.0 - 46.0 %   MCV 85.8  78.0 - 100.0 fL   MCH 28.8  26.0 - 34.0 pg   MCHC 33.6  30.0 - 36.0 g/dL   RDW 13.1  11.5 - 15.5 %   Platelets 260  150 - 400 K/uL   Neutrophils Relative % 69  43 - 77 %   Neutro Abs 5.7  1.7 - 7.7 K/uL   Lymphocytes Relative 25  12 - 46 %   Lymphs Abs 2.1  0.7 - 4.0 K/uL   Monocytes Relative 5  3 - 12 %   Monocytes Absolute 0.4  0.1 - 1.0 K/uL   Eosinophils Relative 1  0 - 5 %   Eosinophils Absolute 0.1  0.0 - 0.7 K/uL   Basophils Relative 0  0 - 1 %   Basophils Absolute 0.0  0.0 - 0.1 K/uL  COMPREHENSIVE METABOLIC PANEL     Status: Abnormal   Collection Time    07/24/13  3:45 PM  Result Value Ref Range   Sodium 149 (*) 137 - 147 mEq/L   Potassium 3.9  3.7 - 5.3 mEq/L   Chloride 108  96 - 112 mEq/L   CO2 25  19 - 32 mEq/L   Glucose, Bld 95  70 - 99 mg/dL   BUN 7  6 - 23 mg/dL   Creatinine, Ser  0.76  0.50 - 1.10 mg/dL   Calcium 8.7  8.4 - 10.5 mg/dL   Total Protein 7.2  6.0 - 8.3 g/dL   Albumin 3.6  3.5 - 5.2 g/dL   AST 24  0 - 37 U/L   ALT 23  0 - 35 U/L   Alkaline Phosphatase 124 (*) 39 - 117 U/L   Total Bilirubin <0.2 (*) 0.3 - 1.2 mg/dL   GFR calc non Af Amer >90  >90 mL/min   GFR calc Af Amer >90  >90 mL/min   Comment: (NOTE)     The eGFR has been calculated using the CKD EPI equation.     This calculation has not been validated in all clinical situations.     eGFR's persistently <90 mL/min signify possible Chronic Kidney     Disease.  ETHANOL     Status: Abnormal   Collection Time    07/24/13  3:45 PM      Result Value Ref Range   Alcohol, Ethyl (B) 227 (*) 0 - 11 mg/dL   Comment:            LOWEST DETECTABLE LIMIT FOR     SERUM ALCOHOL IS 11 mg/dL     FOR MEDICAL PURPOSES ONLY  URINALYSIS, ROUTINE W REFLEX MICROSCOPIC     Status: Abnormal   Collection Time    07/24/13  5:22 PM      Result Value Ref Range   Color, Urine YELLOW  YELLOW   APPearance CLOUDY (*) CLEAR   Specific Gravity, Urine 1.025  1.005 - 1.030   pH 6.0  5.0 - 8.0   Glucose, UA NEGATIVE  NEGATIVE mg/dL   Hgb urine dipstick TRACE (*) NEGATIVE   Bilirubin Urine NEGATIVE  NEGATIVE   Ketones, ur NEGATIVE  NEGATIVE mg/dL   Protein, ur NEGATIVE  NEGATIVE mg/dL   Urobilinogen, UA 0.2  0.0 - 1.0 mg/dL   Nitrite NEGATIVE  NEGATIVE   Leukocytes, UA LARGE (*) NEGATIVE  URINE RAPID DRUG SCREEN (HOSP PERFORMED)     Status: Abnormal   Collection Time    07/24/13  5:22 PM      Result Value Ref Range   Opiates NONE DETECTED  NONE DETECTED   Cocaine POSITIVE (*) NONE DETECTED   Benzodiazepines POSITIVE (*) NONE DETECTED   Amphetamines NONE DETECTED  NONE DETECTED   Tetrahydrocannabinol NONE DETECTED  NONE DETECTED   Barbiturates NONE DETECTED  NONE DETECTED   Comment:            DRUG SCREEN FOR MEDICAL PURPOSES     ONLY.  IF CONFIRMATION IS NEEDED     FOR ANY PURPOSE, NOTIFY LAB     WITHIN 5 DAYS.                 LOWEST DETECTABLE LIMITS     FOR URINE DRUG SCREEN     Drug Class       Cutoff (ng/mL)     Amphetamine      1000     Barbiturate      200     Benzodiazepine   200  Tricyclics       694     Opiates          300     Cocaine          300     THC              50  URINE MICROSCOPIC-ADD ON     Status: Abnormal   Collection Time    07/24/13  5:22 PM      Result Value Ref Range   WBC, UA TOO NUMEROUS TO COUNT  <3 WBC/hpf   RBC / HPF 0-2  <3 RBC/hpf   Bacteria, UA MANY (*) RARE   Psychological Evaluations:  Assessment:   DSM5:  Substance/Addictive Disorders:  Alcohol Related Disorder - Severe (303.90) and Opioid Disorder - Severe (304.00) Depressive Disorders:  Major Depressive Disorder - Severe (296.23)  AXIS I:  Alcohol Abuse, Substance Abuse and Substance Induced Mood Disorder AXIS II:  Deferred AXIS III:   Past Medical History  Diagnosis Date  . Hypertension   . Peripheral neuropathy   . Facial tic   . Depression   . Diabetes mellitus without complication    AXIS IV:  other psychosocial or environmental problems and problems related to social environment AXIS V:  41-50 serious symptoms  Treatment Plan/Recommendations:   Review of chart, vital signs, medications, and notes.  1-Medication management for depression and anxiety: Medications reviewed with the patient and she stated no untoward effects.   -Discontinue Ibuprofen -Started Celebrex 11m bid (to replace ibuprofen) -Discontinue Armodafinil -Start Modafinil 10102mdaily (to replace Armodafinil; pharmacy does not stock this) -Changed Neurontin from 6x a day to tid per guidelines -Changed Lyrica from 6x a day to tid per guidelines    2-Coping skills for depression, anxiety  3-Continue crisis stabilization and management  4-Address health issues--monitoring vital signs, stable  5-Treatment plan in progress to prevent relapse of depression and anxiety  Treatment Plan Summary: Daily contact  with patient to assess and evaluate symptoms and progress in treatment Medication management Current Medications:  Current Facility-Administered Medications  Medication Dose Route Frequency Provider Last Rate Last Dose  . acetaminophen (TYLENOL) tablet 650 mg  650 mg Oral Q6H PRN JoBenjamine MolaFNP   650 mg at 07/25/13 1616  . celecoxib (CELEBREX) capsule 100 mg  100 mg Oral BID JoBenjamine MolaFNP   100 mg at 07/25/13 1705  . chlordiazePOXIDE (LIBRIUM) capsule 25 mg  25 mg Oral Q6H PRN SpLaverle HobbyPA-C      . chlordiazePOXIDE (LIBRIUM) capsule 25 mg  25 mg Oral QID SpLaverle HobbyPA-C   25 mg at 07/25/13 1705   Followed by  . [START ON 07/26/2013] chlordiazePOXIDE (LIBRIUM) capsule 25 mg  25 mg Oral TID SpLaverle HobbyPA-C       Followed by  . [START ON 07/27/2013] chlordiazePOXIDE (LIBRIUM) capsule 25 mg  25 mg Oral BH-qamhs Spencer E Simon, PA-C       Followed by  . [START ON 07/28/2013] chlordiazePOXIDE (LIBRIUM) capsule 25 mg  25 mg Oral Daily SpLaverle HobbyPA-C      . cloNIDine (CATAPRES) tablet 0.1 mg  0.1 mg Oral TID SpLaverle HobbyPA-C   0.1 mg at 07/25/13 1705  . DULoxetine (CYMBALTA) DR capsule 30 mg  30 mg Oral BID SpLaverle HobbyPA-C   30 mg at 07/25/13 1705  . [START ON 07/31/2013] estradiol (CLIMARA - Dosed in mg/24 hr) patch 0.05  mg  0.05 mg Transdermal Weekly Laverle Hobby, PA-C      . ferrous sulfate tablet 325 mg  325 mg Oral Q breakfast Laverle Hobby, PA-C   325 mg at 07/25/13 2202  . gabapentin (NEURONTIN) capsule 800 mg  800 mg Oral TID Benjamine Mola, FNP   800 mg at 07/25/13 1706  . hydrOXYzine (ATARAX/VISTARIL) tablet 25 mg  25 mg Oral Q6H PRN Laverle Hobby, PA-C   25 mg at 07/25/13 0409  . lisinopril (PRINIVIL,ZESTRIL) tablet 30 mg  30 mg Oral BID Laverle Hobby, PA-C   30 mg at 07/25/13 1706  . loperamide (IMODIUM) capsule 2 mg  2 mg Oral TID PRN Benjamine Mola, FNP      . modafinil (PROVIGIL) tablet 100 mg  100 mg Oral Daily Benjamine Mola, FNP    100 mg at 07/25/13 1012  . ondansetron (ZOFRAN-ODT) disintegrating tablet 4 mg  4 mg Oral Q6H PRN Laverle Hobby, PA-C      . pregabalin (LYRICA) capsule 200 mg  200 mg Oral TID Benjamine Mola, FNP   200 mg at 07/25/13 1706  . thiamine (VITAMIN B-1) tablet 100 mg  100 mg Oral Daily Laverle Hobby, PA-C   100 mg at 07/25/13 0841  . traZODone (DESYREL) tablet 50 mg  50 mg Oral QHS,MR X 1 Laverle Hobby, PA-C         Elyse Jarvis [place], [place] 5/27/20157:14 PM   Agree with assessment and plan Geralyn Flash A. Sabra Heck, M.D.

## 2013-07-25 NOTE — Progress Notes (Signed)
DISCHARGE NOTE:   Reviewed discharge instructions with patient. Patient verbalized understanding of the instructions. She denied SI/HI and denied Hallucinations. Items locked up on admission returned to patient. Patient to follow up with [facility] immediately. Patient discharged with her sister to transport her to the treatment facility.

## 2013-07-25 NOTE — Discharge Instructions (Addendum)
You have been accepted to [facility].  They will be expecting you tonight.  They indicate that if you arrive after 10:30 pm you will need to have them buzz you into the facility.       [facility]      [address]      [place], [place] [address]      [phone]

## 2013-07-25 NOTE — H&P (Signed)
[facility] H&P   Patient Identification:  Bethany Barton Date of Evaluation:  07/25/2013 Chief Complaint:  Alcohol Use Disorder History of Present Illness:: Bethany Barton is an 43 y.o. female who presents voluntarily to [facility] requesting alcohol and opiate detox. Pt was brought in by police after boyfriend called due to her being intoxicated. Per medical notes pt's son called pt's boyfriend when pt was drunk and falling down at home. Pt reported she binge drinks, 1-2 times a week since 01/2013 when it was approaching the anniversary of her father's death. Pt also reported her boyfriend is a truck driver and not home often so she takes care of her 2 children and his child alone. 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 Pt reported being overwhelmed. Pt also reported back pain and stated she has been taking her son's oxycodone over the past 2-3 weeks since his back surgery. Pt stated she took 80 pills in one week. Pt denied hx of suicide attempts. Pt stated "I'm just in pain." Pt also reported "recreational" cocaine use.  Pt denies SI, HI, AVH. Pt Ox4. Pt reported depressed and anxious mood with congruent affect. Pt reported substance abuse treatment hx both inpatient and outpatient. Pt requested not to be placed at [facility] due to her being an employee in the [facility] health system.   Subjective: Pt seen and chart reviewed. Pt denies SI, HI, and AVH, contracts for safety. Pt reports that she fell yesterday when she was intoxicated and is asking for antibiotic cream. Pt reports feeling depressed and also in chronic pain. Pt was informed by this NP that she will not be prescribed narcotics while in [facility] OBS Unit. Pt affirmed understanding.  Pt states that she would like alcohol and opiate detoxification. Pt reports drinking 3 glasses of wine 3 times per week and states that this makes her "black out and be violent around her children". Pt reports that she "never drinks on days that she is  working". Pt hesitates with some of her statements and her affect is somewhat inconsistent with her subjective reporting. Her intonation is also incongruent and she appears to be enthusiastic about some topics while presenting with flat affect immediately after with other topics, which makes it difficult to determine the genuineness of her statements. Will continue to monitor for consistency between subjective and objective findings to assist patient in determining the most beneficial disposition. Pt reports that "Sonia Side" is her boyfriend and that him and her sister are her main support systems. Pt also reports having an 23 yr old son and a 58yo son who just had back surgery. Pt spoke with Dr. Dwyane Dee and is considering a long-term treatment facility with FMLA leave; pt will talk to Tonette Bihari to determine disposition plans via conference call with her boyfriend and sister.    Elements:  Location:  Generalized, [facility] OBS UNIT. Quality:  Worsening. Severity:  Severe. Timing:  Constant. Duration:  Chronic. Context:  Pt has been increasingly depressed since the death of her father in 05/07/12 and also states that 2 patients have recently died under her care. Pt also reports that she has been taking her son's narcotics.. Associated Signs/Synptoms: Depression Symptoms:  depressed mood, anhedonia, insomnia, psychomotor retardation, fatigue, feelings of worthlessness/guilt, difficulty concentrating, hopelessness, anxiety, (Hypo) Manic Symptoms:  Impulsivity, Anxiety Symptoms:  Excessive Worry, Psychotic Symptoms:  Denies PTSD Symptoms: Denies  Total Time spent with patient: 40 minutes  Psychiatric Specialty Exam: Physical Exam Full  Physical Exam performed in ED; reviewed, stable, and I concur with this assessment.   Review of Systems  Constitutional: Negative.   HENT: Negative.   Eyes: Negative.   Respiratory: Negative.   Gastrointestinal: Negative.   Genitourinary: Negative.   Musculoskeletal:  Positive for myalgias.  Skin: Negative.   Neurological: Negative.   Endo/Heme/Allergies: Negative.   Psychiatric/Behavioral: Positive for depression and substance abuse. The patient is nervous/anxious and has insomnia.     Blood pressure 103/72, pulse 95, temperature 97.7 F (36.5 C), temperature source Oral, resp. rate 18, height 5' 3.5" (1.613 m), weight 82.9 kg (182 lb 12.2 oz).Body mass index is 31.86 kg/(m^2).  General Appearance: Casual  Eye Contact::  Good  Speech:  Clear and Coherent  Volume:  Decreased  Mood:  Anxious and Depressed  Affect:  Non-Congruent and Flat  Thought Process:  Coherent  Orientation:  Full (Time, Place, and Person)  Thought Content:  WDL  Suicidal Thoughts:  No  Homicidal Thoughts:  No  Memory:  Immediate;   Fair Recent;   Fair Remote;   Fair  Judgement:  Fair  Insight:  Fair  Psychomotor Activity:  Normal  Concentration:  Fair  Recall:  NA  Fund of Knowledge:Good  Language: Good  Akathisia:  No  Handed:  Right  AIMS (if indicated):     Assets:  Communication Skills Desire for Improvement Financial Resources/Insurance Housing Resilience Social Support Talents/Skills Vocational/Educational  Sleep:       Musculoskeletal: Strength & Muscle Tone: within normal limits Gait & Station: normal Patient leans: N/A  Past Medical History:   Past Medical History  Diagnosis Date  . Hypertension   . Peripheral neuropathy   . Facial tic   . Depression   . Diabetes mellitus without complication    Loss of Consciousness:  blackouts every time she drinks wine (3x weekly), for a few minutes Allergies:   Allergies  Allergen Reactions  . Iodine Other (See Comments)    Blisters  . Neomycin Other (See Comments)    Blisters   . Phenergan [Promethazine Hcl] Other (See Comments)    Altered Mental Status   PTA Medications: Prescriptions prior to admission  Medication Sig Dispense Refill  . acetaminophen (TYLENOL) 500 MG tablet Take 500 mg by  mouth every 6 (six) hours as needed.      . Armodafinil (NUVIGIL) 200 MG TABS Take 200 mg by mouth daily.       . Ascorbic Acid (VITAMIN C) 1000 MG tablet Take 1,000 mg by mouth daily.      . cloNIDine (CATAPRES) 0.1 MG tablet Take 0.1 mg by mouth 3 (three) times daily.      . DULoxetine (CYMBALTA) 30 MG capsule Take 30 mg by mouth 2 (two) times daily.      Marland Kitchen estradiol (VIVELLE-DOT) 0.025 MG/24HR Place 1 patch onto the skin 2 (two) times a week.      . ferrous sulfate 325 (65 FE) MG tablet Take 325 mg by mouth daily with breakfast.      . gabapentin (NEURONTIN) 400 MG capsule Take 400 mg by mouth 6 (six) times daily.      Marland Kitchen ibuprofen (ADVIL,MOTRIN) 200 MG tablet Take 600-800 mg by mouth every 6 (six) hours as needed for moderate pain.      Marland Kitchen lisinopril (PRINIVIL,ZESTRIL) 30 MG tablet Take 30 mg by mouth 2 (two) times daily.      . Multiple Vitamin (MULTIVITAMIN WITH MINERALS) TABS tablet Take 1 tablet by mouth daily.      Marland Kitchen  Multiple Vitamins-Minerals (ZINC PO) Take 1 tablet by mouth daily.      . naproxen sodium (ALEVE) 220 MG tablet Take 220-440 mg by mouth 2 (two) times daily as needed (Pain).      . pregabalin (LYRICA) 100 MG capsule Take 100 mg by mouth 6 (six) times daily.      Marland Kitchen PRESCRIPTION MEDICATION Apply 1 application topically 2 (two) times daily. Compounded Cream made to help nerve pain.        Previous Psychotropic Medications:  Medication/Dose  SEE MAR               Substance Abuse History in the last 12 months:  yes  Consequences of Substance Abuse: Medical Consequences:  Hospitalization Family Consequences:  Family dynamics Blackouts:    Social History:  reports that she has never smoked. She does not have any smokeless tobacco history on file. She reports that she drinks about 4.8 ounces of alcohol per week. She reports that she uses illicit drugs (Oxycodone). Additional Social History: Pain Medications: Oxycodone History of alcohol / drug use?: Yes Name of  Substance 1: Alcohol 1 - Age of First Use: 15 1 - Amount (size/oz): 3 glasses of wine                   Family History:   Family History  Problem Relation Age of Onset  . Bipolar disorder Sister   . Drug abuse Sister   . Schizophrenia Son   . Bipolar disorder Father   . Alcohol abuse Father   . Drug abuse Mother   . Alcohol abuse Mother     Results for orders placed during the hospital encounter of 07/24/13 (from the past 72 hour(s))  CBC WITH DIFFERENTIAL     Status: None   Collection Time    07/24/13  3:45 PM      Result Value Ref Range   WBC 8.4  4.0 - 10.5 K/uL   RBC 5.00  3.87 - 5.11 MIL/uL   Hemoglobin 14.4  12.0 - 15.0 g/dL   HCT 42.9  36.0 - 46.0 %   MCV 85.8  78.0 - 100.0 fL   MCH 28.8  26.0 - 34.0 pg   MCHC 33.6  30.0 - 36.0 g/dL   RDW 13.1  11.5 - 15.5 %   Platelets 260  150 - 400 K/uL   Neutrophils Relative % 69  43 - 77 %   Neutro Abs 5.7  1.7 - 7.7 K/uL   Lymphocytes Relative 25  12 - 46 %   Lymphs Abs 2.1  0.7 - 4.0 K/uL   Monocytes Relative 5  3 - 12 %   Monocytes Absolute 0.4  0.1 - 1.0 K/uL   Eosinophils Relative 1  0 - 5 %   Eosinophils Absolute 0.1  0.0 - 0.7 K/uL   Basophils Relative 0  0 - 1 %   Basophils Absolute 0.0  0.0 - 0.1 K/uL  COMPREHENSIVE METABOLIC PANEL     Status: Abnormal   Collection Time    07/24/13  3:45 PM      Result Value Ref Range   Sodium 149 (*) 137 - 147 mEq/L   Potassium 3.9  3.7 - 5.3 mEq/L   Chloride 108  96 - 112 mEq/L   CO2 25  19 - 32 mEq/L   Glucose, Bld 95  70 - 99 mg/dL   BUN 7  6 - 23 mg/dL   Creatinine, Ser 0.76  0.50 -  1.10 mg/dL   Calcium 8.7  8.4 - 10.5 mg/dL   Total Protein 7.2  6.0 - 8.3 g/dL   Albumin 3.6  3.5 - 5.2 g/dL   AST 24  0 - 37 U/L   ALT 23  0 - 35 U/L   Alkaline Phosphatase 124 (*) 39 - 117 U/L   Total Bilirubin <0.2 (*) 0.3 - 1.2 mg/dL   GFR calc non Af Amer >90  >90 mL/min   GFR calc Af Amer >90  >90 mL/min   Comment: (NOTE)     The eGFR has been calculated using the CKD  EPI equation.     This calculation has not been validated in all clinical situations.     eGFR's persistently <90 mL/min signify possible Chronic Kidney     Disease.  ETHANOL     Status: Abnormal   Collection Time    07/24/13  3:45 PM      Result Value Ref Range   Alcohol, Ethyl (B) 227 (*) 0 - 11 mg/dL   Comment:            LOWEST DETECTABLE LIMIT FOR     SERUM ALCOHOL IS 11 mg/dL     FOR MEDICAL PURPOSES ONLY  URINALYSIS, ROUTINE W REFLEX MICROSCOPIC     Status: Abnormal   Collection Time    07/24/13  5:22 PM      Result Value Ref Range   Color, Urine YELLOW  YELLOW   APPearance CLOUDY (*) CLEAR   Specific Gravity, Urine 1.025  1.005 - 1.030   pH 6.0  5.0 - 8.0   Glucose, UA NEGATIVE  NEGATIVE mg/dL   Hgb urine dipstick TRACE (*) NEGATIVE   Bilirubin Urine NEGATIVE  NEGATIVE   Ketones, ur NEGATIVE  NEGATIVE mg/dL   Protein, ur NEGATIVE  NEGATIVE mg/dL   Urobilinogen, UA 0.2  0.0 - 1.0 mg/dL   Nitrite NEGATIVE  NEGATIVE   Leukocytes, UA LARGE (*) NEGATIVE  URINE RAPID DRUG SCREEN (HOSP PERFORMED)     Status: Abnormal   Collection Time    07/24/13  5:22 PM      Result Value Ref Range   Opiates NONE DETECTED  NONE DETECTED   Cocaine POSITIVE (*) NONE DETECTED   Benzodiazepines POSITIVE (*) NONE DETECTED   Amphetamines NONE DETECTED  NONE DETECTED   Tetrahydrocannabinol NONE DETECTED  NONE DETECTED   Barbiturates NONE DETECTED  NONE DETECTED   Comment:            DRUG SCREEN FOR MEDICAL PURPOSES     ONLY.  IF CONFIRMATION IS NEEDED     FOR ANY PURPOSE, NOTIFY LAB     WITHIN 5 DAYS.                LOWEST DETECTABLE LIMITS     FOR URINE DRUG SCREEN     Drug Class       Cutoff (ng/mL)     Amphetamine      1000     Barbiturate      200     Benzodiazepine   888     Tricyclics       280     Opiates          300     Cocaine          300     THC              50  URINE MICROSCOPIC-ADD ON  Status: Abnormal   Collection Time    07/24/13  5:22 PM      Result Value Ref  Range   WBC, UA TOO NUMEROUS TO COUNT  <3 WBC/hpf   RBC / HPF 0-2  <3 RBC/hpf   Bacteria, UA MANY (*) RARE   Psychological Evaluations:  Assessment:   DSM5:  Substance/Addictive Disorders:  Alcohol Related Disorder - Severe (303.90) and Opioid Disorder - Severe (304.00) Depressive Disorders:  Major Depressive Disorder - Severe (296.23)  AXIS I:  Alcohol Abuse, Substance Abuse and Substance Induced Mood Disorder AXIS II:  Deferred AXIS III:   Past Medical History  Diagnosis Date  . Hypertension   . Peripheral neuropathy   . Facial tic   . Depression   . Diabetes mellitus without complication    AXIS IV:  other psychosocial or environmental problems and problems related to social environment AXIS V:  41-50 serious symptoms  Treatment Plan/Recommendations:   Review of chart, vital signs, medications, and notes.  1-Medication management for depression and anxiety: Medications reviewed with the patient and she stated no untoward effects.   -Discontinue Ibuprofen -Started Celebrex 175m bid (to replace ibuprofen) -Discontinue Armodafinil -Start Modafinil 1040mdaily (to replace Armodafinil; pharmacy does not stock this) -Changed Neurontin from 6x a day to tid per guidelines -Changed Lyrica from 6x a day to tid per guidelines    2-Coping skills for depression, anxiety  3-Continue crisis stabilization and management  4-Address health issues--monitoring vital signs, stable  5-Treatment plan in progress to prevent relapse of depression and anxiety  Treatment Plan Summary: Daily contact with patient to assess and evaluate symptoms and progress in treatment Medication management Current Medications:  Current Facility-Administered Medications  Medication Dose Route Frequency Provider Last Rate Last Dose  . celecoxib (CELEBREX) capsule 100 mg  100 mg Oral BID JoBenjamine MolaFNP      . chlordiazePOXIDE (LIBRIUM) capsule 25 mg  25 mg Oral Q6H PRN SpLaverle HobbyPA-C      .  chlordiazePOXIDE (LIBRIUM) capsule 25 mg  25 mg Oral QID SpLaverle HobbyPA-C   25 mg at 07/25/13 083005 Followed by  . [START ON 07/26/2013] chlordiazePOXIDE (LIBRIUM) capsule 25 mg  25 mg Oral TID SpLaverle HobbyPA-C       Followed by  . [START ON 07/27/2013] chlordiazePOXIDE (LIBRIUM) capsule 25 mg  25 mg Oral BH-qamhs Spencer E Simon, PA-C       Followed by  . [START ON 07/28/2013] chlordiazePOXIDE (LIBRIUM) capsule 25 mg  25 mg Oral Daily SpLaverle HobbyPA-C      . cloNIDine (CATAPRES) tablet 0.1 mg  0.1 mg Oral TID SpLaverle HobbyPA-C   0.1 mg at 07/25/13 0842  . DULoxetine (CYMBALTA) DR capsule 30 mg  30 mg Oral BID SpLaverle HobbyPA-C   30 mg at 07/25/13 0836  . [START ON 07/31/2013] estradiol (CLIMARA - Dosed in mg/24 hr) patch 0.05 mg  0.05 mg Transdermal Weekly SpLaverle HobbyPA-C      . ferrous sulfate tablet 325 mg  325 mg Oral Q breakfast SpLaverle HobbyPA-C   325 mg at 07/25/13 0834  . gabapentin (NEURONTIN) capsule 400 mg  400 mg Oral Once JoBenjamine MolaFNP      . gabapentin (NEURONTIN) capsule 800 mg  800 mg Oral TID JoBenjamine MolaFNP      . hydrOXYzine (ATARAX/VISTARIL) tablet 25 mg  25 mg Oral Q6H PRN  Laverle Hobby, PA-C   25 mg at 07/25/13 0409  . lisinopril (PRINIVIL,ZESTRIL) tablet 30 mg  30 mg Oral BID Laverle Hobby, PA-C   30 mg at 07/25/13 7014  . loperamide (IMODIUM) capsule 2 mg  2 mg Oral TID PRN Benjamine Mola, FNP      . modafinil (PROVIGIL) tablet 100 mg  100 mg Oral Daily Benjamine Mola, FNP      . ondansetron (ZOFRAN-ODT) disintegrating tablet 4 mg  4 mg Oral Q6H PRN Laverle Hobby, PA-C      . pregabalin (LYRICA) capsule 100 mg  100 mg Oral Once Benjamine Mola, FNP      . pregabalin (LYRICA) capsule 200 mg  200 mg Oral TID Benjamine Mola, FNP      . thiamine (VITAMIN B-1) tablet 100 mg  100 mg Oral Daily Laverle Hobby, PA-C   100 mg at 07/25/13 0841  . traZODone (DESYREL) tablet 50 mg  50 mg Oral QHS,MR X 1 Laverle Hobby, PA-C          Elyse Jarvis [place], FNP-BC 5/27/20159:19 AM
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
## 2013-07-25 NOTE — Progress Notes (Addendum)
Chaplain worked with pt to fill out Water quality scientist of Attorney, Living Will, and Mental Health Advance Directive).  There were not potential witnesses present on units when paperwork was completed.  Spoke with nursing about finding two witnesses that are not affiliated with hospital or pt's family in order for paperwork to be notarized.  Possibly find family members of other patients to serve as witness during dinner hour.    Once paperwork is notarized, one copy goes to medical records to be scanned into chart, one goes in physical paper chart.  Patient receives original and two or three copies to give to family and other healthcare providers.   Patient can also have paperwork notarized at bank or other facility.  She will need to return notarized paperwork to [facility] to be scanned into her electronic chart.   Clover Mealy MDiv

## 2013-07-25 NOTE — Progress Notes (Signed)
Patient ID: Bethany Barton, female   DOB: Aug 31, 1970, 43 y.o.   MRN: [id number]  Pt spoke with Dr. Lucianne Muss about a statements regarding a baby in PICU having a dose of morphine and dying within the past few days. Pt reports that she gave the dose as prescribed by "Dr. Domenic Moras in the PICU" and that the baby was dying and estimated to die in PICU before going to the hospital floor. Pt reports that she gave the morphine dose as ordered by the MD and that a nurse, "Corrie Dandy" was with her at the time and can confirm this. Pt reports that she dose as given at approximately "12 or 1pm and the baby died at 3 or 4pm". Pt reports that this was very difficult emotionally seeing the small casket in the child's room. This information was obtained by this NP as well as Dr. Lucianne Muss (medical director of [facility]) in the OBS Unit.  Constance Haw, FNP-BC   On 07/25/2013   At 09:50AM

## 2013-07-25 NOTE — [facility] Assessment (Signed)
[facility] Assessment Progress Note  After consulting with Claudette Head, NP it has been determined that pt does not present a life threatening danger to herself or others, and that psychiatric hospitalization is not indicated for her at this time.  Pt would benefit from admission to a residential substance abuse rehabilitation facility.  She has been accepted to [facility] in [place], [place], per [place].  Pt will arrange to have family transport her to the facility tonight.  Address and phone number have been included in her discharge instructions.  Doylene Canning, MA Triage Specialist 07/25/2013 @ 18:01

## 2013-07-25 NOTE — Progress Notes (Signed)
Patient ID: Bethany Barton, female   DOB: 1970/11/14, 43 y.o.   MRN: [id number] Woke her at 830 am for am medications,breakfast and to call her boyfriend who has had called 10 min earlier.She is pleasant on awakening.Only complaint is a headache.this am but too early for Ibuprofen. Conrad in this am and made several adjustments to her medications.She has complaints of neuropathy pain in both of her feet of a 8.5 and the best it ever is a 5.She has a CIWA of 0 this am and her vitals are within normal limits.She is pleasant and verbal. It is her want to go to rehab from here but unsure where she can go. She is not eligible for FMLA until Sept and has young children at home feels she is unsure if she can leave. After discussion agrees she is not a very effective parents drinking and taking pain pills and is willing to attend a long term program but not to go to [facility] as her sister wants. She spoke with her boyfriend on the phone and she says he is supportive but doesn't want her to go to [facility] either.She denies any thoughts to hurt self or others. Not psychotic Dr.Kumar in to speak with her and recommended a leave of absence from work and a long term rehab.
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
## 2013-07-26 NOTE — H&P (Signed)
Patient reports she is overwhelmed, wants to be in a long-term inpatient substance abuse program so that she can be drug free. She reports multiple stressors which include her family, her job and adds that she needs help. She denies any suicidal ideation, homicidal ideation, hallucinations.

## 2013-07-26 NOTE — Progress Notes (Signed)
Patient was interviewed, reports that she struggles with addiction, adds that statement she made about the baby was not that she killed a child but that she gave the child morphine as prescribed by the M.D.

## 2013-09-21 ENCOUNTER — Emergency Department (HOSPITAL_COMMUNITY)
Admission: EM | Admit: 2013-09-21 | Discharge: 2013-09-21 | Disposition: A | Discharge status: Home / Self Care | Payer: 59 | Attending: Emergency Medicine | Admitting: Emergency Medicine

## 2013-09-21 ENCOUNTER — Encounter (HOSPITAL_COMMUNITY): Payer: Self-pay | Admitting: Emergency Medicine

## 2013-09-21 DIAGNOSIS — I10 Essential (primary) hypertension: Secondary | ICD-10-CM | POA: Insufficient documentation

## 2013-09-21 DIAGNOSIS — L02811 Cutaneous abscess of head [any part, except face]: Secondary | ICD-10-CM

## 2013-09-21 DIAGNOSIS — L03811 Cellulitis of head [any part, except face]: Secondary | ICD-10-CM

## 2013-09-21 DIAGNOSIS — L03818 Cellulitis of other sites: Principal | ICD-10-CM

## 2013-09-21 DIAGNOSIS — E119 Type 2 diabetes mellitus without complications: Secondary | ICD-10-CM | POA: Insufficient documentation

## 2013-09-21 DIAGNOSIS — L02818 Cutaneous abscess of other sites: Secondary | ICD-10-CM | POA: Insufficient documentation

## 2013-09-21 DIAGNOSIS — F329 Major depressive disorder, single episode, unspecified: Secondary | ICD-10-CM | POA: Insufficient documentation

## 2013-09-21 DIAGNOSIS — Z79899 Other long term (current) drug therapy: Secondary | ICD-10-CM | POA: Insufficient documentation

## 2013-09-21 DIAGNOSIS — F3289 Other specified depressive episodes: Secondary | ICD-10-CM | POA: Insufficient documentation

## 2013-09-21 DIAGNOSIS — G608 Other hereditary and idiopathic neuropathies: Secondary | ICD-10-CM | POA: Insufficient documentation

## 2013-09-21 DIAGNOSIS — Z791 Long term (current) use of non-steroidal anti-inflammatories (NSAID): Secondary | ICD-10-CM | POA: Insufficient documentation

## 2013-09-21 HISTORY — DX: Other psychoactive substance abuse, uncomplicated: F19.10

## 2013-09-21 MED ORDER — CEPHALEXIN 500 MG PO CAPS: 500.0000 mg | ORAL_CAPSULE | Freq: Four times a day (QID) | ORAL | Status: DC

## 2013-09-21 MED ORDER — SULFAMETHOXAZOLE-TRIMETHOPRIM 800-160 MG PO TABS: 1.0000 | ORAL_TABLET | Freq: Two times a day (BID) | ORAL | Status: DC

## 2013-09-21 MED ORDER — NAPROXEN 250 MG PO TABS: 250.0000 mg | ORAL_TABLET | Freq: Two times a day (BID) | ORAL | Status: DC | PRN

## 2013-09-21 NOTE — ED Notes (Signed)
Pt c/o abscess to back of head x 4 days.

## 2013-09-21 NOTE — Discharge Instructions (Signed)
°Emergency Department Resource Guide °1) Find a Doctor and Pay Out of Pocket °Although you won't have to find out who is covered by your insurance plan, it is a good idea to ask around and get recommendations. You will then need to call the office and see if the doctor you have chosen will accept you as a new patient and what types of options they offer for patients who are self-pay. Some doctors offer discounts or will set up payment plans for their patients who do not have insurance, but you will need to ask so you aren't surprised when you get to your appointment. ° °2) Contact Your Local Health Department °Not all health departments have doctors that can see patients for sick visits, but many do, so it is worth a call to see if yours does. If you don't know where your local health department is, you can check in your phone book. The CDC also has a tool to help you locate your state's health department, and many state websites also have listings of all of their local health departments. ° °3) Find a Walk-in Clinic °If your illness is not likely to be very severe or complicated, you may want to try a walk in clinic. These are popping up all over the country in pharmacies, drugstores, and shopping centers. They're usually staffed by nurse practitioners or physician assistants that have been trained to treat common illnesses and complaints. They're usually fairly quick and inexpensive. However, if you have serious medical issues or chronic medical problems, these are probably not your best option. ° °No Primary Care Doctor: °- Call [facility] at  832-8000 - they can help you locate a primary care doctor that  accepts your insurance, provides certain services, etc. °- Physician Referral Service- 1-800-533-3463 ° °Chronic Pain Problems: °Organization         Address  Phone   Notes  °[facility]  (336) 297-2271 Patients need to be referred by their primary care doctor.  ° °Medication  Assistance: °Organization         Address  Phone   Notes  °[place] [facility] [address] °[place], [place] [address] (336) 641-8030 --Must be a resident of [place] °-- Must have NO insurance coverage whatsoever (no Medicaid/ Medicare, etc.) °-- The pt. MUST have a primary care doctor that directs their care regularly and follows them in the community °  °[facility]  (866) 331-1348   °[employer]  (888) 892-1162   ° °Agencies that provide inexpensive medical care: °Organization         Address  Phone   Notes  °[facility]  (336) 832-8035   °[facility]    (336) 832-7272   °[facility] [address] °[place], [place] [address] (336) 832-4777   °[facility] [address], °[place] (336) 271-4999   °[facility]    (336) 373-0678   °[facility]    (336) 272-1050   °[facility] ° [address], [place] Phone:  (336) 832-4444, Fax:  (336) 832-4440 Hours of Operation:  9 am - 6 pm, M-F.  Also accepts Medicaid/Medicare and self-pay.  °[facility] ° [address], [place] Phone: (336) 832-3150, Fax: (336) [phone]. Hours of Operation:  8:30 am - 5:30 pm, M-F.  Also accepts Medicaid and self-pay.  °[facility] [place] [address], [place] Phone: (336) 878-6027   °[facility] [address], [place], [place] (336)723-1848, Ext. 123 Mondays & Thursdays: 7-9 AM.  First 15 patients are seen on a first come, first serve basis. °  ° °Medicaid-accepting [place] Providers: ° °Organization         Address  Phone   Notes  °[facility] [address], [place] (336) 641-2100 Also accepts self-pay patients.  °[facility] [address], [place] ° (336) 856-9996   °[facility] [address], [place]  (336) 288-8857   °[facility] [address] [place] [address], [place] (336) 299-7000   °Veita Bland [address], [place]  ° (336) 373-1557 Only accepts [place] Access Medicaid patients after they have their name applied to their card.  ° °Self-Pay (no insurance) in [place]: ° °Organization         Address  Phone   Notes  °Sickle Cell Patients, [facility] [address], [place] (336) 832-1970   °[facility] [address], [place] (336) 832-4400   °[facility] ° [address], [place] (336) 992-4800   °[facility]/Dr. Osei-Bonsu ° [address] [place] [address], [place] or [address], [place] (336) 841-8500 Phone number for both [place] and [place] locations is the same.  °[facility] [address], [place] (336) 299-0000   °[facility] [address] [place] [address], [place] or [address] (336) 852-7530 °(336) 878-2260   °[facility] [address], [place] (336) 350-1642, phone; (336) 294-5005, fax Sees patients 1st and 3rd Saturday of every month.  Must not qualify for public or private insurance (i.e. Medicaid, Medicare, [place] Health Choice, Veterans' Benefits) • Household income should be no more than 200% of the poverty level •The clinic cannot treat you if you are pregnant or think you are pregnant • Sexually transmitted diseases are not treated at the clinic.  ° ° °Dental Care: °Organization         Address  Phone  Notes  °[place] [facility] [address], [place] (336) 641-6152 Accepts children up to age 21 who are enrolled in Medicaid or [place] Health Choice; pregnant women with a Medicaid card; and children who have applied for Medicaid or [place] Health Choice, but were declined, whose parents can pay a reduced fee at time of service.  °[place]  [facility] [place]  [address], [place] (336) 641-7733 Accepts children up to age 21 who are enrolled in Medicaid or [place] Health Choice; pregnant women with a Medicaid card; and children who have applied for Medicaid or [place] Health Choice, but were declined, whose parents can pay a reduced fee at time of service.  °[facility] ° [address], [place] (336) 641-4533 Patients are seen by appointment only. Walk-ins are not accepted. [facility] will see patients 18 years of age and older. °Monday - Tuesday (8am-5pm) °Most Wednesdays (8:30-5pm) °$30 per visit, cash only  °[facility] ° [address], [place] (336) 641-4533 Patients are seen by appointment only. Walk-ins are not accepted. [facility] will see patients 18 years of age and older. °One   Wednesday Evening (Monthly: Volunteer Based).  $30 per visit, cash only  °[facility]  (919) 537-3737 for adults; Children under age 4, call Graduate Pediatric Dentistry at ([phone]) [phone]. Children aged 4-14, please call (919) [phone] to request a pediatric application. ° Dental services are provided in all areas of dental care including fillings, crowns and bridges, complete and partial dentures, implants, gum treatment, root canals, and extractions. Preventive care is also provided. Treatment is provided to both adults and children. °Patients are selected via a lottery and there is often a waiting list. °  °[facility] [address], °[place] ° (336) 763-8833 [email or web address] °  °[facility] [address], [place], [place] (336)723-1848, Ext. 123 Second and Fourth Thursday of each month, opens at 6:30 AM; Clinic ends at 9 AM.  Patients are seen on a first-come first-served basis, and a limited number are seen during each clinic.  ° °[facility] ° [address], [place], [place] (336) 723-7904    Eligibility Requirements °You must have lived in [place], [place], or [place] counties for at least the last three months. °  You cannot be eligible for state or federal sponsored healthcare insurance, including Veterans Administration, Medicaid, or Medicare. °  You generally cannot be eligible for healthcare insurance through your employer.  °  How to apply: °Eligibility screenings are held every Tuesday and Wednesday afternoon from 1:00 pm until 4:00 pm. You do not need an appointment for the interview!  °[facility] [address], [place], [place] 336-631-2330   °[facility]  336-342-8273   °[facility]  336-703-3100   °[facility]  336-570-6415   ° °Behavioral Health Resources in the Community: °Intensive Outpatient Programs °Organization         Address  Phone  Notes  °[facility] [address], [place], [place] 336-878-6098   °[facility] [address], [place], [place] 336-832-9800   °[facility] [address], [place], [place] ° 336-882-2125   °[facility] [address],  °[place], [place] 1-800-853-5163 or 336-641-4981   °Substance Abuse Resources °Organization         Address  Phone  Notes  °[facility]  336-882-2125   °[facility]  336-784-9470   °[facility]  336-285-9073   °[facility]  336-845-3988   °Residential & Outpatient Substance Abuse Program  1-800-659-3381   °Psychological Services °Organization         Address  Phone  Notes  °[facility]  336- 832-9600   °[facility]  336- 378-7881   °[facility] [address], [place] 1-800-853-5163 or 336-641-4981   ° °Mobile Crisis Teams °Organization         Address  Phone  Notes  °[facility], Mobile Crisis Care Unit  1-877-626-1772   °Assertive °[facility] ° [address]  [place], [place] 336-834-9664   °Sharon DeEsch [address] °[place] 336-554-5454   ° °Self-Help/Support Groups °Organization         Address  Phone             Notes  °[facility]  - variety of support groups  336- 373-1402 Call for more information  °Narcotics Anonymous (NA), [facility] [address], °[place]  2 meetings at this location  ° °  Residential Treatment Programs Organization         Address  Phone  Notes  [facility] [address],    [place]  [phone]   [facility]  [address], [place] [phone], [place], [place] [phone]   [facility] [address], [place] [phone] Admissions: 8am-3pm M-F  [facility] [address]    [place], [place] [phone](612) [phone]   [facility] [address], [place], [place] [phone]   [facility] [address]  [place], [place] [phone]   [facility] Intensive Outpatient [address], [place], [place] [phone]   [facility] ([facility] [address]  [place], [place] [phone] or (367)[phone](763)[phone]   [facility] ([facility] [address] [place], [place] [phone] Accepts Medicaid  [facility] [address]  [place] [phone] Substance Abuse/Addiction Treatment   [facility]         Address  Phone  Notes  [facility]  [phone](888) [phone]   Angie FavaJulie Brannon, PhD [address], [address] [place], [place]   [phone](336) (920)[phone] or [phone](336) [phone]   [facility]   [address] [place], [place] [phone](336) [phone]   [facility] [address], [place], [place] ([phone]) [phone](336) [phone] Insurance/Medicaid/sponsorship through [facility]  [facility] [address]                                    [place], [place] [phone](336) [phone] Therapy/tele-psych/case    [facility] [address]   [place], [place] [phone]) [phone]    Dr. Lolly MustacheArfeen  [phone]) [phone]   [facility] [place]  [employer] [facility] 1) [address], [place] 2) [address], [place] 3)  [address], [place] [phone](336) [phone](336) [phone]  [phone](336) [phone]   [facility] (475)840-8486(336) 986-598-9123 or 580-857-4542(336) 212-388-3216 (After Hours)       Take the prescriptions as directed.  Apply moist heat to the area(s) of discomfort, for 15 minutes at a time, several times per day for the next few days, to encourage the areas to continue to drain.  Do not fall asleep on a heating pack.  Call your regular medical doctor today to schedule a follow up appointment for a re-check in the next 2 days.  Return to the Emergency Department immediately if worsening.

## 2013-09-21 NOTE — ED Provider Notes (Signed)
CSN: [id number]     Arrival date & time 09/21/13  1444 History   First MD Initiated Contact with Patient 09/21/13 1536     Chief Complaint  Patient presents with  . Abscess     HPI Pt was seen at 1555. Per pt, c/o gradual onset and persistence of constant "abscesses" to posterior scalp for the past 4 to 5 days. Pt states she "felt a bump" in 2 areas and "squeezed them." States both areas have been spontaneously draining purulent fluid. Pt states "they're just red" and "I think I have cellulitis," so she came to the ED for further evaluation. Denies fevers, no other areas of rash/abscess.   Past Medical History  Diagnosis Date  . Hypertension   . Peripheral neuropathy   . Facial tic   . Depression   . Diabetes mellitus without complication   . Polysubstance abuse     etoh, opiates   Past Surgical History  Procedure Laterality Date  . 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 Abdominal hysterectomy  2013  . Ankle fusion Right 1991  . Appendectomy  1987  . Cesarean section  1997  . Abdominoplasty  2013   Family History  Problem Relation Age of Onset  . Bipolar disorder Sister   . Drug abuse Sister   . Schizophrenia Son   . Bipolar disorder Father   . Alcohol abuse Father   . Drug abuse Mother   . Alcohol abuse Mother    History  Substance Use Topics  . Smoking status: Never Smoker   . Smokeless tobacco: Not on file  . Alcohol Use: 4.8 oz/week    8 Glasses of wine per week     Comment: history of cocaine and narcotic dependence    Review of Systems ROS: Statement: All systems negative except as marked or noted in the HPI; Constitutional: Negative for fever and chills. ; ; Eyes: Negative for eye pain, redness and discharge. ; ; ENMT: Negative for ear pain, hoarseness, nasal congestion, sinus pressure and sore throat. ; ; Cardiovascular: Negative for chest pain, palpitations, diaphoresis, dyspnea and peripheral edema. ; ; Respiratory: Negative for cough,  wheezing and stridor. ; ; Gastrointestinal: Negative for nausea, vomiting, diarrhea, abdominal pain, blood in stool, hematemesis, jaundice and rectal bleeding. . ; ; Genitourinary: Negative for dysuria, flank pain and hematuria. ; ; Musculoskeletal: Negative for back pain and neck pain. Negative for swelling and trauma.; ; Skin: Negative for pruritus, abrasions, blisters, bruising and +skin lesion.; ; Neuro: Negative for headache, lightheadedness and neck stiffness. Negative for weakness, altered level of consciousness , altered mental status, extremity weakness, paresthesias, involuntary movement, seizure and syncope.       Allergies  Iodine; Neomycin; and Phenergan  Home Medications   Prior to Admission medications   Medication Sig Start Date End Date Taking? Authorizing Provider  Armodafinil (NUVIGIL) 200 MG TABS Take 200 mg by mouth daily as needed (for pain).   Yes Historical Provider, MD  aspirin-acetaminophen-caffeine (EXCEDRIN MIGRAINE) [phone] MG per tablet Take 2 tablets by mouth every 6 (six) hours as needed for headache or migraine.   Yes Historical Provider, MD  Buprenorphine HCl-Naloxone HCl (SUBOXONE) 8-2 MG FILM Place 1-1.5 Film under the tongue 2 (two) times daily. Takes one and one-half film in the morning and one film at bedtime   Yes Historical Provider, MD  DULoxetine (CYMBALTA) 60 MG capsule Take 60 mg by mouth daily.   Yes Historical Provider,  MD  gabapentin (NEURONTIN) 400 MG capsule Take 1 capsule (400 mg total) by mouth 6 (six) times daily. 07/25/13  Yes Beau FannyJohn C Withrow, FNP  lisinopril (PRINIVIL,ZESTRIL) 30 MG tablet Take 1 tablet (30 mg total) by mouth 2 (two) times daily. 07/25/13  Yes Beau FannyJohn C Withrow, FNP  Multiple Vitamin (MULTIVITAMIN WITH MINERALS) TABS tablet Take 1 tablet by mouth daily.   Yes Historical Provider, MD  rizatriptan (MAXALT) 10 MG tablet Take 10 mg by mouth as needed for migraine. May repeat in 2 hours if needed   Yes Historical Provider, MD   cephALEXin (KEFLEX) 500 MG capsule Take 1 capsule (500 mg total) by mouth 4 (four) times daily. 09/21/13   Laray AngerKathleen M Ezekiah Massie, DO  naproxen (NAPROSYN) 250 MG tablet Take 1 tablet (250 mg total) by mouth 2 (two) times daily as needed for mild pain or moderate pain (Take with food.). 09/21/13   Laray AngerKathleen M Ali Mclaurin, DO  sulfamethoxazole-trimethoprim (BACTRIM DS) 800-160 MG per tablet Take 1 tablet by mouth 2 (two) times daily. 09/21/13   Laray AngerKathleen M Zackaria Burkey, DO   BP 169/97  Pulse 110  Temp(Src) 99.1 F (37.3 C)  Resp 18  Ht 5\' 3"  (1.6 m)  Wt 170 lb (77.111 kg)  BMI 30.12 kg/m2  SpO2 99% Physical Exam 1600: Physical examination:  Nursing notes reviewed; Vital signs and O2 SAT reviewed;  Constitutional: Well developed, Well nourished, Well hydrated, In no acute distress; Head:  Normocephalic, atraumatic. +2 open areas on occipital scalp with purulent drainage and surrounding erythema/induration. No fluctuance. No soft tissue crepitus. No streaking.; Eyes: EOMI, PERRL, No scleral icterus; ENMT: Mouth and pharynx normal, Mucous membranes moist; Neck: Supple, Full range of motion, No lymphadenopathy; Cardiovascular: Regular rate and rhythm, No murmur, rub, or gallop; Respiratory: Breath sounds clear & equal bilaterally, No rales, rhonchi, wheezes.  Speaking full sentences with ease, Normal respiratory effort/excursion; Chest: Nontender, Movement normal; Abdomen: Soft, Nontender, Nondistended, Normal bowel sounds; Genitourinary: No CVA tenderness; Extremities: Pulses normal, No tenderness, No edema, No calf edema or asymmetry.; Neuro: AA&Ox3, Major CN grossly intact.  Speech clear. No gross focal motor or sensory deficits in extremities. Climbs on and off stretcher easily by herself. Gait steady.; Skin: Color normal, Warm, Dry.   ED Course  Procedures     MDM  MDM Reviewed: previous chart, nursing note and vitals     1615:  2 abscesses to occipital scalp spontaneously draining. No further  fluctuance palpated at either area. Both have surrounding erythema and induration; will start abx. Dx d/w pt.  Questions answered.  Verb understanding, agreeable to d/c home with outpt f/u.   Laray AngerKathleen M Merel Santoli, DO 09/24/13 1945

## 2013-09-24 ENCOUNTER — Encounter (HOSPITAL_COMMUNITY): Payer: Self-pay | Admitting: Internal Medicine

## 2013-09-24 ENCOUNTER — Inpatient Hospital Stay (HOSPITAL_COMMUNITY)
Admission: AD | Admit: 2013-09-24 | Discharge: 2013-09-27 | DRG: 603 | Disposition: A | Discharge status: Home Health | Payer: 59 | Source: Ambulatory Visit | Attending: Internal Medicine | Admitting: Internal Medicine

## 2013-09-24 DIAGNOSIS — E1140 Type 2 diabetes mellitus with diabetic neuropathy, unspecified: Secondary | ICD-10-CM | POA: Diagnosis present

## 2013-09-24 DIAGNOSIS — L03818 Cellulitis of other sites: Principal | ICD-10-CM

## 2013-09-24 DIAGNOSIS — L02818 Cutaneous abscess of other sites: Principal | ICD-10-CM | POA: Diagnosis present

## 2013-09-24 DIAGNOSIS — I10 Essential (primary) hypertension: Secondary | ICD-10-CM | POA: Diagnosis present

## 2013-09-24 DIAGNOSIS — Z9884 Bariatric surgery status: Secondary | ICD-10-CM

## 2013-09-24 DIAGNOSIS — E860 Dehydration: Secondary | ICD-10-CM | POA: Diagnosis present

## 2013-09-24 DIAGNOSIS — E1165 Type 2 diabetes mellitus with hyperglycemia: Secondary | ICD-10-CM

## 2013-09-24 DIAGNOSIS — F3289 Other specified depressive episodes: Secondary | ICD-10-CM | POA: Diagnosis present

## 2013-09-24 DIAGNOSIS — E1149 Type 2 diabetes mellitus with other diabetic neurological complication: Secondary | ICD-10-CM | POA: Diagnosis present

## 2013-09-24 DIAGNOSIS — F1120 Opioid dependence, uncomplicated: Secondary | ICD-10-CM | POA: Diagnosis present

## 2013-09-24 DIAGNOSIS — Z882 Allergy status to sulfonamides status: Secondary | ICD-10-CM

## 2013-09-24 DIAGNOSIS — R112 Nausea with vomiting, unspecified: Secondary | ICD-10-CM | POA: Diagnosis present

## 2013-09-24 DIAGNOSIS — F1121 Opioid dependence, in remission: Secondary | ICD-10-CM | POA: Diagnosis present

## 2013-09-24 DIAGNOSIS — IMO0002 Reserved for concepts with insufficient information to code with codable children: Secondary | ICD-10-CM

## 2013-09-24 DIAGNOSIS — F1014 Alcohol abuse with alcohol-induced mood disorder: Secondary | ICD-10-CM

## 2013-09-24 DIAGNOSIS — F411 Generalized anxiety disorder: Secondary | ICD-10-CM | POA: Diagnosis present

## 2013-09-24 DIAGNOSIS — E1142 Type 2 diabetes mellitus with diabetic polyneuropathy: Secondary | ICD-10-CM | POA: Diagnosis present

## 2013-09-24 DIAGNOSIS — F329 Major depressive disorder, single episode, unspecified: Secondary | ICD-10-CM | POA: Diagnosis present

## 2013-09-24 DIAGNOSIS — Z888 Allergy status to other drugs, medicaments and biological substances status: Secondary | ICD-10-CM

## 2013-09-24 DIAGNOSIS — L02811 Cutaneous abscess of head [any part, except face]: Secondary | ICD-10-CM | POA: Diagnosis present

## 2013-09-24 LAB — COMPREHENSIVE METABOLIC PANEL
ALK PHOS: 118 U/L — AB (ref 39–117)
ALT: 9 U/L (ref 0–35)
AST: 15 U/L (ref 0–37)
Albumin: 3.7 g/dL (ref 3.5–5.2)
Anion gap: 17 — ABNORMAL HIGH (ref 5–15)
BUN: 15 mg/dL (ref 6–23)
CO2: 24 mEq/L (ref 19–32)
Calcium: 9.6 mg/dL (ref 8.4–10.5)
Chloride: 95 mEq/L — ABNORMAL LOW (ref 96–112)
Creatinine, Ser: 0.51 mg/dL (ref 0.50–1.10)
GFR calc Af Amer: >90 mL/min (ref >90)
GFR calc non Af Amer: >90 mL/min (ref >90)
Glucose, Bld: 85 mg/dL (ref 70–99)
POTASSIUM: 3.6 meq/L — AB (ref 3.7–5.3)
SODIUM: 136 meq/L — AB (ref 137–147)
Total Bilirubin: 0.3 mg/dL (ref 0.3–1.2)
Total Protein: 8 g/dL (ref 6.0–8.3)

## 2013-09-24 LAB — CBC WITH DIFFERENTIAL/PLATELET
BASOS PCT: 1 % (ref 0–1)
Basophils Absolute: 0.1 10*3/uL (ref 0.0–0.1)
Eosinophils Absolute: 0.3 10*3/uL (ref 0.0–0.7)
Eosinophils Relative: 3 % (ref 0–5)
HCT: 41.9 % (ref 36.0–46.0)
Hemoglobin: 14.6 g/dL (ref 12.0–15.0)
Lymphocytes Relative: 20 % (ref 12–46)
Lymphs Abs: 2.3 10*3/uL (ref 0.7–4.0)
MCH: 29.4 pg (ref 26.0–34.0)
MCHC: 34.8 g/dL (ref 30.0–36.0)
MCV: 84.3 fL (ref 78.0–100.0)
MONOS PCT: 9 % (ref 3–12)
Monocytes Absolute: 1 10*3/uL (ref 0.1–1.0)
NEUTROS PCT: 67 % (ref 43–77)
Neutro Abs: 7.9 10*3/uL — ABNORMAL HIGH (ref 1.7–7.7)
PLATELETS: 385 10*3/uL (ref 150–400)
RBC: 4.97 MIL/uL (ref 3.87–5.11)
RDW: 12.7 % (ref 11.5–15.5)
WBC: 11.6 10*3/uL — ABNORMAL HIGH (ref 4.0–10.5)

## 2013-09-24 LAB — PROTIME-INR
INR: 0.94 (ref 0.00–1.49)
PROTHROMBIN TIME: 12.6 s (ref 11.6–15.2)

## 2013-09-24 LAB — GLUCOSE, CAPILLARY: Glucose-Capillary: 75 mg/dL (ref 70–99)

## 2013-09-24 MED ORDER — VANCOMYCIN HCL IN DEXTROSE 1-5 GM/200ML-% IV SOLN
1000.0000 mg | Freq: Two times a day (BID) | INTRAVENOUS | Status: DC
  Filled 2013-09-24 (×2): qty 200

## 2013-09-24 MED ORDER — ALBUTEROL SULFATE (2.5 MG/3ML) 0.083% IN NEBU: 2.5000 mg | INHALATION_SOLUTION | RESPIRATORY_TRACT | Status: DC | PRN

## 2013-09-24 MED ORDER — ACETAMINOPHEN 650 MG RE SUPP: 650.0000 mg | Freq: Four times a day (QID) | RECTAL | Status: DC | PRN

## 2013-09-24 MED ORDER — CHLORHEXIDINE GLUCONATE 4 % EX LIQD
Freq: Once | CUTANEOUS | Status: DC
  Filled 2013-09-24: qty 15

## 2013-09-24 MED ORDER — LISINOPRIL 20 MG PO TABS
30.0000 mg | ORAL_TABLET | Freq: Two times a day (BID) | ORAL | Status: DC
  Administered 2013-09-24 – 2013-09-27 (×5): 30 mg via ORAL
  Filled 2013-09-24 (×8): qty 1

## 2013-09-24 MED ORDER — LISINOPRIL 20 MG PO TABS: 30.0000 mg | ORAL_TABLET | Freq: Two times a day (BID) | ORAL | Status: DC

## 2013-09-24 MED ORDER — PIPERACILLIN-TAZOBACTAM 3.375 G IVPB
3.3750 g | Freq: Three times a day (TID) | INTRAVENOUS | Status: DC
  Administered 2013-09-24 – 2013-09-26 (×5): 3.375 g via INTRAVENOUS
  Filled 2013-09-24 (×8): qty 50

## 2013-09-24 MED ORDER — VANCOMYCIN HCL IN DEXTROSE 750-5 MG/150ML-% IV SOLN
750.0000 mg | Freq: Three times a day (TID) | INTRAVENOUS | Status: DC
  Administered 2013-09-24 – 2013-09-27 (×7): 750 mg via INTRAVENOUS
  Filled 2013-09-24 (×11): qty 150

## 2013-09-24 MED ORDER — ONDANSETRON HCL 4 MG/2ML IJ SOLN: 4.0000 mg | Freq: Four times a day (QID) | INTRAMUSCULAR | Status: DC | PRN

## 2013-09-24 MED ORDER — GABAPENTIN 400 MG PO CAPS
400.0000 mg | ORAL_CAPSULE | Freq: Every day | ORAL | Status: DC
  Administered 2013-09-24 – 2013-09-27 (×13): 400 mg via ORAL
  Filled 2013-09-24 (×22): qty 1

## 2013-09-24 MED ORDER — SODIUM CHLORIDE 0.9 % IV SOLN: INTRAVENOUS | Status: AC

## 2013-09-24 MED ORDER — ENOXAPARIN SODIUM 40 MG/0.4ML ~~LOC~~ SOLN
40.0000 mg | SUBCUTANEOUS | Status: DC
  Administered 2013-09-24: 40 mg via SUBCUTANEOUS
  Filled 2013-09-24 (×2): qty 0.4

## 2013-09-24 MED ORDER — DULOXETINE HCL 60 MG PO CPEP
60.0000 mg | ORAL_CAPSULE | Freq: Every day | ORAL | Status: DC
  Administered 2013-09-24 – 2013-09-27 (×3): 60 mg via ORAL
  Filled 2013-09-24 (×4): qty 1

## 2013-09-24 MED ORDER — ONDANSETRON HCL 4 MG PO TABS: 4.0000 mg | ORAL_TABLET | Freq: Four times a day (QID) | ORAL | Status: DC | PRN

## 2013-09-24 MED ORDER — HYDROMORPHONE HCL PF 1 MG/ML IJ SOLN
0.5000 mg | INTRAMUSCULAR | Status: DC | PRN
  Administered 2013-09-24 – 2013-09-26 (×6): 0.5 mg via INTRAVENOUS
  Administered 2013-09-27: 1 mg via INTRAVENOUS
  Administered 2013-09-27 (×3): 0.5 mg via INTRAVENOUS
  Filled 2013-09-24 (×10): qty 1

## 2013-09-24 MED ORDER — ACETAMINOPHEN 325 MG PO TABS
650.0000 mg | ORAL_TABLET | Freq: Four times a day (QID) | ORAL | Status: DC | PRN
  Administered 2013-09-24 – 2013-09-27 (×7): 650 mg via ORAL
  Filled 2013-09-24 (×7): qty 2

## 2013-09-24 MED ORDER — TRAMADOL HCL 50 MG PO TABS
50.0000 mg | ORAL_TABLET | Freq: Four times a day (QID) | ORAL | Status: DC | PRN
  Administered 2013-09-24 – 2013-09-27 (×9): 50 mg via ORAL
  Filled 2013-09-24 (×9): qty 1

## 2013-09-24 MED ORDER — PANTOPRAZOLE SODIUM 40 MG IV SOLR
40.0000 mg | INTRAVENOUS | Status: DC
  Administered 2013-09-24 – 2013-09-25 (×2): 40 mg via INTRAVENOUS
  Filled 2013-09-24 (×3): qty 40

## 2013-09-24 MED ORDER — INSULIN ASPART 100 UNIT/ML ~~LOC~~ SOLN
0.0000 [IU] | Freq: Three times a day (TID) | SUBCUTANEOUS | Status: DC
  Administered 2013-09-25: 1 [IU] via SUBCUTANEOUS

## 2013-09-24 NOTE — H&P (Signed)
History and Physical  Bethany Barton DOB: 01/20/1971 DOA: 09/24/2013  Referring physician: Dr. Lilly CoveNimish Gosrani PCP: Wilson SingerGOSRANI,NIMISH C, MD  Outpatient Specialists:  1. Unknown.  Chief Complaint: Painful neck willing draining pus.  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 She gives a one-week history of noticing some matted hair which was sore at the back of her neck. The next day she noticed a painful bump which she continued to topical treatment with warm compresses and cleaning with soap and water. This however progressively gotten worse, ruptured and started draining pus. She was seen in the emergency department on 09/21/13 and was found to have 2 spontaneously draining abscesses in the occipital scalp. No further fluctuance was palpated but there was erythema and induration. She was discharged home on oral Keflex and Bactrim. She states that these have not really helped the abscess. However she has had intractable nausea and non-bloody vomiting following Bactrim. She has had low-grade fever of 100.37F. No chills. Throbbing intermittent pain at the swelling site which is nonradiating, made worse to neck movement and touching. Continues to drain greenish yellow pus. She denies abdominal pain or diarrhea. Unable to eat much secondary to vomiting. She contacted her PCP and was advised to go to [facility] for [facility] but due to some misunderstanding, [facility]. Labs have been requested and are yet to be drawn.  Review of Systems: All systems reviewed and apart from history of presenting illness, are negative.  Past Medical History  Diagnosis Date  . Hypertension   . Peripheral neuropathy   . Facial tic   . Depression   .  Diabetes mellitus without complication   . Polysubstance abuse     etoh, opiates   Past Surgical History  Procedure Laterality Date  . 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 Abdominal hysterectomy  2013  . Ankle fusion Right 1991  . Appendectomy  1987  . Cesarean section  1997  . Abdominoplasty  2013  . Cholecystectomy     Social History:  reports that she has never smoked. She does not have any smokeless tobacco history on file. She reports that she drinks about 4.8 ounces of alcohol per week. She reports that she uses illicit drugs (Oxycodone). Independent of activities of daily living. She states that she does not smoke, drink alcohol or do drugs-has not done so for 3 months.  Allergies  Allergen Reactions  . Iodine Other (See Comments)    Blisters  . Neomycin Other (See Comments)    Blisters   . Phenergan [Promethazine Hcl] Other (See Comments)    Altered Mental Status  . Sulfa Antibiotics Nausea And Vomiting    Family History  Problem Relation Age of Onset  . Bipolar disorder Sister   . Drug abuse Sister   . Schizophrenia Son   . Bipolar disorder Father   . Alcohol abuse Father   . Drug abuse Mother   . Alcohol abuse Mother     Prior to Admission medications   Medication Sig Start Date End Date Taking? Authorizing Provider  acetaminophen (TYLENOL) 325 MG tablet Take 325 mg by mouth every 6 (six) hours as needed (pain).   Yes Historical Provider, MD  Armodafinil (NUVIGIL) 200 MG  TABS Take 200 mg by mouth daily as needed (for pain).   Yes Historical Provider, MD  aspirin-acetaminophen-caffeine (EXCEDRIN MIGRAINE) [id number] MG per tablet Take 2 tablets by mouth every 6 (six) hours as needed for headache or migraine.   Yes Historical Provider, MD  cephALEXin (KEFLEX) 500 MG capsule Take 1 capsule (500 mg total) by mouth 4 (four) times daily. 09/21/13  Yes Laray Anger, DO  DULoxetine (CYMBALTA) 60 MG capsule Take 60 mg by mouth daily.   Yes  Historical Provider, MD  gabapentin (NEURONTIN) 400 MG capsule Take 1 capsule (400 mg total) by mouth 6 (six) times daily. 07/25/13  Yes Beau Fanny, FNP  lisinopril (PRINIVIL,ZESTRIL) 30 MG tablet Take 1 tablet (30 mg total) by mouth 2 (two) times daily. 07/25/13  Yes Beau Fanny, FNP  Multiple Vitamin (MULTIVITAMIN WITH MINERALS) TABS tablet Take 1 tablet by mouth daily.   Yes Historical Provider, MD  rizatriptan (MAXALT) 10 MG tablet Take 10 mg by mouth as needed for migraine. May repeat in 2 hours if needed   Yes Historical Provider, MD  naproxen (NAPROSYN) 250 MG tablet Take 1 tablet (250 mg total) by mouth 2 (two) times daily as needed for mild pain or moderate pain (Take with food.). 09/21/13   Laray Anger, DO   Physical Exam: Filed Vitals:   09/24/13 1429  BP: 155/100  Pulse: 94  Temp: 99.2 F (37.3 C)  TempSrc: Oral  Resp: 18  SpO2: 100%     General exam: Moderately built and nourished pleasant young female patient, lying comfortably supine in bed in no obvious distress.  Head, eyes and ENT: Nontraumatic and normocephalic. Pupils equally reacting to light and accommodation. Oral mucosa slightly dry.  Neck: Supple. No JVD, carotid bruit or thyromegaly.  Lymphatics: Bilateral posterior neck lymph nodes palpable.  Respiratory system: Clear to auscultation. No increased work of breathing.  Cardiovascular system: S1 and S2 heard, RRR. No JVD, murmurs, gallops, clicks or pedal edema.  Gastrointestinal system: Abdomen is nondistended, soft and nontender. Normal bowel sounds heard. No organomegaly or masses appreciated.  Central nervous system: Alert and oriented. No focal neurological deficits.  Extremities: Symmetric 5 x 5 power. Peripheral pulses symmetrically felt.   Skin: No rashes or acute findings.  Musculoskeletal system: Negative exam.  Psychiatry: Pleasant and cooperative.  Scalp: Patient has approximately 10 cm x 6 cm horizontally oval area of  swelling over the lower mid to left scalp near the hairline with associated erythema, tenderness. This is draining purulent material which is not foul-smelling. There is matted hair on top making visibility poor. There is significant induration and some areas of fluctuance. No crepitus. Palpable posterior neck shotty couple of lymph nodes on either side.   Labs on Admission:  Basic Metabolic Panel: No results found for this basename: NA, K, CL, CO2, GLUCOSE, BUN, CREATININE, CALCIUM, MG, PHOS,  in the last 168 hours Liver Function Tests: No results found for this basename: AST, ALT, ALKPHOS, BILITOT, PROT, ALBUMIN,  in the last 168 hours No results found for this basename: LIPASE, AMYLASE,  in the last 168 hours No results found for this basename: AMMONIA,  in the last 168 hours CBC: No results found for this basename: WBC, NEUTROABS, HGB, HCT, MCV, PLT,  in the last 168 hours Cardiac Enzymes: No results found for this basename: CKTOTAL, CKMB, CKMBINDEX, TROPONINI,  in the last 168 hours  BNP (last 3 results) No results found for this basename: PROBNP,  in the  last 8760 hours CBG: No results found for this basename: GLUCAP,  in the last 168 hours  Radiological Exams on Admission: No results found.    Assessment/Plan Principal Problem:   Scalp abscess Active Problems:   Opioid dependence   Hypertension   DM type 2, uncontrolled, with neuropathy   Essential hypertension, benign   Nausea and vomiting   1. Occipital scalp abscess: Admitted to medical floor. Did not tolerate outpatient oral antibiotic treatment with Bactrim and Keflex. Send abscess drainage fluid for culture sensitivity. Blood culture when necessary. Treat empirically with IV vancomycin and Zosyn. She has history of multiple skin and soft tissue infections including Pseudomonas infection of her right ankle which has been fused. Surgeons have been consulted for possible incision and drainage. Pain management. 2. Nausea  and vomiting:? Secondary to Bactrim. DC Bactrim. Diet as tolerated. Brief IV fluids for mild dehydration. When necessary IV antiemetics and IV PPI. Monitor. 3. Uncontrolled hypertension: Continue home dose of lisinopril and monitor. 4. Type II DM with peripheral neuropathy. States that she was able to come medications after gastric bypass surgery. NovoLog SSI. 5. History of polysubstance abuse: Patient states that she has been clean for a couple of months. Minimize narcotics as much as possible.     Code Status: Full  Family Communication: None at the bedside.  Disposition Plan: Home in 2-3 days.   Time spent: 60 minutes.  Marcellus Scott, MD, FACP, FHM. [facility] Pager [phone]  If 7PM-7AM, please contact night-coverage [email or web address] Password [facility] 09/24/2013, 4:28 PM

## 2013-09-24 NOTE — Consult Note (Signed)
Abscess within the hairline, unusual.  Will take to the OR for drainage tomorrow.  Bethany LamasJames O. Gae BonWyatt, III, MD, FACS [phone]--pager [phone]--office [facility]

## 2013-09-24 NOTE — Progress Notes (Addendum)
Pt here from [facility] and Dr Karilyn CotaGosrani tried to page his office for orders no answer [facility] who referred me to the fllow manager who stated she didn't know who was picking pt up.She will page Dr Karilyn CotaGosrani when he returns [facility] at 3pm to see who he [facility].1530 Dr Arthor CaptainElmahi to follow pt and come up and write orders.[id number] Dr Waymon AmatoHongalgi in to see pt

## 2013-09-24 NOTE — Consult Note (Signed)
Reason for Consult: scalp abscess Referring Physician: Dr. Marcellus ScottAnand Hongalgi    HPI: Bethany RiasBrandy Barton old female with a history of anxiety/depression, peripheral neuropathy secondary to diabetes mellitus, gastric bypass [facility] with a occipital region abscess.  Onset was gradual.  Coarse is worsening.  Time pattern is constant.  Associated with fever of 100.4, chills, malaise and pain.  It has increased in size overtime.  [facility] and sent home with keflex and bactrim and discharged home.  She reports it spontaneously began draining on Friday.  drainage is copious, yellow.  Modifying factors; antibiotics as above and covering with a dry dressing.  No alleviating factors.  She followed up with her primary provider and was [facility], however, due to some confusion/dissatisfaction [facility] was [facility].  WBC on the  26th was normal.  Repeat labs and wound culture has been collected.  Vanc and zosyn have been started.  She is afebrile. VSS.    Past Medical History  Diagnosis Date  . Hypertension   . Peripheral neuropathy   . Facial tic   . Depression   . Diabetes mellitus without complication   . Polysubstance abuse     etoh, opiates    Past Surgical History  Procedure Laterality Date  . 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 Abdominal hysterectomy  2013  . Ankle fusion Right 1991  . Appendectomy  1987  . Cesarean section  1997  . Abdominoplasty  2013  . Cholecystectomy      Family History  Problem Relation Age of Onset  . Bipolar disorder Sister   . Drug abuse Sister   . Schizophrenia Son   . Bipolar disorder Father   . Alcohol abuse Father   . Drug abuse Mother   . Alcohol abuse Mother     Social History:  reports that she has never smoked. She does not have any smokeless tobacco history on file. She reports that she drinks about 4.8 ounces of alcohol per week. She reports that she uses illicit drugs  (Oxycodone).  Allergies:  Allergies  Allergen Reactions  . Iodine Other (See Comments)    Blisters  . Neomycin Other (See Comments)    Blisters   . Phenergan [Promethazine Hcl] Other (See Comments)    Altered Mental Status  . Sulfa Antibiotics Nausea And Vomiting    Medications:  Scheduled Meds: . DULoxetine  60 mg Oral Daily  . enoxaparin (LOVENOX) injection  40 mg Subcutaneous Q24H  . gabapentin  400 mg Oral 6 X Daily  . insulin aspart  0-9 Units Subcutaneous TID WC  . lisinopril  30 mg Oral BID  . pantoprazole (PROTONIX) IV  40 mg Intravenous Q24H  . piperacillin-tazobactam (ZOSYN)  IV  3.375 g Intravenous Q8H  . vancomycin  1,000 mg Intravenous Q12H   Continuous Infusions: . sodium chloride     PRN Meds:.acetaminophen, acetaminophen, albuterol, HYDROmorphone (DILAUDID) injection, ondansetron (ZOFRAN) IV, ondansetron, traMADol   No results found for this or any previous visit (from the past 48 hour(s)).  No results found.  Review of Systems  All other systems reviewed and are negative.  Blood pressure 155/100, pulse 94, temperature 99.2 F (37.3 C), temperature source Oral, resp. rate 18, SpO2 100.00%. Physical Exam  Constitutional: She is oriented to person, place, and time.  HENT:  Head: Normocephalic and atraumatic.    Eyes: Conjunctivae and EOM are normal.  Neck: Normal range of motion.  Cardiovascular: Normal rate, regular rhythm, normal heart sounds and intact distal pulses.  Exam reveals no gallop and no friction rub.   No murmur heard. Respiratory: Effort normal and breath sounds normal. No respiratory distress. She has no wheezes. She has no rales. She exhibits no tenderness.  GI: Soft. Bowel sounds are normal. She exhibits no distension and no mass. There is no tenderness. There is no rebound.  Musculoskeletal: Normal range of motion. She exhibits no edema and no tenderness.  Neurological: She is alert and oriented to person, place, and time.  Skin:  Skin is warm and dry.  Psychiatric: She has a normal mood and affect. Her behavior is normal. Judgment and thought content normal.    Assessment/Plan: Scalp abscess: agree with antibiotics.  Will follow cultures.  Will make her NPO after midnight.  If no improvement, we will proceed with I&D under anesthesia.  Recommend repeat CBC in AM.  Thank you for the consult.  Will continue to follow.   [name]  ANP-BC 09/24/2013, 4:34 PM

## 2013-09-24 NOTE — Progress Notes (Addendum)
ANTIBIOTIC CONSULT NOTE - INITIAL  Pharmacy Consult for vancomycin and zosyn Indication: Occipital scalp abscess  Allergies  Allergen Reactions  . Iodine Other (See Comments)    Blisters  . Neomycin Other (See Comments)    Blisters   . Phenergan [Promethazine Hcl] Other (See Comments)    Altered Mental Status  . Sulfa Antibiotics Nausea And Vomiting    Patient Measurements:   Adjusted Body Weight:   Vital Signs: Temp: 99.2 F (37.3 C) (07/27 1429) Temp src: Oral (07/27 1429) BP: 155/100 mmHg (07/27 1429) Pulse Rate: 94 (07/27 1429) Intake/Output from previous day:   Intake/Output from this shift:    Labs: No results found for this basename: WBC, HGB, PLT, LABCREA, CREATININE,  in the last 72 hours The CrCl is unknown because both a height and weight (above a minimum accepted value) are required for this calculation. No results found for this basename: VANCOTROUGH, VANCOPEAK, VANCORANDOM, GENTTROUGH, GENTPEAK, GENTRANDOM, TOBRATROUGH, TOBRAPEAK, TOBRARND, AMIKACINPEAK, AMIKACINTROU, AMIKACIN,  in the last 72 hours   Microbiology: No results found for this or any previous visit (from the past 720 hour(s)).  Medical History: Past Medical History  Diagnosis Date  . Hypertension   . Peripheral neuropathy   . Facial tic   . Depression   . Diabetes mellitus without complication   . Polysubstance abuse     etoh, opiates    Medications:  Scheduled:  . DULoxetine  60 mg Oral Daily  . enoxaparin (LOVENOX) injection  40 mg Subcutaneous Q24H  . gabapentin  400 mg Oral 6 X Daily  . insulin aspart  0-9 Units Subcutaneous TID WC  . lisinopril  30 mg Oral BID  . pantoprazole (PROTONIX) IV  40 mg Intravenous Q24H   Infusions:  . sodium chloride     Assessment: 43 yo female with occipital scalp abscess will be started on vancomycin and zosyn. Historic SCr around 0.76 (CrCl ~80). Wt 77.1 kg  Goal of Therapy:  Vancomycin trough level 15-20 mcg/ml  Plan:  1) Vancomycin  750mg  iv q8h and zosyn 3.375g iv q8h (4hr infusion) 2) Follow up on SCr  3) Monitor cx and sensitivity when available 4) Check vancomycin trough when it's appropriate.  Margaretta Chittum, Tsz-Yin 09/24/2013,4:21 PM

## 2013-09-24 NOTE — Progress Notes (Signed)
Dear Doctor: This patient has been identified as a candidate for PICC for the following reason (s): drug pH or osmolality (causing phlebitis, infiltration in 24 hours) If you agree, please write an order for the indicated device. For any questions contact the Vascular Access Team at [phone] if no answer, please leave a message.  Thank you for supporting the early vascular access assessment program. Umaiza Matusik M  

## 2013-09-25 ENCOUNTER — Encounter (HOSPITAL_COMMUNITY): Payer: Self-pay | Admitting: Anesthesiology

## 2013-09-25 ENCOUNTER — Inpatient Hospital Stay (HOSPITAL_COMMUNITY): Payer: 59 | Admitting: Certified Registered Nurse Anesthetist

## 2013-09-25 ENCOUNTER — Encounter (HOSPITAL_COMMUNITY)
Admission: AD | Disposition: A | Discharge status: Home Health | Payer: Self-pay | Source: Ambulatory Visit | Attending: Internal Medicine

## 2013-09-25 ENCOUNTER — Encounter (HOSPITAL_COMMUNITY): Payer: 59 | Admitting: Certified Registered Nurse Anesthetist

## 2013-09-25 DIAGNOSIS — F10988 Alcohol use, unspecified with other alcohol-induced disorder: Secondary | ICD-10-CM

## 2013-09-25 DIAGNOSIS — E1142 Type 2 diabetes mellitus with diabetic polyneuropathy: Secondary | ICD-10-CM

## 2013-09-25 DIAGNOSIS — E1149 Type 2 diabetes mellitus with other diabetic neurological complication: Secondary | ICD-10-CM

## 2013-09-25 DIAGNOSIS — L02818 Cutaneous abscess of other sites: Principal | ICD-10-CM

## 2013-09-25 DIAGNOSIS — L03818 Cellulitis of other sites: Principal | ICD-10-CM

## 2013-09-25 DIAGNOSIS — I10 Essential (primary) hypertension: Secondary | ICD-10-CM

## 2013-09-25 HISTORY — PX: INCISION AND DRAINAGE ABSCESS: SHX5864

## 2013-09-25 LAB — URINALYSIS, ROUTINE W REFLEX MICROSCOPIC
Glucose, UA: NEGATIVE mg/dL
Hgb urine dipstick: NEGATIVE
Ketones, ur: 15 mg/dL — AB
Nitrite: NEGATIVE
PH: 6 (ref 5.0–8.0)
Protein, ur: NEGATIVE mg/dL
Specific Gravity, Urine: 1.027 (ref 1.005–1.030)
Urobilinogen, UA: 1 mg/dL (ref 0.0–1.0)

## 2013-09-25 LAB — URINE MICROSCOPIC-ADD ON

## 2013-09-25 LAB — GLUCOSE, CAPILLARY
GLUCOSE-CAPILLARY: 74 mg/dL (ref 70–99)
GLUCOSE-CAPILLARY: 65 mg/dL — AB (ref 70–99)
Glucose-Capillary: 78 mg/dL (ref 70–99)
Glucose-Capillary: 79 mg/dL (ref 70–99)
Glucose-Capillary: 87 mg/dL (ref 70–99)
Glucose-Capillary: 139 mg/dL — ABNORMAL HIGH (ref 70–99)
Glucose-Capillary: 110 mg/dL — ABNORMAL HIGH (ref 70–99)

## 2013-09-25 LAB — SURGICAL PCR SCREEN
MRSA, PCR: POSITIVE — AB
STAPHYLOCOCCUS AUREUS: POSITIVE — AB

## 2013-09-25 SURGERY — INCISION AND DRAINAGE, ABSCESS
Anesthesia: General | Site: Neck

## 2013-09-25 MED ORDER — LACTATED RINGERS IV SOLN
INTRAVENOUS | Status: DC | PRN
  Administered 2013-09-25: 11:00:00 via INTRAVENOUS

## 2013-09-25 MED ORDER — LIDOCAINE HCL (CARDIAC) 20 MG/ML IV SOLN
INTRAVENOUS | Status: DC | PRN
  Administered 2013-09-25: 50 mg via INTRAVENOUS

## 2013-09-25 MED ORDER — FENTANYL CITRATE 0.05 MG/ML IJ SOLN
INTRAMUSCULAR | Status: DC | PRN
  Administered 2013-09-25: 150 ug via INTRAVENOUS
  Administered 2013-09-25: 100 ug via INTRAVENOUS

## 2013-09-25 MED ORDER — SUCCINYLCHOLINE CHLORIDE 20 MG/ML IJ SOLN
INTRAMUSCULAR | Status: DC | PRN
  Administered 2013-09-25: 100 mg via INTRAVENOUS

## 2013-09-25 MED ORDER — DEXTROSE 50 % IV SOLN
25.0000 g | Freq: Once | INTRAVENOUS | Status: DC
  Filled 2013-09-25: qty 50

## 2013-09-25 MED ORDER — FENTANYL CITRATE 0.05 MG/ML IJ SOLN
INTRAMUSCULAR | Status: AC
  Filled 2013-09-25: qty 5

## 2013-09-25 MED ORDER — PROPOFOL 10 MG/ML IV BOLUS
INTRAVENOUS | Status: DC | PRN
  Administered 2013-09-25: 250 mg via INTRAVENOUS

## 2013-09-25 MED ORDER — LIDOCAINE HCL (CARDIAC) 20 MG/ML IV SOLN
INTRAVENOUS | Status: AC
  Filled 2013-09-25: qty 5

## 2013-09-25 MED ORDER — PROPOFOL 10 MG/ML IV BOLUS
INTRAVENOUS | Status: AC
  Filled 2013-09-25: qty 20

## 2013-09-25 MED ORDER — SCOPOLAMINE 1 MG/3DAYS TD PT72
1.0000 | MEDICATED_PATCH | TRANSDERMAL | Status: DC
  Administered 2013-09-25: 1.5 mg via TRANSDERMAL
  Filled 2013-09-25: qty 1

## 2013-09-25 MED ORDER — MUPIROCIN 2 % EX OINT
TOPICAL_OINTMENT | Freq: Two times a day (BID) | CUTANEOUS | Status: DC
  Administered 2013-09-25: 1 via NASAL
  Administered 2013-09-25: 05:00:00 via NASAL
  Filled 2013-09-25: qty 22

## 2013-09-25 MED ORDER — ROCURONIUM BROMIDE 50 MG/5ML IV SOLN
INTRAVENOUS | Status: AC
  Filled 2013-09-25: qty 1

## 2013-09-25 MED ORDER — HYDROMORPHONE HCL PF 1 MG/ML IJ SOLN
0.2500 mg | INTRAMUSCULAR | Status: DC | PRN
  Administered 2013-09-25 (×2): 0.5 mg via INTRAVENOUS

## 2013-09-25 MED ORDER — HYDROMORPHONE HCL PF 1 MG/ML IJ SOLN
INTRAMUSCULAR | Status: AC
  Filled 2013-09-25: qty 1

## 2013-09-25 MED ORDER — LACTATED RINGERS IV SOLN
INTRAVENOUS | Status: DC
  Administered 2013-09-25 – 2013-09-27 (×3): via INTRAVENOUS

## 2013-09-25 MED ORDER — SCOPOLAMINE 1 MG/3DAYS TD PT72
MEDICATED_PATCH | TRANSDERMAL | Status: AC
  Administered 2013-09-25: 1.5 mg via TRANSDERMAL
  Filled 2013-09-25: qty 1

## 2013-09-25 MED ORDER — ENOXAPARIN SODIUM 40 MG/0.4ML ~~LOC~~ SOLN
40.0000 mg | SUBCUTANEOUS | Status: DC
  Administered 2013-09-26 – 2013-09-27 (×2): 40 mg via SUBCUTANEOUS
  Filled 2013-09-25 (×2): qty 0.4

## 2013-09-25 MED ORDER — PHENYLEPHRINE HCL 10 MG/ML IJ SOLN
INTRAMUSCULAR | Status: DC | PRN
  Administered 2013-09-25 (×2): 80 ug via INTRAVENOUS

## 2013-09-25 MED ORDER — DEXTROSE 50 % IV SOLN
INTRAVENOUS | Status: AC
  Filled 2013-09-25: qty 50

## 2013-09-25 MED ORDER — ONDANSETRON HCL 4 MG/2ML IJ SOLN: 4.0000 mg | Freq: Once | INTRAMUSCULAR | Status: DC | PRN

## 2013-09-25 MED ORDER — ONDANSETRON HCL 4 MG/2ML IJ SOLN
INTRAMUSCULAR | Status: DC | PRN
  Administered 2013-09-25 (×2): 4 mg via INTRAVENOUS

## 2013-09-25 MED ORDER — 0.9 % SODIUM CHLORIDE (POUR BTL) OPTIME
TOPICAL | Status: DC | PRN
  Administered 2013-09-25: 1000 mL

## 2013-09-25 MED ORDER — HYDROMORPHONE HCL PF 1 MG/ML IJ SOLN
INTRAMUSCULAR | Status: DC | PRN
  Administered 2013-09-25: 1 mg via INTRAVENOUS

## 2013-09-25 MED ORDER — DEXAMETHASONE SODIUM PHOSPHATE 4 MG/ML IJ SOLN
INTRAMUSCULAR | Status: AC
  Filled 2013-09-25: qty 1

## 2013-09-25 SURGICAL SUPPLY — 23 items
CANISTER SUCTION 2500CC (MISCELLANEOUS) ×3 IMPLANT
COVER SURGICAL LIGHT HANDLE (MISCELLANEOUS) ×3 IMPLANT
DRAPE LAPAROSCOPIC ABDOMINAL (DRAPES) ×3 IMPLANT
DRAPE UTILITY 15X26 W/TAPE STR (DRAPE) ×6 IMPLANT
DRSG PAD ABDOMINAL 8X10 ST (GAUZE/BANDAGES/DRESSINGS) ×3 IMPLANT
ELECT CAUTERY BLADE 6.4 (BLADE) ×3 IMPLANT
ELECT REM PT RETURN 9FT ADLT (ELECTROSURGICAL) ×3
ELECTRODE REM PT RTRN 9FT ADLT (ELECTROSURGICAL) ×1 IMPLANT
GAUZE PACKING FOLDED 1/2 STR (GAUZE/BANDAGES/DRESSINGS) ×3 IMPLANT
GLOVE BIOGEL PI IND STRL 8 (GLOVE) ×1 IMPLANT
GLOVE BIOGEL PI INDICATOR 8 (GLOVE) ×2
GLOVE ECLIPSE 7.5 STRL STRAW (GLOVE) ×3 IMPLANT
GOWN STRL REUS W/ TWL LRG LVL3 (GOWN DISPOSABLE) ×2 IMPLANT
GOWN STRL REUS W/TWL LRG LVL3 (GOWN DISPOSABLE) ×4
KIT BASIN OR (CUSTOM PROCEDURE TRAY) ×3 IMPLANT
KIT ROOM TURNOVER OR (KITS) ×3 IMPLANT
NS IRRIG 1000ML POUR BTL (IV SOLUTION) ×3 IMPLANT
PACK GENERAL/GYN (CUSTOM PROCEDURE TRAY) ×3 IMPLANT
PAD ARMBOARD 7.5X6 YLW CONV (MISCELLANEOUS) ×9 IMPLANT
SPONGE GAUZE 4X4 12PLY (GAUZE/BANDAGES/DRESSINGS) ×3 IMPLANT
SPONGE GAUZE 4X4 12PLY STER LF (GAUZE/BANDAGES/DRESSINGS) ×3 IMPLANT
TAPE CLOTH SURG 4X10 WHT LF (GAUZE/BANDAGES/DRESSINGS) ×3 IMPLANT
TOWEL OR 17X26 10 PK STRL BLUE (TOWEL DISPOSABLE) ×3 IMPLANT

## 2013-09-25 NOTE — Progress Notes (Signed)
For incision and drainage of abscess today.  Marta LamasJames O. Gae BonWyatt, III, MD, FACS [phone]--pager [phone]--office [facility]

## 2013-09-25 NOTE — Progress Notes (Signed)
[facility] PROGRESS NOTE  Bethany RiasBrandy Barton NWG:[id number] DOB: 10/07/1970 DOA: 09/24/2013 PCP: Bethany Barton,Bethany C, MD  Assessment/Plan: Occipital Scalp abscess  -Did not tolerate outpatient oral antibiotic treatment with Bactrim and Keflex -Status post I&D per Dr. Lindie SpruceWyatt>> followup on culture results -Continue empiric length and Zosyn pending cultures and sensitivities -Continue pain management Active Problems:  Uncontrolled hypertension: Continue home dose of lisinopril and monitor.  Type II DM with peripheral neuropathy. - States that she was able to come off medications after gastric bypass surgery.  -Continue NovoLog SSI.  History of polysubstance abuse: - Patient states that she has been clean for a couple of months.  -Judicious use of narcotics.    Code Status: Full Family Communication: None at bedside Disposition Plan:    Consultants:   general surgery  Procedures:   status post I&D of occipital abscess-per surgery  Antibiotics:   Vanc and Zosyn started on 7/27  HPI/Subjective:  status post I&D, states pain controlled. Denies nausea vomiting  Objective: Filed Vitals:   09/25/13 1325  BP: 131/99  Pulse: 76  Temp: 97.5 F (36.4 Barton)  Resp: 16    Intake/Output Summary (Last 24 hours) at 09/25/13 1803 Last data filed at 09/25/13 [id number]  Gross per 24 hour  Intake    960 ml  Output    850 ml  Net    110 ml   There were no vitals filed for this visit.  Exam:  General: alert & oriented x3 In NAD Head: Occipital area which are seen clean and dry Cardiovascular: RRR, nl S1 s2 Respiratory: CTAB Abdomen: soft +BS NT/ND, no masses palpable Extremities: No cyanosis and no edema    Data Reviewed: Basic Metabolic Panel:  Recent Labs Lab 09/24/13 1635  NA 136*  K 3.6*  CL 95*  CO2 24  GLUCOSE 85  BUN 15  CREATININE 0.51  CALCIUM 9.6   Liver Function Tests:  Recent Labs Lab 09/24/13 1635  AST 15  ALT 9  ALKPHOS 118*  BILITOT 0.3  PROT 8.0   ALBUMIN 3.7   No results found for this basename: LIPASE, AMYLASE,  in the last 168 hours No results found for this basename: AMMONIA,  in the last 168 hours CBC:  Recent Labs Lab 09/24/13 1635  WBC 11.6*  NEUTROABS 7.9*  HGB 14.6  HCT 41.9  MCV 84.3  PLT 385   Cardiac Enzymes: No results found for this basename: CKTOTAL, CKMB, CKMBINDEX, TROPONINI,  in the last 168 hours BNP (last 3 results) No results found for this basename: PROBNP,  in the last 8760 hours CBG:  Recent Labs Lab 09/25/13 0935 09/25/13 1040 09/25/13 1213 09/25/13 1308 09/25/13 1633  GLUCAP 74 79 65* 87 139*    Recent Results (from the past 240 hour(s))  CULTURE, ROUTINE-ABSCESS     Status: None   Collection Time    09/24/13  4:28 PM      Result Value Ref Range Status   Specimen Description ABSCESS DRAINAGE   Final   Special Requests FROM BACK OF HEAD   Final   Gram Stain     Final   Value: MODERATE WBC PRESENT,BOTH PMN AND MONONUCLEAR     NO SQUAMOUS EPITHELIAL CELLS SEEN     FEW GRAM POSITIVE COCCI IN PAIRS     IN CLUSTERS     Performed at [facility]   Culture     Final   Value: Culture reincubated for better growth     Performed at [facility]   Report Status PENDING   Incomplete  SURGICAL PCR SCREEN     Status: Abnormal   Collection Time    09/24/13 10:59 PM      Result Value Ref Range Status   MRSA, PCR POSITIVE (*) NEGATIVE Final   Comment: CRITICAL RESULT CALLED TO, READ BACK BY AND VERIFIED WITH:     W Caryl Never 09/25/13 0042 RHOLMES   Staphylococcus aureus POSITIVE (*) NEGATIVE Final   Comment:            The Xpert SA Assay (FDA     approved for NASAL specimens     in patients over 39 years of age),     is one component of     a comprehensive surveillance     program.  Test performance has     been validated by [employer] for patients greater     than or equal to 42 year old.     It is not intended     to diagnose infection nor to     guide or  monitor treatment.     CRITICAL RESULT CALLED TO, READ BACK BY AND VERIFIED WITH:     W GREGSON,RN 09/25/13 0042 RHOLMES     Studies: No results found.  Scheduled Meds: . chlorhexidine   Topical Once  . dextrose  25 g Intravenous Once  . dextrose      . DULoxetine  60 mg Oral Daily  . [START ON 09/26/2013] enoxaparin (LOVENOX) injection  40 mg Subcutaneous Q24H  . gabapentin  400 mg Oral 6 X Daily  . HYDROmorphone      . insulin aspart  0-9 Units Subcutaneous TID WC  . lisinopril  30 mg Oral BID  . mupirocin ointment   Nasal BID  . pantoprazole (PROTONIX) IV  40 mg Intravenous Q24H  . piperacillin-tazobactam (ZOSYN)  IV  3.375 g Intravenous Q8H  . scopolamine  1 patch Transdermal Q72H  . vancomycin  750 mg Intravenous Q8H   Continuous Infusions: . lactated ringers      Principal Problem:   Scalp abscess Active Problems:   Opioid dependence   Hypertension   DM type 2, uncontrolled, with neuropathy   Essential hypertension, benign   Nausea and vomiting    Time spent: 25    [facility]  [facility] Pager [phone]. If 7PM-7AM, please contact night-coverage at [email or web address], password [facility] 09/25/2013, 6:03 PM  LOS: 1 day

## 2013-09-25 NOTE — Transfer of Care (Deleted)
Immediate Anesthesia Transfer of Care Note  Patient: Bethany Barton  Procedure(s) Performed: Procedure(s): INCISION AND DRAINAGE ABSCESS (N/A)  Patient Location: PACU  Anesthesia Type:General  Level of Consciousness: awake, alert  and oriented  Airway & Oxygen Therapy: Patient Spontanous Breathing and Patient connected to face mask oxygen  Post-op Assessment: Report given to PACU RN, Post -op Vital signs reviewed and stable and Patient moving all extremities  Post vital signs: Reviewed and stable  Complications: No apparent anesthesia complications

## 2013-09-25 NOTE — Transfer of Care (Signed)
Immediate Anesthesia Transfer of Care Note  Patient: Bethany RiasBrandy Ballin  Procedure(s) Performed: Procedure(s): INCISION AND DRAINAGE ABSCESS (N/A)  Patient Location: PACU  Anesthesia Type:General  Level of Consciousness: awake, alert  and oriented  Airway & Oxygen Therapy: Patient Spontanous Breathing and Patient connected to nasal cannula oxygen  Post-op Assessment: Report given to PACU RN, Post -op Vital signs reviewed and stable and Patient moving all extremities  Post vital signs: Reviewed and stable  Complications: No apparent anesthesia complications

## 2013-09-25 NOTE — Anesthesia Procedure Notes (Signed)
Procedure Name: Intubation Date/Time: 09/25/2013 11:15 AM Performed by: Orvilla FusATO, Yonatan Guitron A Pre-anesthesia Checklist: Patient identified, Suction available, Emergency Drugs available, Patient being monitored and Timeout performed Patient Re-evaluated:Patient Re-evaluated prior to inductionOxygen Delivery Method: Circle system utilized Preoxygenation: Pre-oxygenation with 100% oxygen Intubation Type: IV induction Ventilation: Mask ventilation without difficulty Laryngoscope Size: Mac and 3 Grade View: Grade I Tube type: Oral Tube size: 7.0 mm Number of attempts: 1 Airway Equipment and Method: Stylet Placement Confirmation: ETT inserted through vocal cords under direct vision,  positive ETCO2 and breath sounds checked- equal and bilateral Secured at: 21 cm Tube secured with: Tape Dental Injury: Teeth and Oropharynx as per pre-operative assessment

## 2013-09-25 NOTE — Anesthesia Postprocedure Evaluation (Signed)
  Anesthesia Post-op Note  Patient: Bethany Barton  Procedure(s) Performed: Procedure(s): INCISION AND DRAINAGE ABSCESS (N/A)  Patient Location: PACU  Anesthesia Type:General  Level of Consciousness: awake, alert , oriented and patient cooperative  Airway and Oxygen Therapy: Patient Spontanous Breathing  Post-op Pain: mild  Post-op Assessment: Post-op Vital signs reviewed, Patient's Cardiovascular Status Stable, Respiratory Function Stable, Patent Airway, No signs of Nausea or vomiting and Adequate PO intake  Post-op Vital Signs: stable  Last Vitals:  Filed Vitals:   09/25/13 1207  BP: 142/90  Pulse: 80  Temp: 36.6 C  Resp: 16    Complications: No apparent anesthesia complications

## 2013-09-25 NOTE — Progress Notes (Signed)
Utilization review completed.  

## 2013-09-25 NOTE — Anesthesia Preprocedure Evaluation (Signed)
Anesthesia Evaluation  Patient identified by MRN, date of birth, ID band Patient awake    Reviewed: Allergy & Precautions, H&P , NPO status , Patient's Chart, lab work & pertinent test results  Airway       Dental   Pulmonary          Cardiovascular hypertension,     Neuro/Psych Depression  Neuromuscular disease    GI/Hepatic (+)     substance abuse  alcohol use,   Endo/Other  diabetes  Renal/GU      Musculoskeletal   Abdominal   Peds  Hematology   Anesthesia Other Findings   Reproductive/Obstetrics                           Anesthesia Physical Anesthesia Plan  ASA: III  Anesthesia Plan: General   Post-op Pain Management:    Induction: Intravenous  Airway Management Planned: Oral ETT  Additional Equipment:   Intra-op Plan:   Post-operative Plan: Extubation in OR  Informed Consent: I have reviewed the patients History and Physical, chart, labs and discussed the procedure including the risks, benefits and alternatives for the proposed anesthesia with the patient or authorized representative who has indicated his/her understanding and acceptance.     Plan Discussed with: CRNA, Anesthesiologist and Surgeon  Anesthesia Plan Comments:         Anesthesia Quick Evaluation

## 2013-09-25 NOTE — Op Note (Addendum)
OPERATIVE REPORT  DATE OF OPERATION:  09/25/2013  PATIENT:  Bethany RiasBrandy Barton  43 y.o. female  PRE-OPERATIVE DIAGNOSIS:  Posterior scalp abscess  POST-OPERATIVE DIAGNOSIS:  Posterior scalp abscess  PROCEDURE:  Procedure(s): INCISION AND DRAINAGE ABSCESS  SURGEON:  Surgeon(s): Cherylynn RidgesJames O Kaelah Hayashi, MD  ASSISTANT: None  ANESTHESIA:   general  EBL: <30 ml  BLOOD ADMINISTERED: none  DRAINS: Packe with 1/2 inch Nugauze without iodine   SPECIMEN:  No Specimen  COUNTS CORRECT:  YES  PROCEDURE DETAILS: The patient was taken to the operating room and placed on the table in supine position. After an adequate general endotracheal abscess was administered she was placed in the prone position and prepped and draped in usual sterile manner.  A proper time out was performed identifying the patient the procedure be performed. Bird little to open up this large abscess cavity which measures approximately about 4 cm in size. A seven centimeter transverse incision was made and necrotic tissue was debrided bluntly.. Subsequently packed with 3 separate packs of 1/4 inch perineal Nu Gauze. This was plain packing without iodine.. To start dressing was also reapplied. Occult were correct.  PATIENT DISPOSITION:  PACU - hemodynamically stable.   Cherylynn RidgesWYATT, [name] 7/28/201511:41 AM

## 2013-09-25 NOTE — Progress Notes (Signed)
Order given for 1/4 amp D50W..I have only given her 1/2 amp.Marland Kitchen.Marland Kitchen.I have been unable to correct [facility]. I have informed Ilona SorrelSarah Catoe, CRNA and Dr. Diamantina MonksGreg Smith of this.  DA

## 2013-09-26 ENCOUNTER — Encounter (HOSPITAL_COMMUNITY): Payer: Self-pay | Admitting: General Surgery

## 2013-09-26 LAB — BASIC METABOLIC PANEL
Anion gap: 13 (ref 5–15)
BUN: 10 mg/dL (ref 6–23)
CHLORIDE: 100 meq/L (ref 96–112)
CO2: 28 mEq/L (ref 19–32)
Calcium: 9 mg/dL (ref 8.4–10.5)
Creatinine, Ser: 0.56 mg/dL (ref 0.50–1.10)
GFR calc Af Amer: >90 mL/min (ref >90)
GFR calc non Af Amer: >90 mL/min (ref >90)
Glucose, Bld: 85 mg/dL (ref 70–99)
Potassium: 3.7 mEq/L (ref 3.7–5.3)
Sodium: 141 mEq/L (ref 137–147)

## 2013-09-26 LAB — GLUCOSE, CAPILLARY
GLUCOSE-CAPILLARY: 88 mg/dL (ref 70–99)
GLUCOSE-CAPILLARY: 63 mg/dL — AB (ref 70–99)
GLUCOSE-CAPILLARY: 111 mg/dL — AB (ref 70–99)
Glucose-Capillary: 108 mg/dL — ABNORMAL HIGH (ref 70–99)
Glucose-Capillary: 98 mg/dL (ref 70–99)

## 2013-09-26 LAB — CBC
HEMATOCRIT: 36.8 % (ref 36.0–46.0)
Hemoglobin: 12.2 g/dL (ref 12.0–15.0)
MCH: 28.7 pg (ref 26.0–34.0)
MCHC: 33.2 g/dL (ref 30.0–36.0)
MCV: 86.6 fL (ref 78.0–100.0)
Platelets: 324 10*3/uL (ref 150–400)
RBC: 4.25 MIL/uL (ref 3.87–5.11)
RDW: 12.8 % (ref 11.5–15.5)
WBC: 5.6 10*3/uL (ref 4.0–10.5)

## 2013-09-26 MED ORDER — PANTOPRAZOLE SODIUM 40 MG PO TBEC
40.0000 mg | DELAYED_RELEASE_TABLET | Freq: Every day | ORAL | Status: DC
  Administered 2013-09-26: 40 mg via ORAL
  Filled 2013-09-26: qty 1

## 2013-09-26 MED ORDER — CHLORHEXIDINE GLUCONATE CLOTH 2 % EX PADS
6.0000 | MEDICATED_PAD | Freq: Every day | CUTANEOUS | Status: DC
  Administered 2013-09-26 – 2013-09-27 (×2): 6 via TOPICAL

## 2013-09-26 MED ORDER — MUPIROCIN 2 % EX OINT
1.0000 "application " | TOPICAL_OINTMENT | Freq: Two times a day (BID) | CUTANEOUS | Status: DC
  Administered 2013-09-26 – 2013-09-27 (×3): 1 via NASAL
  Filled 2013-09-26: qty 22

## 2013-09-26 NOTE — Progress Notes (Addendum)
[facility] Progress Note   Bethany Barton ZOX:[id number] DOB: 06-27-1970 DOA: 09/24/2013 PCP: Wilson Singer, MD   Brief narrative: Bethany Barton is a 43 y.o. female  presents with and occipital scalp abscess who was placed on Keflex and Bactrim in by the ER on 7/24. She did not tolerate orals due to vomiting and presented to [facility] on 7/27 per PCP recommendations for IV treatment. Underwent I and D on 7/28 by general surgery.  PMH: oxycodone abuse (recently), HTN, gastric bypass  Subjective: Mild pain in neck, unable to turn her head completely.   Assessment/Plan: Occipital Scalp/ neck abscess  -Did not tolerate outpatient oral antibiotic treatment with Bactrim and Keflex  -Status post I&D per Dr. Matthew Folks - culture reveals Staph- d/c Zosyn -Continue empiric Vanc -Continue pain management   Active Problems:  Uncontrolled hypertension:  Continue home dose of lisinopril and monitor.   Type II DM with peripheral neuropathy.  - States that she was able to come off medications after gastric bypass surgery.  -sugars controlled- d/c NovoLog SSI.   History of polysubstance abuse:  - Patient states that she has been clean for a couple of months.  -Judicious use of narcotics.   Code Status: Full code Family Communication: none Disposition Plan: home  Consultants: gen surg  Procedures: 7/28- I and D  Antibiotics: Anti-infectives   Start     Dose/Rate Route Frequency Ordered Stop   09/24/13 1830  vancomycin (VANCOCIN) IVPB 750 mg/150 ml premix     750 mg 150 mL/hr over 60 Minutes Intravenous Every 8 hours 09/24/13 1730     09/24/13 1800  vancomycin (VANCOCIN) IVPB 1000 mg/200 mL premix  Status:  Discontinued     1,000 mg 200 mL/hr over 60 Minutes Intravenous Every 12 hours 09/24/13 1625 09/24/13 1729   09/24/13 1800  piperacillin-tazobactam (ZOSYN) IVPB 3.375 g     3.375 g 12.5 mL/hr over 240 Minutes Intravenous Every 8 hours 09/24/13 1625         DVT  prophylaxis: Lovenox  Objective: Filed Weights   09/25/13 2022  Weight: 77.111 kg (170 lb)    Intake/Output Summary (Last 24 hours) at 09/26/13 0825 Last data filed at 09/26/13 0636  Gross per 24 hour  Intake   1980 ml  Output    550 ml  Net   1430 ml     Vitals Filed Vitals:   09/25/13 1325 09/25/13 1948 09/25/13 2022 09/26/13 0459  BP: 131/99 106/68  103/69  Pulse: 76 85  74  Temp: 97.5 F (36.4 C) 97.9 F (36.6 C)  97.9 F (36.6 C)  TempSrc: Oral Oral  Oral  Resp: 16 16  16   Height:   5' 2.99" (1.6 m)   Weight:   77.111 kg (170 lb)   SpO2: 100% 94%  92%    Exam: General: No acute respiratory distress Neck: dressing on neck not completely removed- erythema and tenderness noted on neck Lungs: Clear to auscultation bilaterally without wheezes or crackles Cardiovascular: Regular rate and rhythm without murmur gallop or rub normal S1 and S2 Abdomen: Nontender, nondistended, soft, bowel sounds positive, no rebound, no ascites, no appreciable mass Extremities: No significant cyanosis, clubbing, or edema bilateral lower extremities  Data Reviewed: Basic Metabolic Panel:  Recent Labs Lab 09/24/13 1635 09/26/13 0550  NA 136* 141  K 3.6* 3.7  CL 95* 100  CO2 24 28  GLUCOSE 85 85  BUN 15 10  CREATININE 0.51 0.56  CALCIUM 9.6 9.0   Liver  Function Tests:  Recent Labs Lab 09/24/13 1635  AST 15  ALT 9  ALKPHOS 118*  BILITOT 0.3  PROT 8.0  ALBUMIN 3.7   No results found for this basename: LIPASE, AMYLASE,  in the last 168 hours No results found for this basename: AMMONIA,  in the last 168 hours CBC:  Recent Labs Lab 09/24/13 1635 09/26/13 0550  WBC 11.6* 5.6  NEUTROABS 7.9*  --   HGB 14.6 12.2  HCT 41.9 36.8  MCV 84.3 86.6  PLT 385 324   Cardiac Enzymes: No results found for this basename: CKTOTAL, CKMB, CKMBINDEX, TROPONINI,  in the last 168 hours BNP (last 3 results) No results found for this basename: PROBNP,  in the last 8760  hours CBG:  Recent Labs Lab 09/25/13 1213 09/25/13 1308 09/25/13 1633 09/25/13 2137 09/26/13 0631  GLUCAP 65* 87 139* 110* 88    Recent Results (from the past 240 hour(s))  CULTURE, ROUTINE-ABSCESS     Status: None   Collection Time    09/24/13  4:28 PM      Result Value Ref Range Status   Specimen Description ABSCESS DRAINAGE   Final   Special Requests FROM BACK OF HEAD   Final   Gram Stain     Final   Value: MODERATE WBC PRESENT,BOTH PMN AND MONONUCLEAR     NO SQUAMOUS EPITHELIAL CELLS SEEN     FEW GRAM POSITIVE COCCI IN PAIRS     IN CLUSTERS     Performed at [facility]   Culture     Final   Value: ABUNDANT STAPHYLOCOCCUS AUREUS     Note: RIFAMPIN AND GENTAMICIN SHOULD NOT BE USED AS SINGLE DRUGS FOR TREATMENT OF STAPH INFECTIONS.     Performed at [facility]   Report Status PENDING   Incomplete  SURGICAL PCR SCREEN     Status: Abnormal   Collection Time    09/24/13 10:59 PM      Result Value Ref Range Status   MRSA, PCR POSITIVE (*) NEGATIVE Final   Comment: CRITICAL RESULT CALLED TO, READ BACK BY AND VERIFIED WITH:     W Caryl NeverGREGSON,RN 09/25/13 0042 RHOLMES   Staphylococcus aureus POSITIVE (*) NEGATIVE Final   Comment:            The Xpert SA Assay (FDA     approved for NASAL specimens     in patients over 41 years of age),     is one component of     a comprehensive surveillance     program.  Test performance has     been validated by [facility] for patients greater     than or equal to 41 year old.     It is not intended     to diagnose infection nor to     guide or monitor treatment.     CRITICAL RESULT CALLED TO, READ BACK BY AND VERIFIED WITH:     W Caryl NeverGREGSON,RN 09/25/13 0042 RHOLMES     Studies:  Recent x-ray studies have been reviewed in detail by the Attending Physician  Scheduled Meds:  Scheduled Meds: . chlorhexidine   Topical Once  . dextrose  25 g Intravenous Once  . DULoxetine  60 mg Oral Daily  . enoxaparin (LOVENOX)  injection  40 mg Subcutaneous Q24H  . gabapentin  400 mg Oral 6 X Daily  . insulin aspart  0-9 Units Subcutaneous TID WC  . lisinopril  30  mg Oral BID  . mupirocin ointment   Nasal BID  . pantoprazole (PROTONIX) IV  40 mg Intravenous Q24H  . piperacillin-tazobactam (ZOSYN)  IV  3.375 g Intravenous Q8H  . scopolamine  1 patch Transdermal Q72H  . vancomycin  750 mg Intravenous Q8H   Continuous Infusions: . lactated ringers 50 mL/hr at 09/25/13 1900    Time spent on care of this patient: 35 min   Jimi Schappert, MD 09/26/2013, 8:25 AM  LOS: 2 days   [facility] Office  [phone] Pager - Text Page per [email or web address]  If 7PM-7AM, please contact night-coverage [email or web address]

## 2013-09-26 NOTE — Progress Notes (Signed)
hypo

## 2013-09-26 NOTE — Progress Notes (Signed)
[facility] Progress Note 1 Day Post-Op  Subjective: Doing okay.  Sore.  Objective: Vital signs in last 24 hours: Temp:  [97.5 F (36.4 C)-97.9 F (36.6 C)] 97.9 F (36.6 C) (07/29 0459) Pulse Rate:  [69-85] 74 (07/29 0459) Resp:  [8-16] 16 (07/29 0459) BP: (103-142)/(60-99) 103/69 mmHg (07/29 1003) SpO2:  [92 %-100 %] 92 % (07/29 0459) Weight:  [77.111 kg (170 lb)] 77.111 kg (170 lb) (07/28 2022) Last BM Date: 09/21/13  Intake/Output from previous day: 07/28 0701 - 07/29 0700 In: 1980 [P.O.:600; I.V.:580; IV Piggyback:800] Out: 550 [Urine:550] Intake/Output this shift: Total I/O In: 30 [P.O.:30] Out: -   General: No acute distress  Lungs: Clear  Abd: Benign  Extremities: Intact, no DVT  Neuro: Abscess area covered.  Will remove packing tomorrow.  Lab Results:  @LABLAST2 (wbc:2,hgb:2,hct:2,plt:2) BMET  Recent Labs  09/24/13 1635 09/26/13 0550  NA 136* 141  K 3.6* 3.7  CL 95* 100  CO2 24 28  GLUCOSE 85 85  BUN 15 10  CREATININE 0.51 0.56  CALCIUM 9.6 9.0   PT/INR  Recent Labs  09/24/13 1635  LABPROT 12.6  INR 0.94   ABG No results found for this basename: PHART, PCO2, PO2, HCO3,  in the last 72 hours  Studies/Results: No results found.  Anti-infectives: Anti-infectives   Start     Dose/Rate Route Frequency Ordered Stop   09/24/13 1830  vancomycin (VANCOCIN) IVPB 750 mg/150 ml premix     750 mg 150 mL/hr over 60 Minutes Intravenous Every 8 hours 09/24/13 1730     09/24/13 1800  vancomycin (VANCOCIN) IVPB 1000 mg/200 mL premix  Status:  Discontinued     1,000 mg 200 mL/hr over 60 Minutes Intravenous Every 12 hours 09/24/13 1625 09/24/13 1729   09/24/13 1800  piperacillin-tazobactam (ZOSYN) IVPB 3.375 g     3.375 g 12.5 mL/hr over 240 Minutes Intravenous Every 8 hours 09/24/13 1625        Assessment/Plan: s/p Procedure(s): INCISION AND DRAINAGE ABSCESS Plan for discharge tomorrow Change packing tomorrow. Although the patient is MRSA PCR  positive, her cultures ar not growing out MRSA.  Need to decide what to treat with going home.  LOS: 2 days   Marta LamasJames O. Gae BonWyatt, III, MD, FACS [phone]--pager [phone]--office [facility] 09/26/2013

## 2013-09-26 NOTE — Progress Notes (Signed)
Hypoglycemic Event  CBG:63  Treatment: 15 GM carbohydrate snack Symptoms: None  Follow-up CBG: Time1230 CBG Result:108  Possible Reasons for Event: Inadequate meal intake  Comments/MD notified no    Bethany SitesHowell, [name]  Remember to initiate Hypoglycemia Order Set & complete

## 2013-09-27 LAB — CULTURE, ROUTINE-ABSCESS

## 2013-09-27 LAB — GLUCOSE, CAPILLARY
Glucose-Capillary: 115 mg/dL — ABNORMAL HIGH (ref 70–99)
Glucose-Capillary: 61 mg/dL — ABNORMAL LOW (ref 70–99)
Glucose-Capillary: 112 mg/dL — ABNORMAL HIGH (ref 70–99)

## 2013-09-27 MED ORDER — DICLOXACILLIN SODIUM 500 MG PO CAPS: 500.0000 mg | ORAL_CAPSULE | Freq: Four times a day (QID) | ORAL | Status: DC

## 2013-09-27 MED ORDER — TRAMADOL HCL 50 MG PO TABS: 50.0000 mg | ORAL_TABLET | Freq: Four times a day (QID) | ORAL | Status: DC | PRN

## 2013-09-27 MED ORDER — HYDROMORPHONE HCL PF 1 MG/ML IJ SOLN
1.0000 mg | Freq: Once | INTRAMUSCULAR | Status: AC
  Administered 2013-09-27: 1 mg via INTRAVENOUS

## 2013-09-27 MED ORDER — DICLOXACILLIN SODIUM 500 MG PO CAPS
500.0000 mg | ORAL_CAPSULE | Freq: Four times a day (QID) | ORAL | Status: DC
  Administered 2013-09-27: 500 mg via ORAL
  Filled 2013-09-27 (×5): qty 1

## 2013-09-27 NOTE — Care Management Note (Signed)
CARE MANAGEMENT NOTE 09/27/2013  Patient:  Bethany Barton,Bethany Barton   Account Number:  [id number]  Date Initiated:  09/27/2013  Documentation initiated by:  Vance PeperBRADY,Eowyn Tabone  Subjective/Objective Assessment:   43 yr old female admitted with a posterior neck abscess. S/P I & D.     Action/Plan:   Case manager spoke with patient concerning home health needs. Patient's aunt is willing to learn how to do dressing changes. Choice offered. [facility]   Anticipated DC Date:  09/28/2013   Anticipated DC Plan:  HOME W HOME HEALTH SERVICES      DC Planning Services  CM consult      [facility]   Choice offered to / List presented to:  C-1 Patient   DME arranged  NA        HH arranged  HH-2 PT      HH agency  [facility]   Status of service:  Completed, signed off Medicare Important Message given?   (If response is "NO", the following Medicare IM given date fields will be blank) Date Medicare IM given:   Medicare IM given by:   Date Additional Medicare IM given:   Additional Medicare IM given by:    Discharge Disposition:  HOME W HOME HEALTH SERVICES  Per UR Regulation:  Reviewed for med. necessity/level of care/duration of stay  If discussed at Long Length of Stay Meetings, dates discussed:    Comments:  09/27/13 3:14pm Vance PeperSusan Cara Thaxton, RN BSN Case Manager Cala BradfordKimberly [facility] informed case manager that they will not be able to provide service for patient. Case manager informed patient of this, received authorization to contact [facility]. Referral called to Jodene NamMary Hickling, [facility] liaison. Start of care 09/28/13.

## 2013-09-27 NOTE — Discharge Summary (Signed)
Physician Discharge Summary  Bethany RiasBrandy Peden ZOX:[id number] DOB: 06/26/1970 DOA: 09/24/2013  PCP: Wilson SingerGOSRANI,NIMISH C, MD  Admit date: 09/24/2013 Discharge date: 09/27/2013  Time spent: >35 minutes   Discharge Diagnoses:  Principal Problem:   Scalp abscess Active Problems:   Opioid dependence   Hypertension   DM type 2, uncontrolled, with neuropathy   Essential hypertension, benign   Nausea and vomiting   Discharge Condition: stable  Diet recommendation: heart healthy, low sodium  Filed Weights   09/25/13 2022  Weight: 77.111 kg (170 lb)    History of present illness- (Dr Waymon AmatoHongalgi) Bethany RiasBrandy 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 She gives a one-week history of noticing some matted hair which was sore at the back of her neck. The next day she noticed a painful bump which she continued to topical treatment with warm compresses and cleaning with soap and water. This however progressively gotten worse, ruptured and started draining pus. She was seen in the emergency department on 09/21/13 and was found to have 2 spontaneously draining abscesses in the occipital scalp. No further fluctuance was palpated but there was erythema and induration. She was discharged home on oral Keflex and Bactrim. She states that these have not really helped the abscess. However she has had intractable nausea and non-bloody vomiting following Bactrim. She has had low-grade fever of 100.29F. No chills. Throbbing intermittent pain at the swelling site which is nonradiating, made worse to neck movement and touching. Continues to drain greenish yellow pus. She denies abdominal pain or diarrhea. Unable to eat much secondary to vomiting. She contacted her PCP and was advised to [facility] for [facility] but  due to some misunderstanding, [facility]. Labs have been requested and are yet to be drawn.   Hospital Course:  Occipital Scalp/ neck abscess - MSSA -Did not tolerate outpatient oral antibiotic treatment with Bactrim and Keflex - Started on Vanc and Zosyn in the hospital -Status post I&D per Dr. Matthew FolksWyat  - culture reveals staph- d/c Zosyn on 7/29  - sensitivities reveal MSSA- although MRSA PCR was positive, due to to fact that there is no MRSA in the wound, can narrow to Dicloxacillin on d/c- will ensure she can tolerate by mouth today prior to d/c later today - will f/u with Dr Lindie SpruceWyatt at outpt- Dr Lindie SpruceWyatt has showed family how to do dressings-[facility]  Active Problems:  Uncontrolled hypertension:  Continue home dose of lisinopril   Type II DM with peripheral neuropathy.  - States that she was able to come off medications after gastric bypass surgery.  -sugars controlled- d/c NovoLog SSI.   History of polysubstance abuse:  - Benzodiazepine and Cocaine positive in 07/24/13 - Patient states that she has been clean for a couple of months.  -not being given Narcotics on d/c     Procedures: 7/28- I and D   Consultations:  gen surgery- dR [name]  Discharge Exam: Filed Vitals:   09/27/13 0508  BP: 112/64  Pulse: 66  Temp: 98.1 F (36.7 C)  Resp: 16    General: AAO x 3 Neck: - abscess cavity is packed - surrounding erythema present but much improved from admission Cardiovascular: RRR, no murmurs Respiratory: CTA b/l   Discharge Instructions You were cared  for by a hospitalist during your hospital stay. If you have any questions about your discharge medications or the care you received while you were in the hospital after you are discharged, you can call the unit and asked to speak with the hospitalist on call if the hospitalist that took care of you is not available. Once you are discharged, your primary care physician will  handle any further medical issues. Please note that NO REFILLS for any discharge medications will be authorized once you are discharged, as it is imperative that you return to your primary care physician (or establish a relationship with a primary care physician if you do not have one) for your aftercare needs so that they can reassess your need for medications and monitor your lab values.      Discharge Instructions   Diet - low sodium heart healthy    Complete by:  As directed      Increase activity slowly    Complete by:  As directed             Medication List    STOP taking these medications       cephALEXin 500 MG capsule  Commonly known as:  KEFLEX      TAKE these medications       acetaminophen 325 MG tablet  Commonly known as:  TYLENOL  Take 325 mg by mouth every 6 (six) hours as needed (pain).     aspirin-acetaminophen-caffeine 250-250-65 MG per tablet  Commonly known as:  EXCEDRIN MIGRAINE  Take 2 tablets by mouth every 6 (six) hours as needed for headache or migraine.     dicloxacillin 500 MG capsule  Commonly known as:  DYNAPEN  Take 1 capsule (500 mg total) by mouth every 6 (six) hours.     DULoxetine 60 MG capsule  Commonly known as:  CYMBALTA  Take 60 mg by mouth daily.     gabapentin 400 MG capsule  Commonly known as:  NEURONTIN  Take 1 capsule (400 mg total) by mouth 6 (six) times daily.     lisinopril 30 MG tablet  Commonly known as:  PRINIVIL,ZESTRIL  Take 1 tablet (30 mg total) by mouth 2 (two) times daily.     multivitamin with minerals Tabs tablet  Take 1 tablet by mouth daily.     naproxen 250 MG tablet  Commonly known as:  NAPROSYN  Take 1 tablet (250 mg total) by mouth 2 (two) times daily as needed for mild pain or moderate pain (Take with food.).     NUVIGIL 200 MG Tabs  Generic drug:  Armodafinil  Take 200 mg by mouth daily as needed (for pain).     rizatriptan 10 MG tablet  Commonly known as:  MAXALT  Take 10 mg by mouth as  needed for migraine. May repeat in 2 hours if needed     traMADol 50 MG tablet  Commonly known as:  ULTRAM  Take 1-2 tablets (50-100 mg total) by mouth every 6 (six) hours as needed for moderate pain.       Allergies  Allergen Reactions  . Iodine Other (See Comments)    Blisters  . Neomycin Other (See Comments)    Blisters   . Phenergan [Promethazine Hcl] Other (See Comments)    Altered Mental Status  . Sulfa Antibiotics Nausea And Vomiting      The results of significant diagnostics from this hospitalization (including imaging, microbiology, ancillary and laboratory) are listed below for reference.  Significant Diagnostic Studies: No results found.  Microbiology: Recent Results (from the past 240 hour(s))  CULTURE, ROUTINE-ABSCESS     Status: None   Collection Time    09/24/13  4:28 PM      Result Value Ref Range Status   Specimen Description ABSCESS DRAINAGE   Final   Special Requests FROM BACK OF HEAD   Final   Gram Stain     Final   Value: MODERATE WBC PRESENT,BOTH PMN AND MONONUCLEAR     NO SQUAMOUS EPITHELIAL CELLS SEEN     FEW GRAM POSITIVE COCCI IN PAIRS     IN CLUSTERS     Performed at [facility]   Culture     Final   Value: ABUNDANT STAPHYLOCOCCUS AUREUS     Note: RIFAMPIN AND GENTAMICIN SHOULD NOT BE USED AS SINGLE DRUGS FOR TREATMENT OF STAPH INFECTIONS.     Performed at [facility]   Report Status 09/27/2013 FINAL   Final   Organism ID, Bacteria STAPHYLOCOCCUS AUREUS   Final  SURGICAL PCR SCREEN     Status: Abnormal   Collection Time    09/24/13 10:59 PM      Result Value Ref Range Status   MRSA, PCR POSITIVE (*) NEGATIVE Final   Comment: CRITICAL RESULT CALLED TO, READ BACK BY AND VERIFIED WITH:     W Caryl Never 09/25/13 0042 RHOLMES   Staphylococcus aureus POSITIVE (*) NEGATIVE Final   Comment:            The Xpert SA Assay (FDA     approved for NASAL specimens     in patients over 28 years of age),     is one component of      a comprehensive surveillance     program.  Test performance has     been validated by [employer] for patients greater     than or equal to 40 year old.     It is not intended     to diagnose infection nor to     guide or monitor treatment.     CRITICAL RESULT CALLED TO, READ BACK BY AND VERIFIED WITH:     Colletta Maryland 09/25/13 0042 RHOLMES     Labs: Basic Metabolic Panel:  Recent Labs Lab 09/24/13 1635 09/26/13 0550  NA 136* 141  K 3.6* 3.7  CL 95* 100  CO2 24 28  GLUCOSE 85 85  BUN 15 10  CREATININE 0.51 0.56  CALCIUM 9.6 9.0   Liver Function Tests:  Recent Labs Lab 09/24/13 1635  AST 15  ALT 9  ALKPHOS 118*  BILITOT 0.3  PROT 8.0  ALBUMIN 3.7   No results found for this basename: LIPASE, AMYLASE,  in the last 168 hours No results found for this basename: AMMONIA,  in the last 168 hours CBC:  Recent Labs Lab 09/24/13 1635 09/26/13 0550  WBC 11.6* 5.6  NEUTROABS 7.9*  --   HGB 14.6 12.2  HCT 41.9 36.8  MCV 84.3 86.6  PLT 385 324   Cardiac Enzymes: No results found for this basename: CKTOTAL, CKMB, CKMBINDEX, TROPONINI,  in the last 168 hours BNP: BNP (last 3 results) No results found for this basename: PROBNP,  in the last 8760 hours CBG:  Recent Labs Lab 09/26/13 1407 09/26/13 1616 09/26/13 2155 09/27/13 0648 09/27/13 0722  GLUCAP 108* 111* 98 61* 115*       SignedCalvert Cantor, MD  [facility] 09/27/2013,  10:02 AM

## 2013-09-27 NOTE — Progress Notes (Signed)
CBG 61.  Patient asymptomatic.  Four oz of OJ provided.  Will recheck cbg in 15-20 mins.

## 2013-09-27 NOTE — Progress Notes (Signed)
[facility] Progress Note 2 Days Post-Op  Subjective: Patient doing well.  Will come back at 0900 to perform dressing change.  Will have the nurse Nettie Elm(Sylvia) to collect atems.  Objective: Vital signs in last 24 hours: Temp:  [97.3 F (36.3 C)-98.1 F (36.7 C)] 98.1 F (36.7 C) (07/30 0508) Pulse Rate:  [56-90] 66 (07/30 0508) Resp:  [16] 16 (07/30 0508) BP: (103-116)/(64-73) 112/64 mmHg (07/30 0508) SpO2:  [97 %-99 %] 97 % (07/30 0508) Last BM Date: 09/21/13  Intake/Output from previous day: 07/29 0701 - 07/30 0700 In: 614.2 [P.O.:30; I.V.:234.2; IV Piggyback:350] Out: -  Intake/Output this shift:    General: No acute distress  Lungs: Clear  Abd: Benign  Extremities: no changes  Neuro: Intact  Posterior scalp--Covered with dry dressing.  Will change later today.  Lab Results:  @LABLAST2 (wbc:2,hgb:2,hct:2,plt:2) BMET  Recent Labs  09/24/13 1635 09/26/13 0550  NA 136* 141  K 3.6* 3.7  CL 95* 100  CO2 24 28  GLUCOSE 85 85  BUN 15 10  CREATININE 0.51 0.56  CALCIUM 9.6 9.0   PT/INR  Recent Labs  09/24/13 1635  LABPROT 12.6  INR 0.94   ABG No results found for this basename: PHART, PCO2, PO2, HCO3,  in the last 72 hours  Studies/Results: No results found.  Anti-infectives: Anti-infectives   Start     Dose/Rate Route Frequency Ordered Stop   09/24/13 1830  vancomycin (VANCOCIN) IVPB 750 mg/150 ml premix     750 mg 150 mL/hr over 60 Minutes Intravenous Every 8 hours 09/24/13 1730     09/24/13 1800  vancomycin (VANCOCIN) IVPB 1000 mg/200 mL premix  Status:  Discontinued     1,000 mg 200 mL/hr over 60 Minutes Intravenous Every 12 hours 09/24/13 1625 09/24/13 1729   09/24/13 1800  piperacillin-tazobactam (ZOSYN) IVPB 3.375 g  Status:  Discontinued     3.375 g 12.5 mL/hr over 240 Minutes Intravenous Every 8 hours 09/24/13 1625 09/26/13 1641      Assessment/Plan: s/p Procedure(s): INCISION AND DRAINAGE ABSCESS Patient has sensitive Staph growing  from the wound.  Oxacillin sensitive, but resistant to tetracycline. Redress wound then decide on appropriate oral antibiotic.  LOS: 3 days   Marta LamasJames O. Gae BonWyatt, III, MD, FACS [phone] [phone]--office [facility] 09/27/2013

## 2013-09-27 NOTE — Progress Notes (Signed)
Patient had crackers and OJ.  Re-checked CBG currently 115.  Information provided to oncoming staff.

## 2013-09-27 NOTE — Progress Notes (Signed)
Packing removed and the wound redressed.  Some bleeding but not a lot.  Very painful for the patient .  Will go home on doxycycline.  Family at the bedside to see the dressing change.  Marta LamasJames O. Gae BonWyatt, III, MD, FACS [phone]--pager [phone]--office [facility]

## 2013-10-06 ENCOUNTER — Other Ambulatory Visit: Payer: Self-pay | Admitting: Internal Medicine

## 2013-10-23 ENCOUNTER — Encounter (INDEPENDENT_AMBULATORY_CARE_PROVIDER_SITE_OTHER): Payer: Self-pay

## 2013-10-23 ENCOUNTER — Encounter (INDEPENDENT_AMBULATORY_CARE_PROVIDER_SITE_OTHER): Payer: Self-pay | Admitting: *Deleted

## 2013-10-23 ENCOUNTER — Ambulatory Visit (INDEPENDENT_AMBULATORY_CARE_PROVIDER_SITE_OTHER): Payer: Commercial Managed Care - PPO | Admitting: General Surgery

## 2013-10-23 VITALS — BP 130/80 | HR 75 | Ht 63.0 in | Wt 168.0 lb

## 2013-10-23 DIAGNOSIS — L03818 Cellulitis of other sites: Secondary | ICD-10-CM

## 2013-10-23 DIAGNOSIS — L02811 Cutaneous abscess of head [any part, except face]: Secondary | ICD-10-CM

## 2013-10-23 DIAGNOSIS — L02818 Cutaneous abscess of other sites: Secondary | ICD-10-CM

## 2013-10-23 NOTE — Progress Notes (Signed)
Bethany Barton 08-08-1970 [id number]  This is a 43 yo female who is s/p incision and drainage of a posterior base skull abscess 09/25/13 by Dr. Lindie Spruce.  She is doing well.  Packing was ceased 3 days ago.  She otherwise has no issues  PE: Head: wound is completely healed.  No further wound left to pack.  Ridge noted at the inferior portion from scar tissues  A/P: 1. I&D of posterior scalp abscess Plan: 1. No further care needed.  Wound is healed.  No further follow up needed.  She may return to work.

## 2013-10-23 NOTE — Patient Instructions (Signed)
Follow up as needed

## 2013-11-12 ENCOUNTER — Encounter: Payer: Self-pay | Admitting: Neurology

## 2013-11-12 ENCOUNTER — Ambulatory Visit (INDEPENDENT_AMBULATORY_CARE_PROVIDER_SITE_OTHER): Payer: 59 | Admitting: Neurology

## 2013-11-12 VITALS — BP 208/112 | HR 89 | Ht 64.0 in | Wt 163.0 lb

## 2013-11-12 DIAGNOSIS — G629 Polyneuropathy, unspecified: Secondary | ICD-10-CM

## 2013-11-12 DIAGNOSIS — G609 Hereditary and idiopathic neuropathy, unspecified: Secondary | ICD-10-CM

## 2013-11-12 DIAGNOSIS — F32A Depression, unspecified: Secondary | ICD-10-CM | POA: Insufficient documentation

## 2013-11-12 DIAGNOSIS — F419 Anxiety disorder, unspecified: Secondary | ICD-10-CM

## 2013-11-12 DIAGNOSIS — F329 Major depressive disorder, single episode, unspecified: Secondary | ICD-10-CM

## 2013-11-12 MED ORDER — NORTRIPTYLINE HCL 25 MG PO CAPS: ORAL_CAPSULE | ORAL | Status: DC

## 2013-11-12 MED ORDER — ELETRIPTAN HYDROBROMIDE 40 MG PO TABS: 40.0000 mg | ORAL_TABLET | ORAL | Status: DC | PRN

## 2013-11-12 MED ORDER — DULOXETINE HCL 60 MG PO CPEP: 60.0000 mg | ORAL_CAPSULE | Freq: Every day | ORAL | Status: DC

## 2013-11-12 MED ORDER — ARMODAFINIL 200 MG PO TABS: 200.0000 mg | ORAL_TABLET | Freq: Every day | ORAL | Status: DC

## 2013-11-12 MED ORDER — GABAPENTIN 400 MG PO CAPS: 400.0000 mg | ORAL_CAPSULE | Freq: Every day | ORAL | Status: DC
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
## 2013-11-12 NOTE — Progress Notes (Deleted)
Subjective:    Patient ID: Bethany Barton is a 43 y.o. female.  HPI {Common ambulatory SmartLinks:[id number]}  Review of Systems  Constitutional: Positive for fatigue.  Psychiatric/Behavioral: Positive for sleep disturbance.    Objective:  Neurologic Exam  Physical Exam  Assessment:   ***  Plan:   ***

## 2013-11-13 ENCOUNTER — Encounter (HOSPITAL_COMMUNITY): Payer: Self-pay | Admitting: Emergency Medicine

## 2013-11-13 ENCOUNTER — Emergency Department (HOSPITAL_COMMUNITY): Payer: 59

## 2013-11-13 ENCOUNTER — Emergency Department (HOSPITAL_COMMUNITY)
Admission: EM | Admit: 2013-11-13 | Discharge: 2013-11-13 | Disposition: A | Discharge status: Home / Self Care | Payer: 59 | Attending: Emergency Medicine | Admitting: Emergency Medicine

## 2013-11-13 DIAGNOSIS — E119 Type 2 diabetes mellitus without complications: Secondary | ICD-10-CM | POA: Insufficient documentation

## 2013-11-13 DIAGNOSIS — R030 Elevated blood-pressure reading, without diagnosis of hypertension: Secondary | ICD-10-CM

## 2013-11-13 DIAGNOSIS — G609 Hereditary and idiopathic neuropathy, unspecified: Secondary | ICD-10-CM | POA: Insufficient documentation

## 2013-11-13 DIAGNOSIS — F329 Major depressive disorder, single episode, unspecified: Secondary | ICD-10-CM | POA: Diagnosis not present

## 2013-11-13 DIAGNOSIS — Z79899 Other long term (current) drug therapy: Secondary | ICD-10-CM | POA: Insufficient documentation

## 2013-11-13 DIAGNOSIS — M25579 Pain in unspecified ankle and joints of unspecified foot: Secondary | ICD-10-CM | POA: Diagnosis not present

## 2013-11-13 DIAGNOSIS — Z9884 Bariatric surgery status: Secondary | ICD-10-CM | POA: Diagnosis not present

## 2013-11-13 DIAGNOSIS — F3289 Other specified depressive episodes: Secondary | ICD-10-CM | POA: Insufficient documentation

## 2013-11-13 DIAGNOSIS — Z7982 Long term (current) use of aspirin: Secondary | ICD-10-CM | POA: Diagnosis not present

## 2013-11-13 DIAGNOSIS — Z9889 Other specified postprocedural states: Secondary | ICD-10-CM | POA: Insufficient documentation

## 2013-11-13 DIAGNOSIS — F411 Generalized anxiety disorder: Secondary | ICD-10-CM | POA: Insufficient documentation

## 2013-11-13 DIAGNOSIS — I10 Essential (primary) hypertension: Secondary | ICD-10-CM | POA: Diagnosis not present

## 2013-11-13 DIAGNOSIS — M79671 Pain in right foot: Secondary | ICD-10-CM

## 2013-11-13 DIAGNOSIS — IMO0001 Reserved for inherently not codable concepts without codable children: Secondary | ICD-10-CM

## 2013-11-13 DIAGNOSIS — G43909 Migraine, unspecified, not intractable, without status migrainosus: Secondary | ICD-10-CM | POA: Insufficient documentation

## 2013-11-13 MED ORDER — CYCLOBENZAPRINE HCL 10 MG PO TABS
10.0000 mg | ORAL_TABLET | Freq: Once | ORAL | Status: AC
  Administered 2013-11-13: 10 mg via ORAL

## 2013-11-13 MED ORDER — CYCLOBENZAPRINE HCL 10 MG PO TABS
ORAL_TABLET | ORAL | Status: AC
  Filled 2013-11-13: qty 1

## 2013-11-13 MED ORDER — CYCLOBENZAPRINE HCL 10 MG PO TABS
10.0000 mg | ORAL_TABLET | Freq: Once | ORAL | Status: AC
  Administered 2013-11-13: 10 mg via ORAL
  Filled 2013-11-13: qty 1

## 2013-11-13 MED ORDER — KETOROLAC TROMETHAMINE 60 MG/2ML IM SOLN
60.0000 mg | Freq: Once | INTRAMUSCULAR | Status: AC
  Administered 2013-11-13: 60 mg via INTRAMUSCULAR
  Filled 2013-11-13: qty 2

## 2013-11-13 MED ORDER — CYCLOBENZAPRINE HCL 10 MG PO TABS: 10.0000 mg | ORAL_TABLET | Freq: Two times a day (BID) | ORAL | Status: DC | PRN

## 2013-11-13 NOTE — ED Notes (Signed)
PT c/o right foot burning pain in the first and second digit. PT states had surgery on tendons and fusion on right foot years ago. PT denies any recent injury to foot.

## 2013-11-13 NOTE — ED Provider Notes (Signed)
CSN: [id number]     Arrival date & time 11/13/13  1751 History   First MD Initiated Contact with Patient 11/13/13 1943     Chief Complaint  Patient presents with  . Foot Pain     (Consider location/radiation/quality/duration/timing/severity/associated sxs/prior Treatment) Patient is a 43 y.o. female presenting with lower extremity pain. The history is provided by the patient.  Foot Pain This is a new problem. The current episode started in the past 7 days. The problem occurs constantly. The problem has been gradually worsening. The symptoms are aggravated by walking. She has tried acetaminophen for the symptoms.   Bethany Barton is a 43 y.o. female who presents to the ED with burning pain to the right foot. She has had multiple surgeries to the right foot and ankle since 1988. She is currently under the care of orthopedic doctors at [facility]. This pain started 3 days ago. She does not remember any injury. She saw the neurologist yesterday and he is aware of her elevated BP and she is taking medication. The doctor seem to think it may be due to pain.   Past Medical History  Diagnosis Date  . Hypertension   . Peripheral neuropathy   . Facial tic   . Depression   . Diabetes mellitus without complication   . Polysubstance abuse     etoh, opiates  . Migraines   . Depression   . Anxiety    Past Surgical History  Procedure Laterality Date  . 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 Abdominal hysterectomy  2013  . Ankle fusion Right 1991  . Appendectomy  1987  . Cesarean section  1997  . Abdominoplasty  2013  . Cholecystectomy    . Incision and drainage abscess N/A 09/25/2013    Procedure: INCISION AND DRAINAGE ABSCESS;  Surgeon: Cherylynn Ridges, MD;  Location: [facility] OR;  Service: General;  Laterality: N/A;   Family History  Problem Relation Age of Onset  . Bipolar disorder Sister   . Drug abuse Sister   . Schizophrenia Son   . Bipolar disorder Father   . Alcohol  abuse Father   . Drug abuse Mother   . Alcohol abuse Mother    History  Substance Use Topics  . Smoking status: Never Smoker   . Smokeless tobacco: Never Used  . Alcohol Use: 4.8 oz/week    8 Glasses of wine per week     Comment: history of cocaine and narcotic dependence 07/25/13   OB History   Grav Para Term Preterm Abortions TAB SAB Ect Mult Living                 Review of Systems Negative except as stated in HPI   Allergies  Iodine; Neomycin; Phenergan; and Sulfa antibiotics  Home Medications   Prior to Admission medications   Medication Sig Start Date End Date Taking? Authorizing Provider  acetaminophen (TYLENOL) 325 MG tablet Take 650 mg by mouth every 6 (six) hours as needed (pain).     Historical Provider, MD  Armodafinil (NUVIGIL) 200 MG TABS Take 200 mg by mouth daily. 11/12/13   Levert Feinstein, MD  aspirin-acetaminophen-caffeine (EXCEDRIN MIGRAINE) [phone] MG per tablet Take 2 tablets by mouth every 6 (six) hours as needed for headache or migraine.    Historical Provider, MD  DULoxetine (CYMBALTA) 60 MG capsule Take 1 capsule (60 mg total) by mouth daily. 11/12/13   Levert Feinstein, MD  eletriptan (RELPAX)  40 MG tablet Take 1 tablet (40 mg total) by mouth as needed for migraine or headache. One tablet by mouth at onset of headache. May repeat in 2 hours if headache persists or recurs. 11/12/13   Levert Feinstein, MD  estradiol (CLIMARA - DOSED IN MG/24 HR) 0.075 mg/24hr patch Place 0.075 mg onto the skin once a week.    Historical Provider, MD  gabapentin (NEURONTIN) 400 MG capsule Take 1 capsule (400 mg total) by mouth 6 (six) times daily. 11/12/13   Levert Feinstein, MD  lisinopril (PRINIVIL,ZESTRIL) 30 MG tablet Take 1 tablet (30 mg total) by mouth 2 (two) times daily. 07/25/13   Beau Fanny, FNP  Multiple Vitamin (MULTIVITAMIN WITH MINERALS) TABS tablet Take 1 tablet by mouth daily.    Historical Provider, MD  nortriptyline (PAMELOR) 25 MG capsule One po qhs xone week, then 2 tabs po qhs  11/12/13   Levert Feinstein, MD  progesterone (ENDOMETRIN) 100 MG vaginal insert Place 100 mg vaginally 2 (two) times daily.    Historical Provider, MD  rizatriptan (MAXALT) 10 MG tablet Take 10 mg by mouth as needed for migraine. May repeat in 2 hours if needed    Historical Provider, MD   BP 173/119  Pulse 98  Temp(Src) 98.4 F (36.9 C) (Oral)  Resp 18  Ht  (1.626 m)  Wt 163 lb (73.936 kg)  BMI 27.97 kg/m2  SpO2 100% Physical Exam  Nursing note and vitals reviewed. Constitutional: She is oriented to person, place, and time. She appears well-developed and well-nourished.  HENT:  Head: Normocephalic.  Eyes: EOM are normal.  Neck: Neck supple.  Cardiovascular: Normal rate.   Pulmonary/Chest: Effort normal.  Musculoskeletal:       Right foot: She exhibits decreased range of motion and tenderness. She exhibits no swelling, normal capillary refill, no crepitus and no laceration. Deformity: from previous surgeries.  Neurological: She is alert and oriented to person, place, and time. No cranial nerve deficit.  Skin: Skin is warm and dry.  Psychiatric: She has a normal mood and affect. Her behavior is normal.    ED Course  Procedures (including critical care time) Labs Review Dg Foot Complete Right  11/13/2013   CLINICAL DATA:  Foot pain  EXAM: RIGHT FOOT COMPLETE - 3+ VIEW  COMPARISON:  None.  FINDINGS: The ankle joint is fused. Prominent degenerative changes of the subtalar joint. No acute fracture. No dislocation.  IMPRESSION: No acute bony pathology.  Chronic and postoperative changes.   Electronically Signed   By: Maryclare Bean M.D.   On: 11/13/2013 20:10    MDM  43 y.o. female with burning pain to the right foot especially the first and second toes. The pain radiates to the ankle. ASO applied, muscle relaxants. Patient request NO narcotics. Stable for discharge without neurovascular compromise. She will follow up with the orthopedic doctor who did her surgery this week. She will follow up  with her PCP as planned for her elevated BP. (she has started new medications)   Medication List    TAKE these medications       cyclobenzaprine 10 MG tablet  Commonly known as:  FLEXERIL  Take 1 tablet (10 mg total) by mouth 2 (two) times daily as needed for muscle spasms.      ASK your doctor about these medications       acetaminophen 325 MG tablet  Commonly known as:  TYLENOL  Take 650 mg by mouth every 6 (six) hours as needed (  pain).     Armodafinil 200 MG Tabs  Commonly known as:  NUVIGIL  Take 200 mg by mouth daily.     aspirin-acetaminophen-caffeine 250-250-65 MG per tablet  Commonly known as:  EXCEDRIN MIGRAINE  Take 2 tablets by mouth every 6 (six) hours as needed for headache or migraine.     DULoxetine 60 MG capsule  Commonly known as:  CYMBALTA  Take 1 capsule (60 mg total) by mouth daily.     eletriptan 40 MG tablet  Commonly known as:  RELPAX  Take 1 tablet (40 mg total) by mouth as needed for migraine or headache. One tablet by mouth at onset of headache. May repeat in 2 hours if headache persists or recurs.     estradiol 0.075 mg/24hr patch  Commonly known as:  CLIMARA - Dosed in mg/24 hr  Place 0.075 mg onto the skin once a week.     gabapentin 400 MG capsule  Commonly known as:  NEURONTIN  Take 1 capsule (400 mg total) by mouth 6 (six) times daily.     lisinopril 30 MG tablet  Commonly known as:  PRINIVIL,ZESTRIL  Take 1 tablet (30 mg total) by mouth 2 (two) times daily.     multivitamin with minerals Tabs tablet  Take 1 tablet by mouth daily.     nortriptyline 25 MG capsule  Commonly known as:  PAMELOR  One po qhs xone week, then 2 tabs po qhs     progesterone 100 MG vaginal insert  Commonly known as:  ENDOMETRIN  Place 100 mg vaginally 2 (two) times daily.     rizatriptan 10 MG tablet  Commonly known as:  MAXALT  Take 10 mg by mouth as needed for migraine. May repeat in 2 hours if needed         [facility], NP 11/14/13 [phone]

## 2013-11-15 NOTE — ED Provider Notes (Signed)
Medical screening examination/treatment/procedure(s) were performed by non-physician practitioner and as supervising physician I was immediately available for consultation/collaboration.   EKG Interpretation None       Gerry Heaphy, MD 11/15/13 0220 

## 2013-11-22 ENCOUNTER — Telehealth: Payer: Self-pay | Admitting: Neurology

## 2013-11-22 NOTE — Telephone Encounter (Signed)
Patient calling to state that her referral for pain management was sent to [facility] instead of [facility]. Please return call and advise.

## 2013-11-22 NOTE — Telephone Encounter (Signed)
Patient calling to speak with Bethany Barton. Regarding her referral, please return call and advise.

## 2013-11-22 NOTE — Telephone Encounter (Signed)
Called Patient and spoke to her about referral. Patient understood process.

## 2013-11-23 ENCOUNTER — Ambulatory Visit (INDEPENDENT_AMBULATORY_CARE_PROVIDER_SITE_OTHER): Payer: 59 | Admitting: Neurology

## 2013-11-23 ENCOUNTER — Encounter (INDEPENDENT_AMBULATORY_CARE_PROVIDER_SITE_OTHER): Payer: Self-pay

## 2013-11-23 ENCOUNTER — Other Ambulatory Visit (INDEPENDENT_AMBULATORY_CARE_PROVIDER_SITE_OTHER): Payer: Self-pay

## 2013-11-23 DIAGNOSIS — F419 Anxiety disorder, unspecified: Secondary | ICD-10-CM

## 2013-11-23 DIAGNOSIS — F32A Depression, unspecified: Secondary | ICD-10-CM

## 2013-11-23 DIAGNOSIS — Z0289 Encounter for other administrative examinations: Secondary | ICD-10-CM

## 2013-11-23 DIAGNOSIS — E1140 Type 2 diabetes mellitus with diabetic neuropathy, unspecified: Secondary | ICD-10-CM

## 2013-11-23 DIAGNOSIS — R209 Unspecified disturbances of skin sensation: Secondary | ICD-10-CM

## 2013-11-23 DIAGNOSIS — G629 Polyneuropathy, unspecified: Secondary | ICD-10-CM

## 2013-11-23 DIAGNOSIS — F329 Major depressive disorder, single episode, unspecified: Secondary | ICD-10-CM

## 2013-11-23 DIAGNOSIS — F1014 Alcohol abuse with alcohol-induced mood disorder: Secondary | ICD-10-CM

## 2013-11-23 DIAGNOSIS — G609 Hereditary and idiopathic neuropathy, unspecified: Secondary | ICD-10-CM

## 2013-11-23 DIAGNOSIS — IMO0002 Reserved for concepts with insufficient information to code with codable children: Secondary | ICD-10-CM

## 2013-11-23 DIAGNOSIS — E1165 Type 2 diabetes mellitus with hyperglycemia: Secondary | ICD-10-CM

## 2013-11-23 MED ORDER — NORTRIPTYLINE HCL 25 MG PO CAPS: ORAL_CAPSULE | ORAL | Status: DC

## 2013-11-23 NOTE — Telephone Encounter (Signed)
Called and spoke to patient about referral. Patient understood process.

## 2013-11-23 NOTE — Procedures (Signed)
   NCS (NERVE CONDUCTION STUDY) WITH EMG (ELECTROMYOGRAPHY) REPORT   STUDY DATE: September 25th 2015 PATIENT NAME: Bethany Barton DOB: 1970/09/28 MRN: [id number]    TECHNOLOGIST: Gearldine Shown ELECTROMYOGRAPHER: Levert Feinstein M.D.  CLINICAL INFORMATION:  43 years old female, with previous history of right ankle fusion, diabetes, presenting with chronic bilateral feet paresthesia,  FINDINGS: NERVE CONDUCTION STUDY:  Bilateral peroneal sensory responses were normal. Bilateral peroneal, tibial motor responses were normal. Tibial H. reflexes were normal and symmetric.  NEEDLE ELECTROMYOGRAPHY: Selected needle examination was performed at left lower extremity muscles, left lumbosacral paraspinal muscles.  Needle examination of left tibialis anterior, tibialis posterior, medial gastrocnemius, peroneal longus, vastus lateralis, biceps femoris short head was normal.  There was no spontaneous activity at left lumbosacral paraspinal muscles, left L4, L5, S1.  IMPRESSION:  This is a normal study. There was no electrodiagnostic evidence of large fiber peripheral neuropathy, and left lumbar radiculopathy, but the study is limited, the small fiber neuropathy, which is commonly associated with diabetes, cannot be detected by current study.   INTERPRETING PHYSICIAN:   Levert Feinstein M.D. Ph.D. [facility] [address] [place], [place] [address] [phone]

## 2013-11-28 LAB — VITAMIN B12: Vitamin B-12: 424 pg/mL (ref 211–946)

## 2013-11-28 LAB — HGB A1C W/O EAG: Hgb A1c MFr Bld: 5.5 % (ref 4.8–5.6)

## 2013-11-28 LAB — VITAMIN D 1,25 DIHYDROXY: VIT D 1 25 DIHYDROXY: 78.8 pg/mL (ref 19.9–79.3)

## 2013-11-28 LAB — THYROID PANEL WITH TSH
FREE THYROXINE INDEX: 1.5 (ref 1.2–4.9)
T3 Uptake Ratio: 25 % (ref 24–39)
T4, Total: 5.9 ug/dL (ref 4.5–12.0)
TSH: 1.67 u[IU]/mL (ref 0.450–4.500)

## 2013-11-28 LAB — FOLATE: Folate: 11.7 ng/mL (ref >3.0)

## 2013-11-28 LAB — C-REACTIVE PROTEIN: CRP: 2.8 mg/L (ref 0.0–4.9)

## 2013-11-28 LAB — HEPATITIS PANEL, ACUTE
HEP A IGM: NEGATIVE
Hep B C IgM: NEGATIVE
Hep C Virus Ab: <0.1 s/co ratio (ref 0.0–0.9)
Hepatitis B Surface Ag: NEGATIVE

## 2013-11-28 LAB — RPR: RPR: NONREACTIVE

## 2013-11-28 LAB — ANA W/REFLEX IF POSITIVE: Anti Nuclear Antibody(ANA): NEGATIVE

## 2013-11-30 ENCOUNTER — Telehealth: Payer: Self-pay | Admitting: *Deleted

## 2013-11-30 NOTE — Telephone Encounter (Signed)
Called patient with normal labs, no answer left message to call the office back for lab results.

## 2013-12-04 NOTE — Progress Notes (Signed)
Quick Note:  Tried to call patient her mail box was full I will send her a letter. ______

## 2013-12-04 NOTE — Telephone Encounter (Signed)
Patient returning the call, informed patient of normal labs, pt also wanted the status of her referral to Dr Thyra BreedMark Phillips informed patient that referral was completed on 11/29/13, patient stated that she will call Dr Vear ClockPhillips office.

## 2013-12-17 ENCOUNTER — Telehealth: Payer: Self-pay | Admitting: Neurology

## 2013-12-17 NOTE — Telephone Encounter (Signed)
Patient calling to state that her [facility] will take up to 3-5 months for her to get an appointment, wants to know if she can be referred to [facility], please return call and advise. Patient states that [facility] cannot take the referral electronically, it needs to be faxed to them.

## 2013-12-17 NOTE — Telephone Encounter (Signed)
[facility] order and [facility] to [facility].

## 2014-01-04 ENCOUNTER — Emergency Department (HOSPITAL_COMMUNITY)
Admission: EM | Admit: 2014-01-04 | Discharge: 2014-01-04 | Disposition: A | Discharge status: Home / Self Care | Payer: 59 | Attending: Emergency Medicine | Admitting: Emergency Medicine

## 2014-01-04 ENCOUNTER — Encounter (HOSPITAL_COMMUNITY): Payer: Self-pay | Admitting: Emergency Medicine

## 2014-01-04 DIAGNOSIS — F329 Major depressive disorder, single episode, unspecified: Secondary | ICD-10-CM | POA: Insufficient documentation

## 2014-01-04 DIAGNOSIS — F1910 Other psychoactive substance abuse, uncomplicated: Secondary | ICD-10-CM

## 2014-01-04 DIAGNOSIS — Z79899 Other long term (current) drug therapy: Secondary | ICD-10-CM | POA: Insufficient documentation

## 2014-01-04 DIAGNOSIS — F1010 Alcohol abuse, uncomplicated: Secondary | ICD-10-CM | POA: Insufficient documentation

## 2014-01-04 DIAGNOSIS — Z791 Long term (current) use of non-steroidal anti-inflammatories (NSAID): Secondary | ICD-10-CM | POA: Insufficient documentation

## 2014-01-04 DIAGNOSIS — G629 Polyneuropathy, unspecified: Secondary | ICD-10-CM | POA: Insufficient documentation

## 2014-01-04 DIAGNOSIS — I10 Essential (primary) hypertension: Secondary | ICD-10-CM | POA: Insufficient documentation

## 2014-01-04 DIAGNOSIS — G43909 Migraine, unspecified, not intractable, without status migrainosus: Secondary | ICD-10-CM | POA: Insufficient documentation

## 2014-01-04 DIAGNOSIS — E119 Type 2 diabetes mellitus without complications: Secondary | ICD-10-CM | POA: Insufficient documentation

## 2014-01-04 DIAGNOSIS — F419 Anxiety disorder, unspecified: Secondary | ICD-10-CM | POA: Insufficient documentation

## 2014-01-04 LAB — ETHANOL: Alcohol, Ethyl (B): 235 mg/dL — ABNORMAL HIGH (ref 0–11)

## 2014-01-04 LAB — CBC WITH DIFFERENTIAL/PLATELET
Basophils Absolute: 0.1 10*3/uL (ref 0.0–0.1)
Basophils Relative: 1 % (ref 0–1)
Eosinophils Absolute: 0.2 10*3/uL (ref 0.0–0.7)
Eosinophils Relative: 2 % (ref 0–5)
HCT: 43.3 % (ref 36.0–46.0)
HEMOGLOBIN: 15.1 g/dL — AB (ref 12.0–15.0)
LYMPHS ABS: 3.2 10*3/uL (ref 0.7–4.0)
Lymphocytes Relative: 34 % (ref 12–46)
MCH: 29.5 pg (ref 26.0–34.0)
MCHC: 34.9 g/dL (ref 30.0–36.0)
MCV: 84.6 fL (ref 78.0–100.0)
MONOS PCT: 6 % (ref 3–12)
Monocytes Absolute: 0.6 10*3/uL (ref 0.1–1.0)
NEUTROS ABS: 5.4 10*3/uL (ref 1.7–7.7)
Neutrophils Relative %: 57 % (ref 43–77)
Platelets: 279 10*3/uL (ref 150–400)
RBC: 5.12 MIL/uL — AB (ref 3.87–5.11)
RDW: 13.3 % (ref 11.5–15.5)
WBC: 9.4 10*3/uL (ref 4.0–10.5)

## 2014-01-04 LAB — COMPREHENSIVE METABOLIC PANEL
ALK PHOS: 163 U/L — AB (ref 39–117)
ALT: 42 U/L — ABNORMAL HIGH (ref 0–35)
ANION GAP: 15 (ref 5–15)
AST: 29 U/L (ref 0–37)
Albumin: 3.8 g/dL (ref 3.5–5.2)
BUN: 12 mg/dL (ref 6–23)
CHLORIDE: 107 meq/L (ref 96–112)
CO2: 24 meq/L (ref 19–32)
Calcium: 9.1 mg/dL (ref 8.4–10.5)
Creatinine, Ser: 0.67 mg/dL (ref 0.50–1.10)
GFR calc Af Amer: >90 mL/min (ref >90)
GFR calc non Af Amer: >90 mL/min (ref >90)
GLUCOSE: 104 mg/dL — AB (ref 70–99)
Potassium: 4.1 mEq/L (ref 3.7–5.3)
Sodium: 146 mEq/L (ref 137–147)
Total Protein: 7.8 g/dL (ref 6.0–8.3)

## 2014-01-04 LAB — URINE MICROSCOPIC-ADD ON

## 2014-01-04 LAB — URINALYSIS, ROUTINE W REFLEX MICROSCOPIC
BILIRUBIN URINE: NEGATIVE
Glucose, UA: NEGATIVE mg/dL
Ketones, ur: NEGATIVE mg/dL
Leukocytes, UA: NEGATIVE
Nitrite: NEGATIVE
PROTEIN: NEGATIVE mg/dL
Specific Gravity, Urine: <1.005 — ABNORMAL LOW (ref 1.005–1.030)
UROBILINOGEN UA: 0.2 mg/dL (ref 0.0–1.0)
pH: 5.5 (ref 5.0–8.0)

## 2014-01-04 LAB — RAPID URINE DRUG SCREEN, HOSP PERFORMED
Amphetamines: NOT DETECTED
BARBITURATES: NOT DETECTED
Benzodiazepines: NOT DETECTED
COCAINE: NOT DETECTED
Opiates: NOT DETECTED
TETRAHYDROCANNABINOL: NOT DETECTED

## 2014-01-04 MED ORDER — LORAZEPAM 1 MG PO TABS: 0.0000 mg | ORAL_TABLET | Freq: Two times a day (BID) | ORAL | Status: DC

## 2014-01-04 MED ORDER — ACETAMINOPHEN 325 MG PO TABS
650.0000 mg | ORAL_TABLET | ORAL | Status: DC | PRN
  Administered 2014-01-04: 650 mg via ORAL
  Filled 2014-01-04: qty 2

## 2014-01-04 MED ORDER — THIAMINE HCL 100 MG/ML IJ SOLN: 100.0000 mg | Freq: Every day | INTRAMUSCULAR | Status: DC

## 2014-01-04 MED ORDER — LORAZEPAM 1 MG PO TABS
0.0000 mg | ORAL_TABLET | Freq: Four times a day (QID) | ORAL | Status: DC
  Administered 2014-01-04: 1 mg via ORAL
  Filled 2014-01-04: qty 1

## 2014-01-04 MED ORDER — VITAMIN B-1 100 MG PO TABS
100.0000 mg | ORAL_TABLET | Freq: Every day | ORAL | Status: DC
  Administered 2014-01-04: 100 mg via ORAL
  Filled 2014-01-04: qty 1

## 2014-01-04 NOTE — ED Notes (Signed)
Requesting Detox from [facility]. Denies SI/HI

## 2014-01-04 NOTE — ED Notes (Signed)
Pt states she has been drinking more recently to avoid taking opiates d/t chronic ankle/foot pain. PT denies any SI/HI. PT is voluntary and seeking detox placement.
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
## 2014-01-04 NOTE — ED Notes (Signed)
Patient upset, agitated. Stating she is going to leave. MD Pickering notified.

## 2014-01-04 NOTE — ED Notes (Addendum)
Melba CoonWinnie, AC at bedside

## 2014-01-04 NOTE — ED Provider Notes (Signed)
CSN: [id number]     Arrival date & time 01/04/14  1040 History   First MD Initiated Contact with Patient 01/04/14 1334    This chart was scribed for No att. providers found by Marica Otter, ED Scribe. This patient was seen in room APA03/APA03 and the patient's care was started at 1:41 PM.  Chief Complaint  Patient presents with  . V70.1   The history is provided by the patient. No language interpreter was used.    PCP: Wilson Singer, MD HPI Comments: Bethany Barton is a 43 y.o. female, with medical Hx noted below and significant for  who presents to the Emergency Department for alcohol detox. Pt states that she drinks 2-3 glasses of wine every 2-3 days. Pt states that she tried outpatient treatment before, however, it was fruitless. Pt also reports that she discontinued use of opiates 2 months ago. Pt notes that she was started on opiates for her chronic ankle pain. Pt denies SI.    Past Medical History  Diagnosis Date  . Hypertension   . Peripheral neuropathy   . Facial tic   . Depression   . Diabetes mellitus without complication   . Polysubstance abuse     etoh, opiates  . Migraines   . Depression   . Anxiety    Past Surgical History  Procedure Laterality Date  . 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 Abdominal hysterectomy  2013  . Ankle fusion Right 1991  . Appendectomy  1987  . Cesarean section  1997  . Abdominoplasty  2013  . Cholecystectomy    . Incision and drainage abscess N/A 09/25/2013    Procedure: INCISION AND DRAINAGE ABSCESS;  Surgeon: Cherylynn Ridges, MD;  Location: [facility] OR;  Service: General;  Laterality: N/A;   Family History  Problem Relation Age of Onset  . Bipolar disorder Sister   . Drug abuse Sister   . Schizophrenia Son   . Bipolar disorder Father   . Alcohol abuse Father   . Drug abuse Mother   . Alcohol abuse Mother    History  Substance Use Topics  . Smoking status: Never Smoker   . Smokeless tobacco: Never Used  .  Alcohol Use: 4.8 oz/week    8 Glasses of wine per week     Comment: history of cocaine and narcotic dependence 07/25/13   OB History    No data available     Review of Systems  Musculoskeletal:       Chronic ankle pain at baseline  Psychiatric/Behavioral: Negative for suicidal ideas.  All other systems reviewed and are negative.  Allergies  Iodine; Neomycin; Nsaids; Phenergan; and Sulfa antibiotics  Home Medications   Prior to Admission medications   Medication Sig Start Date End Date Taking? Authorizing Provider  acetaminophen (TYLENOL) 325 MG tablet Take 650 mg by mouth every 6 (six) hours as needed (pain).    Yes Historical Provider, MD  Armodafinil (NUVIGIL) 200 MG TABS Take 200 mg by mouth daily. 11/12/13  Yes Levert Feinstein, MD  cloNIDine (CATAPRES) 0.1 MG tablet Take 0.1 mg by mouth 3 (three) times daily.   Yes Historical Provider, MD  cyclobenzaprine (FLEXERIL) 10 MG tablet Take 1 tablet (10 mg total) by mouth 2 (two) times daily as needed for muscle spasms. 11/13/13  Yes Hope Orlene Och, NP  DULoxetine (CYMBALTA) 60 MG capsule Take 1 capsule (60 mg total) by mouth daily. 11/12/13  Yes Levert Feinstein, MD  eletriptan (RELPAX) 40 MG tablet Take 1 tablet (40 mg total) by mouth as needed for migraine or headache. One tablet by mouth at onset of headache. May repeat in 2 hours if headache persists or recurs. 11/12/13  Yes Levert FeinsteinYijun Yan, MD  estradiol (CLIMARA - DOSED IN MG/24 HR) 0.075 mg/24hr patch Place 0.075 mg onto the skin once a week. Changes on Wednesday.   Yes Historical Provider, MD  gabapentin (NEURONTIN) 400 MG capsule Take 1 capsule (400 mg total) by mouth 6 (six) times daily. 11/12/13  Yes Levert FeinsteinYijun Yan, MD  lisinopril (PRINIVIL,ZESTRIL) 30 MG tablet Take 1 tablet (30 mg total) by mouth 2 (two) times daily. 07/25/13  Yes Beau FannyJohn C Withrow, FNP  Multiple Vitamin (MULTIVITAMIN WITH MINERALS) TABS tablet Take 1 tablet by mouth daily.   Yes Historical Provider, MD  naproxen sodium (ALEVE) 220 MG tablet  Take 220 mg by mouth 2 (two) times daily with a meal.   Yes Historical Provider, MD  nortriptyline (PAMELOR) 25 MG capsule 3 tabs po qhs xone week, then 4 tabs po qhs Patient taking differently: Take 100 mg by mouth at bedtime.  11/23/13  Yes Levert FeinsteinYijun Yan, MD  oxyCODONE (OXY IR/ROXICODONE) 5 MG immediate release tablet Take 10 mg by mouth at bedtime.   Yes Historical Provider, MD  progesterone (PROMETRIUM) 100 MG capsule Take 100 mg by mouth daily.   Yes Historical Provider, MD  rizatriptan (MAXALT) 10 MG tablet Take 10 mg by mouth as needed for migraine. May repeat in 2 hours if needed   Yes Historical Provider, MD  aspirin-acetaminophen-caffeine (EXCEDRIN MIGRAINE) [phone] MG per tablet Take 2 tablets by mouth every 6 (six) hours as needed for headache or migraine.    Historical Provider, MD  progesterone (ENDOMETRIN) 100 MG vaginal insert Place 100 mg vaginally 2 (two) times daily.    Historical Provider, MD   Triage Vitals: BP 125/92 mmHg  Pulse 94  Temp(Src) 98 F (36.7 C) (Oral)  Resp 18  Ht 5\' 4"  (1.626 m)  Wt 160 lb (72.576 kg)  BMI 27.45 kg/m2  SpO2 100% Physical Exam  Constitutional: She is oriented to person, place, and time. She appears well-developed and well-nourished.  Tearful   HENT:  Head: Normocephalic and atraumatic.  Eyes: Conjunctivae and EOM are normal.  Neck: Neck supple. No tracheal deviation present.  Cardiovascular: Normal rate.   Pulmonary/Chest: Effort normal. No respiratory distress.  Musculoskeletal: Normal range of motion.  Chronic post surgical scarring to the right ankle  Neurological: She is alert and oriented to person, place, and time.  Skin: Skin is warm and dry.  Psychiatric: She has a normal mood and affect. Her behavior is normal.  Nursing note and vitals reviewed.   ED Course  Procedures (including critical care time) DIAGNOSTIC STUDIES: Oxygen Saturation is 100% on ra, nl by my interpretation.       Labs Review Labs Reviewed  CBC  WITH DIFFERENTIAL - Abnormal; Notable for the following:    RBC 5.12 (*)    Hemoglobin 15.1 (*)    All other components within normal limits  COMPREHENSIVE METABOLIC PANEL - Abnormal; Notable for the following:    Glucose, Bld 104 (*)    ALT 42 (*)    Alkaline Phosphatase 163 (*)    Total Bilirubin <0.2 (*)    All other components within normal limits  ETHANOL - Abnormal; Notable for the following:    Alcohol, Ethyl (B) 235 (*)    All other components within normal limits  URINALYSIS, ROUTINE W REFLEX MICROSCOPIC - Abnormal; Notable for the following:    Specific Gravity, Urine <1.005 (*)    Hgb urine dipstick SMALL (*)    All other components within normal limits  URINE MICROSCOPIC-ADD ON - Abnormal; Notable for the following:    Squamous Epithelial / LPF FEW (*)    All other components within normal limits  URINE RAPID DRUG SCREEN (HOSP PERFORMED)    Imaging Review No results found.   EKG Interpretation None      MDM   Final diagnoses:  Substance abuse    Patient came requesting detox off Alcohol. States that she came off her Suboxone because she did not want to be on opiates around her heroin abusing son, but she still is on Percocet 10 mg every night. Denies suicidal thoughts. Appear to be medically cleared. She would drink a few beers 3 or 4 times a week. States after talking with [facility]. That she no longer wanted treatment for the alcohol and was just going to follow up with her doctor to get back on Suboxone.she was discharged  I personally performed the services described in this documentation, which was scribed in my presence. The recorded information has been reviewed and is accurate.     Juliet RudeNathan R. Rubin PayorPickering, MD 01/04/14 2149

## 2014-01-04 NOTE — ED Notes (Signed)
Patient changed into paper scrubs by NT. Belongings secured.

## 2014-01-04 NOTE — ED Notes (Signed)
Boyfriend, Joyice FasterJerry Coe, called stating [place] and then transferred to [facility] in [place] for 28 day program in May. Boyfriend states that she is a "binge drinker" and "does not follow through with outpatient treatment"

## 2014-01-04 NOTE — Discharge Instructions (Signed)
Alcohol Use Disorder Alcohol use disorder is a mental disorder. It is not a one-time incident of heavy drinking. Alcohol use disorder is the excessive and uncontrollable use of alcohol over time that leads to problems with functioning in one or more areas of daily living. People with this disorder risk harming themselves and others when they drink to excess. Alcohol use disorder also can cause other mental disorders, such as mood and anxiety disorders, and serious physical problems. People with alcohol use disorder often misuse other drugs.  Alcohol use disorder is common and widespread. Some people with this disorder drink alcohol to cope with or escape from negative life events. Others drink to relieve chronic pain or symptoms of mental illness. People with a family history of alcohol use disorder are at higher risk of losing control and using alcohol to excess.  SYMPTOMS  Signs and symptoms of alcohol use disorder may include the following:   Consumption ofalcohol inlarger amounts or over a longer period of time than intended.  Multiple unsuccessful attempts to cutdown or control alcohol use.   A great deal of time spent obtaining alcohol, using alcohol, or recovering from the effects of alcohol (hangover).  A strong desire or urge to use alcohol (cravings).   Continued use of alcohol despite problems at work, school, or home because of alcohol use.   Continued use of alcohol despite problems in relationships because of alcohol use.  Continued use of alcohol in situations when it is physically hazardous, such as driving a car.  Continued use of alcohol despite awareness of a physical or psychological problem that is likely related to alcohol use. Physical problems related to alcohol use can involve the brain, heart, liver, stomach, and intestines. Psychological problems related to alcohol use include intoxication, depression, anxiety, psychosis, delirium, and dementia.   The need for  increased amounts of alcohol to achieve the same desired effect, or a decreased effect from the consumption of the same amount of alcohol (tolerance).  Withdrawal symptoms upon reducing or stopping alcohol use, or alcohol use to reduce or avoid withdrawal symptoms. Withdrawal symptoms include:  Racing heart.  Hand tremor.  Difficulty sleeping.  Nausea.  Vomiting.  Hallucinations.  Restlessness.  Seizures. DIAGNOSIS Alcohol use disorder is diagnosed through an assessment by your health care provider. Your health care provider may start by asking three or four questions to screen for excessive or problematic alcohol use. To confirm a diagnosis of alcohol use disorder, at least two symptoms must be present within a 12-month period. The severity of alcohol use disorder depends on the number of symptoms:  Mild--two or three.  Moderate--four or five.  Severe--six or more. Your health care provider may perform a physical exam or use results from lab tests to see if you have physical problems resulting from alcohol use. Your health care provider may refer you to a mental health professional for evaluation. TREATMENT  Some people with alcohol use disorder are able to reduce their alcohol use to low-risk levels. Some people with alcohol use disorder need to quit drinking alcohol. When necessary, mental health professionals with specialized training in substance use treatment can help. Your health care provider can help you decide how severe your alcohol use disorder is and what type of treatment you need. The following forms of treatment are available:   Detoxification. Detoxification involves the use of prescription medicines to prevent alcohol withdrawal symptoms in the first week after quitting. This is important for people with a history of symptoms   of withdrawal and for heavy drinkers who are likely to have withdrawal symptoms. Alcohol withdrawal can be dangerous and, in severe cases, cause  death. Detoxification is usually provided in a hospital or in-patient substance use treatment facility.  Counseling or talk therapy. Talk therapy is provided by substance use treatment counselors. It addresses the reasons people use alcohol and ways to keep them from drinking again. The goals of talk therapy are to help people with alcohol use disorder find healthy activities and ways to cope with life stress, to identify and avoid triggers for alcohol use, and to handle cravings, which can cause relapse.  Medicines.Different medicines can help treat alcohol use disorder through the following actions:  Decrease alcohol cravings.  Decrease the positive reward response felt from alcohol use.  Produce an uncomfortable physical reaction when alcohol is used (aversion therapy).  Support groups. Support groups are run by people who have quit drinking. They provide emotional support, advice, and guidance. These forms of treatment are often combined. Some people with alcohol use disorder benefit from intensive combination treatment provided by specialized substance use treatment centers. Both inpatient and outpatient treatment programs are available. Document Released: 03/25/2004 Document Revised: 07/02/2013 Document Reviewed: 05/25/2012 ExitCare Patient Information 2015 [employer]. This information is not intended to replace advice given to you by your health care provider. Make sure you discuss any questions you have with your health care provider.  

## 2014-01-04 NOTE — ED Notes (Signed)
Requesting pain medication. RN informed patient that MD must evaluate her first. Verbal understanding obtained.

## 2014-01-04 NOTE — ED Notes (Signed)
Requesting pain medication. RN informed patient that MD evaluation must be completed first.

## 2014-02-11 ENCOUNTER — Ambulatory Visit: Payer: 59 | Admitting: Neurology

## 2014-02-11 ENCOUNTER — Encounter: Payer: Self-pay | Admitting: *Deleted

## 2014-05-15 ENCOUNTER — Other Ambulatory Visit: Payer: Self-pay | Admitting: Neurology

## 2014-05-15 MED ORDER — ARMODAFINIL 200 MG PO TABS: 200.0000 mg | ORAL_TABLET | Freq: Every day | ORAL | Status: DC

## 2014-05-15 NOTE — Telephone Encounter (Signed)
Pt is calling stating she needs prior authorization for Armodafinil (NUVIGIL) 200 MG TABS [facility]. She uses [facility] in [place].  Please advise.

## 2014-05-15 NOTE — Telephone Encounter (Signed)
I called pharmacy who said refill Berkley Harveyauth is what they are requesting, they do not see any other issues at this time.

## 2014-05-16 NOTE — Telephone Encounter (Signed)
Rx has been faxed.

## 2014-05-20 ENCOUNTER — Emergency Department (HOSPITAL_COMMUNITY)
Admission: EM | Admit: 2014-05-20 | Discharge: 2014-05-20 | Disposition: A | Discharge status: Home / Self Care | Payer: 59 | Attending: Emergency Medicine | Admitting: Emergency Medicine

## 2014-05-20 ENCOUNTER — Emergency Department (HOSPITAL_COMMUNITY): Payer: 59

## 2014-05-20 ENCOUNTER — Encounter (HOSPITAL_COMMUNITY): Payer: Self-pay | Admitting: Emergency Medicine

## 2014-05-20 DIAGNOSIS — Y9289 Other specified places as the place of occurrence of the external cause: Secondary | ICD-10-CM | POA: Diagnosis not present

## 2014-05-20 DIAGNOSIS — X58XXXA Exposure to other specified factors, initial encounter: Secondary | ICD-10-CM | POA: Diagnosis not present

## 2014-05-20 DIAGNOSIS — I10 Essential (primary) hypertension: Secondary | ICD-10-CM | POA: Diagnosis not present

## 2014-05-20 DIAGNOSIS — E119 Type 2 diabetes mellitus without complications: Secondary | ICD-10-CM | POA: Insufficient documentation

## 2014-05-20 DIAGNOSIS — F329 Major depressive disorder, single episode, unspecified: Secondary | ICD-10-CM | POA: Insufficient documentation

## 2014-05-20 DIAGNOSIS — S4992XA Unspecified injury of left shoulder and upper arm, initial encounter: Secondary | ICD-10-CM | POA: Diagnosis present

## 2014-05-20 DIAGNOSIS — Y998 Other external cause status: Secondary | ICD-10-CM | POA: Insufficient documentation

## 2014-05-20 DIAGNOSIS — Z8669 Personal history of other diseases of the nervous system and sense organs: Secondary | ICD-10-CM | POA: Diagnosis not present

## 2014-05-20 DIAGNOSIS — Z79899 Other long term (current) drug therapy: Secondary | ICD-10-CM | POA: Insufficient documentation

## 2014-05-20 DIAGNOSIS — F419 Anxiety disorder, unspecified: Secondary | ICD-10-CM | POA: Diagnosis not present

## 2014-05-20 DIAGNOSIS — Y9389 Activity, other specified: Secondary | ICD-10-CM | POA: Diagnosis not present

## 2014-05-20 MED ORDER — NAPROXEN 500 MG PO TABS: 500.0000 mg | ORAL_TABLET | Freq: Two times a day (BID) | ORAL | Status: DC

## 2014-05-20 MED ORDER — CYCLOBENZAPRINE HCL 10 MG PO TABS: 10.0000 mg | ORAL_TABLET | Freq: Three times a day (TID) | ORAL | Status: DC | PRN

## 2014-05-20 NOTE — ED Provider Notes (Signed)
CSN: [id number]     Arrival date & time 05/20/14  1612 History  This chart was scribed for Tammi Chayna Surratt, PA-C with Donnetta Hutching, MD by Tonye Royalty, ED Scribe. This patient was seen in room APFT22/APFT22 and the patient's care was started at 6:23 PM.    Chief Complaint  Patient presents with  . Shoulder Pain   The history is provided by the patient. No language interpreter was used.    HPI Comments: Bethany Barton is a 44 y.o. female who presents to the Emergency Department complaining of left shoulder pain after tripping and falling down stairs at 1400 today. She states her arm got caught on the railing and she twisted and overextended her shoulder. She states pain radiates toward her chest and shoulder blade. She denies striking her head, neck pain or LOC. She states she has not taken any medication or tried any other treatment. She states holding her arm across her chest improves the pain. She also denies headaches, dizziness, or numbness of the extremity.     Past Medical History  Diagnosis Date  . Hypertension   . Peripheral neuropathy   . Facial tic   . Depression   . Diabetes mellitus without complication   . Polysubstance abuse     etoh, opiates  . Migraines   . Depression   . Anxiety    Past Surgical History  Procedure Laterality Date  . 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 Abdominal hysterectomy  2013  . Ankle fusion Right 1991  . Appendectomy  1987  . Cesarean section  1997  . Abdominoplasty  2013  . Cholecystectomy    . Incision and drainage abscess N/A 09/25/2013    Procedure: INCISION AND DRAINAGE ABSCESS;  Surgeon: Cherylynn Ridges, MD;  Location: [facility] OR;  Service: General;  Laterality: N/A;   Family History  Problem Relation Age of Onset  . Bipolar disorder Sister   . Drug abuse Sister   . Schizophrenia Son   . Bipolar disorder Father   . Alcohol abuse Father   . Drug abuse Mother   . Alcohol abuse Mother    History  Substance Use  Topics  . Smoking status: Never Smoker   . Smokeless tobacco: Never Used  . Alcohol Use: 4.8 oz/week    8 Glasses of wine per week     Comment: history of cocaine and narcotic dependence 07/25/13   OB History    No data available     Review of Systems  Constitutional: Negative for fever and chills.  Eyes: Negative for visual disturbance.  Gastrointestinal: Negative for nausea and vomiting.  Musculoskeletal: Positive for arthralgias. Negative for joint swelling and neck pain.       Left shoulder pain  Skin: Negative for color change and wound.  Neurological: Negative for dizziness, syncope, numbness and headaches.  All other systems reviewed and are negative.     Allergies  Iodine; Neomycin; Nsaids; Phenergan; and Sulfa antibiotics  Home Medications   Prior to Admission medications   Medication Sig Start Date End Date Taking? Authorizing Provider  acetaminophen (TYLENOL) 325 MG tablet Take 650 mg by mouth every 6 (six) hours as needed (pain).     Historical Provider, MD  Armodafinil (NUVIGIL) 200 MG TABS Take 200 mg by mouth daily. 05/15/14   Levert Feinstein, MD  aspirin-acetaminophen-caffeine (EXCEDRIN MIGRAINE) [phone] MG per tablet Take 2 tablets by mouth every 6 (six) hours as needed for  headache or migraine.    Historical Provider, MD  cloNIDine (CATAPRES) 0.1 MG tablet Take 0.1 mg by mouth 3 (three) times daily.    Historical Provider, MD  cyclobenzaprine (FLEXERIL) 10 MG tablet Take 1 tablet (10 mg total) by mouth 2 (two) times daily as needed for muscle spasms. 11/13/13   Hope Orlene OchM Neese, NP  DULoxetine (CYMBALTA) 60 MG capsule Take 1 capsule (60 mg total) by mouth daily. 11/12/13   Levert FeinsteinYijun Yan, MD  eletriptan (RELPAX) 40 MG tablet Take 1 tablet (40 mg total) by mouth as needed for migraine or headache. One tablet by mouth at onset of headache. May repeat in 2 hours if headache persists or recurs. 11/12/13   Levert FeinsteinYijun Yan, MD  estradiol (CLIMARA - DOSED IN MG/24 HR) 0.075 mg/24hr patch  Place 0.075 mg onto the skin once a week. Changes on Wednesday.    Historical Provider, MD  gabapentin (NEURONTIN) 400 MG capsule Take 1 capsule (400 mg total) by mouth 6 (six) times daily. 11/12/13   Levert FeinsteinYijun Yan, MD  lisinopril (PRINIVIL,ZESTRIL) 30 MG tablet Take 1 tablet (30 mg total) by mouth 2 (two) times daily. 07/25/13   Beau FannyJohn C Withrow, FNP  Multiple Vitamin (MULTIVITAMIN WITH MINERALS) TABS tablet Take 1 tablet by mouth daily.    Historical Provider, MD  naproxen sodium (ALEVE) 220 MG tablet Take 220 mg by mouth 2 (two) times daily with a meal.    Historical Provider, MD  nortriptyline (PAMELOR) 25 MG capsule 3 tabs po qhs xone week, then 4 tabs po qhs Patient taking differently: Take 100 mg by mouth at bedtime.  11/23/13   Levert FeinsteinYijun Yan, MD  oxyCODONE (OXY IR/ROXICODONE) 5 MG immediate release tablet Take 10 mg by mouth at bedtime.    Historical Provider, MD  progesterone (ENDOMETRIN) 100 MG vaginal insert Place 100 mg vaginally 2 (two) times daily.    Historical Provider, MD  progesterone (PROMETRIUM) 100 MG capsule Take 100 mg by mouth daily.    Historical Provider, MD  rizatriptan (MAXALT) 10 MG tablet Take 10 mg by mouth as needed for migraine. May repeat in 2 hours if needed    Historical Provider, MD   BP 172/109 mmHg  Pulse 101  Temp(Src) 98 F (36.7 C) (Oral)  Resp 18  Ht 5\' 3"  (1.6 m)  Wt 173 lb (78.472 kg)  BMI 30.65 kg/m2  SpO2 100% Physical Exam  Constitutional: She is oriented to person, place, and time. She appears well-developed and well-nourished.  HENT:  Head: Normocephalic and atraumatic.  Eyes: EOM are normal. Pupils are equal, round, and reactive to light.  Neck: Normal range of motion. Neck supple. No spinous process tenderness and no muscular tenderness present.  Cardiovascular: Normal rate, regular rhythm, normal heart sounds and intact distal pulses.   No murmur heard. Pulmonary/Chest: Effort normal and breath sounds normal. No respiratory distress.   Musculoskeletal: Normal range of motion. She exhibits tenderness.  Diffuse tenderness to the anterior and posterior left shoulder No stepoff deformity or edema Radial pulse and distal sensation intact, compartments soft Patient guarding left arm in flexed position, pain with attempted abduction  Neurological: She is alert and oriented to person, place, and time.  Skin: Skin is warm and dry.  Psychiatric: She has a normal mood and affect.  Nursing note and vitals reviewed.   ED Course  Procedures (including critical care time)  DIAGNOSTIC STUDIES: Oxygen Saturation is 100% on room air, normal by my interpretation.    COORDINATION OF CARE: 6:31  PM Discussed treatment plan with patient at beside, including ice, anti-inflammatory, and sling. 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. She has pain medication at home  The patient agrees with the plan and has no further questions at this time.   Labs Review Labs Reviewed - No data to display  Imaging Review Dg Shoulder Left  05/20/2014   CLINICAL DATA:  Left shoulder pain with limited range of motion and tenderness since a fall down stairs.  EXAM: LEFT SHOULDER - 2+ VIEW  COMPARISON:  None.  FINDINGS: There is no evidence of fracture or dislocation. There is no evidence of arthropathy or other focal bone abnormality. Soft tissues are unremarkable.  IMPRESSION: Normal exam.   Electronically Signed   By: Francene Boyers M.D.   On: 05/20/2014 16:48     EKG Interpretation None      MDM  Pt reviewed on the [place] narcotics database, Patient received #[id number]  oxycodone on 05/09/14  Final diagnoses:  Shoulder injury, left, initial encounter    Pt with h/o mechanical fall that resulted in injury to left shoulder.  Remains NV intact, injury concerning for possible rotator cuff or labral injury.  Pt given sling for comfort, she agrees to limited wear and ROM exercises to avoid adhesive capsulitis.    Agrees to short course of NSAIDs, ice and close  ortho f/u.  Rx for flexeril also written at pt's request.  It was also noted that pt was hypertensive at d/c, she denies any associated sx's at present, states that her BP is poorly controlled and becomes elevated during stress or pain.  She was advised to have her BP re-evaluated by her PMD and to return if not improving.  She agrees to plan.  I personally performed the services described in this documentation, which was scribed in my presence. The recorded information has been reviewed and is accurate.   Severiano Gilbert, PA-C 05/21/14 1212  Donnetta Hutching, MD 05/22/14 1343

## 2014-05-20 NOTE — ED Notes (Signed)
Iva Lento. Triplett, PA made aware of pt's BP, reassessed by triplett as well, ok for discharge

## 2014-05-20 NOTE — ED Notes (Signed)
Pt reports was coming down the stairs and tripped. Pt reports left shoulder pain ever since. Limited rom and tenderness noted. Left radial pulses strong. cap refil <3 sec.

## 2014-07-20 ENCOUNTER — Emergency Department (HOSPITAL_COMMUNITY)
Admission: EM | Admit: 2014-07-20 | Discharge: 2014-07-20 | Disposition: A | Discharge status: Home / Self Care | Payer: 59 | Attending: Emergency Medicine | Admitting: Emergency Medicine

## 2014-07-20 ENCOUNTER — Encounter (HOSPITAL_COMMUNITY): Payer: Self-pay | Admitting: *Deleted

## 2014-07-20 DIAGNOSIS — G43909 Migraine, unspecified, not intractable, without status migrainosus: Secondary | ICD-10-CM | POA: Diagnosis not present

## 2014-07-20 DIAGNOSIS — M1990 Unspecified osteoarthritis, unspecified site: Secondary | ICD-10-CM | POA: Insufficient documentation

## 2014-07-20 DIAGNOSIS — Z79899 Other long term (current) drug therapy: Secondary | ICD-10-CM | POA: Insufficient documentation

## 2014-07-20 DIAGNOSIS — F329 Major depressive disorder, single episode, unspecified: Secondary | ICD-10-CM | POA: Insufficient documentation

## 2014-07-20 DIAGNOSIS — I10 Essential (primary) hypertension: Secondary | ICD-10-CM | POA: Insufficient documentation

## 2014-07-20 DIAGNOSIS — Z791 Long term (current) use of non-steroidal anti-inflammatories (NSAID): Secondary | ICD-10-CM | POA: Diagnosis not present

## 2014-07-20 DIAGNOSIS — F419 Anxiety disorder, unspecified: Secondary | ICD-10-CM | POA: Insufficient documentation

## 2014-07-20 DIAGNOSIS — M79671 Pain in right foot: Secondary | ICD-10-CM | POA: Diagnosis present

## 2014-07-20 DIAGNOSIS — G629 Polyneuropathy, unspecified: Secondary | ICD-10-CM | POA: Insufficient documentation

## 2014-07-20 DIAGNOSIS — E119 Type 2 diabetes mellitus without complications: Secondary | ICD-10-CM | POA: Diagnosis not present

## 2014-07-20 DIAGNOSIS — M79672 Pain in left foot: Secondary | ICD-10-CM | POA: Insufficient documentation

## 2014-07-20 DIAGNOSIS — M792 Neuralgia and neuritis, unspecified: Secondary | ICD-10-CM

## 2014-07-20 HISTORY — DX: Unspecified osteoarthritis, unspecified site: M19.90

## 2014-07-20 LAB — CBG MONITORING, ED: Glucose-Capillary: 89 mg/dL (ref 65–99)

## 2014-07-20 MED ORDER — OXYCODONE-ACETAMINOPHEN 5-325 MG PO TABS
1.0000 | ORAL_TABLET | Freq: Once | ORAL | Status: AC
  Administered 2014-07-20: 1 via ORAL
  Filled 2014-07-20: qty 1

## 2014-07-20 MED ORDER — HYDROCODONE-ACETAMINOPHEN 5-325 MG PO TABS: 1.0000 | ORAL_TABLET | ORAL | Status: DC | PRN

## 2014-07-20 MED ORDER — ONDANSETRON HCL 4 MG PO TABS
4.0000 mg | ORAL_TABLET | Freq: Once | ORAL | Status: AC
  Administered 2014-07-20: 4 mg via ORAL
  Filled 2014-07-20: qty 1

## 2014-07-20 MED ORDER — CYCLOBENZAPRINE HCL 10 MG PO TABS: 10.0000 mg | ORAL_TABLET | Freq: Two times a day (BID) | ORAL | Status: DC | PRN

## 2014-07-20 NOTE — ED Notes (Addendum)
Pain to both feet x 4 days. Pt states hx of neuropathy. States feet feel like they are on fire. Bottom of feet have reddened area present. Peeling to both feet also present.States spasms to toes on both feet. Pt states she she weaned herself off of oxycodone in late April. Taking Aleve for pain and neurontin, and nortriptyline

## 2014-07-20 NOTE — ED Notes (Signed)
Pt verbalized understanding of no driving and to use caution within 4 hours of taking pain meds due to meds cause drowsiness 

## 2014-07-20 NOTE — ED Provider Notes (Signed)
CSN: [id number]     Arrival date & time 07/20/14  1258 History   First MD Initiated Contact with Patient 07/20/14 1334     Chief Complaint  Patient presents with  . Foot Pain     (Consider location/radiation/quality/duration/timing/severity/associated sxs/prior Treatment) Patient is a 44 y.o. female presenting with lower extremity pain.  Foot Pain  Bethany Barton is a 44 y.o. female who presents to the ED with bilateral foot pain that started 4 days ago. Hx of neuropathy. Patient with hx of reconstruction surgery to right ankle and is scheduled for another surgery in July.   States she feels like her feet are on fire. She has peeling of the skin on the bottom of her feet. Complains of cramping/spasm pain. Patient states she weaned herself off of oxycodone in late April. Taking Aleve for pain along with Neurontin and nortriptyline.   Past Medical History  Diagnosis Date  . Hypertension   . Peripheral neuropathy   . Facial tic   . Depression   . Diabetes mellitus without complication   . Polysubstance abuse     etoh, opiates  . Migraines   . Depression   . Anxiety   . Osteoarthritis    Past Surgical History  Procedure Laterality Date  . 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 Abdominal hysterectomy  2013  . Ankle fusion Right 1991  . Appendectomy  1987  . Cesarean section  1997  . Abdominoplasty  2013  . Cholecystectomy    . Incision and drainage abscess N/A 09/25/2013    Procedure: INCISION AND DRAINAGE ABSCESS;  Surgeon: Cherylynn RidgesJames O [name], MD;  Location: [facility] OR;  Service: General;  Laterality: N/A;   Family History  Problem Relation Age of Onset  . Bipolar disorder Sister   . Drug abuse Sister   . Schizophrenia Son   . Bipolar disorder Father   . Alcohol abuse Father   . Drug abuse Mother   . Alcohol abuse Mother    History  Substance Use Topics  . Smoking status: Never Smoker   . Smokeless tobacco: Never Used  . Alcohol Use: No     Comment:  Denies alcohol since last June. /history of cocaine and narcotic dependence 07/25/13   OB History    No data available     Review of Systems Negative except as stated in HPI   Allergies  Iodine; Neomycin; Nsaids; Phenergan; and Sulfa antibiotics  Home Medications   Prior to Admission medications   Medication Sig Start Date End Date Taking? Authorizing Provider  acetaminophen (TYLENOL) 325 MG tablet Take 650 mg by mouth every 6 (six) hours as needed (pain).    Yes Historical Provider, MD  amLODipine (NORVASC) 10 MG tablet Take 10 mg by mouth daily.   Yes Historical Provider, MD  Armodafinil (NUVIGIL) 200 MG TABS Take 200 mg by mouth daily. 05/15/14  Yes Levert FeinsteinYijun Yan, MD  aspirin-acetaminophen-caffeine (EXCEDRIN MIGRAINE) [phone] MG per tablet Take 2 tablets by mouth every 6 (six) hours as needed for headache or migraine.   Yes Historical Provider, MD  cloNIDine (CATAPRES) 0.1 MG tablet Take 0.1 mg by mouth 2 (two) times daily.    Yes Historical Provider, MD  DULoxetine (CYMBALTA) 60 MG capsule Take 1 capsule (60 mg total) by mouth daily. 11/12/13  Yes Levert FeinsteinYijun Yan, MD  eletriptan (RELPAX) 40 MG tablet Take 1 tablet (40 mg total) by mouth as needed for migraine or headache. One tablet  by mouth at onset of headache. May repeat in 2 hours if headache persists or recurs. 11/12/13  Yes Levert Feinstein, MD  gabapentin (NEURONTIN) 400 MG capsule Take 1 capsule (400 mg total) by mouth 6 (six) times daily. Patient taking differently: Take 800 mg by mouth 4 (four) times daily.  11/12/13  Yes Levert Feinstein, MD  lisinopril (PRINIVIL,ZESTRIL) 20 MG tablet Take 20 mg by mouth 2 (two) times daily.   Yes Historical Provider, MD  Multiple Vitamin (MULTIVITAMIN WITH MINERALS) TABS tablet Take 1 tablet by mouth daily.   Yes Historical Provider, MD  naproxen sodium (ALEVE) 220 MG tablet Take 220 mg by mouth 2 (two) times daily with a meal.   Yes Historical Provider, MD  nortriptyline (PAMELOR) 25 MG capsule 3 tabs po qhs xone  week, then 4 tabs po qhs Patient taking differently: Take 150 mg by mouth at bedtime.  11/23/13  Yes Levert Feinstein, MD  cyclobenzaprine (FLEXERIL) 10 MG tablet Take 1 tablet (10 mg total) by mouth 2 (two) times daily as needed for muscle spasms. 07/20/14   Hope Orlene Och, NP  HYDROcodone-acetaminophen (NORCO/VICODIN) 5-325 MG per tablet Take 1 tablet by mouth every 4 (four) hours as needed. 07/20/14   Hope Orlene Och, NP  lisinopril (PRINIVIL,ZESTRIL) 30 MG tablet Take 1 tablet (30 mg total) by mouth 2 (two) times daily. Patient not taking: Reported on 07/20/2014 07/25/13   Beau Fanny, FNP  naproxen (NAPROSYN) 500 MG tablet Take 1 tablet (500 mg total) by mouth 2 (two) times daily with a meal. Patient not taking: Reported on 07/20/2014 05/20/14   Tammy Triplett, PA-C   BP 171/99 mmHg  Pulse 88  Temp(Src) 97.8 F (36.6 C) (Oral)  Resp 18  Ht  (1.6 m)  Wt 170 lb (77.111 kg)  BMI 30.12 kg/m2  SpO2 100% Physical Exam  Constitutional: She is oriented to person, place, and time. She appears well-developed and well-nourished.  HENT:  Head: Normocephalic.  Eyes: EOM are normal.  Neck: Neck supple.  Cardiovascular: Normal rate.   Pulmonary/Chest: Effort normal.  Abdominal: Soft. There is no tenderness.  Musculoskeletal: Normal range of motion.  Pain to bilateral feet plantar aspect and peeling of the skin noted. Pedal pulses 2+ bilateral, adequate circulation.   Neurological: She is alert and oriented to person, place, and time. No cranial nerve deficit.  Skin: Skin is warm and dry.  Psychiatric: She has a normal mood and affect. Her behavior is normal.  Nursing note and vitals reviewed.   ED Course  Procedures (including critical care time) Labs Review Labs Reviewed  CBG MONITORING, ED    MDM  44 y.o. female with bilateral foot pain and hx of neuropathic pain and chronic right ankle pain s/p injury and surgery. Stable for d/c without neurovascular compromise. Stopped her Percocet one  month ago. Will treat for pain and she will follow up with her PCP on Monday for pain management.   Final diagnoses:  Neuropathic pain       Janne Napoleon, NP 07/20/14 1725  Bethann Berkshire, MD 07/21/14 [phone]

## 2014-07-20 NOTE — Discharge Instructions (Signed)
Call your doctor on Monday to discuss pain management.

## 2014-08-06 ENCOUNTER — Emergency Department (HOSPITAL_COMMUNITY)
Admission: EM | Admit: 2014-08-06 | Discharge: 2014-08-06 | Disposition: A | Discharge status: Home / Self Care | Payer: 59 | Attending: Emergency Medicine | Admitting: Emergency Medicine

## 2014-08-06 ENCOUNTER — Encounter (HOSPITAL_COMMUNITY): Payer: Self-pay | Admitting: Physical Medicine and Rehabilitation

## 2014-08-06 DIAGNOSIS — F329 Major depressive disorder, single episode, unspecified: Secondary | ICD-10-CM | POA: Diagnosis not present

## 2014-08-06 DIAGNOSIS — Z7982 Long term (current) use of aspirin: Secondary | ICD-10-CM | POA: Insufficient documentation

## 2014-08-06 DIAGNOSIS — E119 Type 2 diabetes mellitus without complications: Secondary | ICD-10-CM | POA: Insufficient documentation

## 2014-08-06 DIAGNOSIS — I10 Essential (primary) hypertension: Secondary | ICD-10-CM | POA: Diagnosis not present

## 2014-08-06 DIAGNOSIS — F419 Anxiety disorder, unspecified: Secondary | ICD-10-CM | POA: Insufficient documentation

## 2014-08-06 DIAGNOSIS — M1990 Unspecified osteoarthritis, unspecified site: Secondary | ICD-10-CM | POA: Diagnosis not present

## 2014-08-06 DIAGNOSIS — Z791 Long term (current) use of non-steroidal anti-inflammatories (NSAID): Secondary | ICD-10-CM | POA: Diagnosis not present

## 2014-08-06 DIAGNOSIS — Z79899 Other long term (current) drug therapy: Secondary | ICD-10-CM | POA: Diagnosis not present

## 2014-08-06 DIAGNOSIS — M79671 Pain in right foot: Secondary | ICD-10-CM | POA: Diagnosis present

## 2014-08-06 DIAGNOSIS — L853 Xerosis cutis: Secondary | ICD-10-CM | POA: Diagnosis not present

## 2014-08-06 DIAGNOSIS — G43909 Migraine, unspecified, not intractable, without status migrainosus: Secondary | ICD-10-CM | POA: Diagnosis not present

## 2014-08-06 MED ORDER — TRAMADOL HCL 50 MG PO TABS: 50.0000 mg | ORAL_TABLET | Freq: Four times a day (QID) | ORAL | Status: DC | PRN

## 2014-08-06 NOTE — ED Provider Notes (Signed)
CSN: [id number]     Arrival date & time 08/06/14  1709 History  This chart was scribed for non-physician practitioner, Felicie Mornavid Layci Stenglein, NP working with Derwood KaplanAnkit Nanavati, MD by Gwenyth Oberatherine Macek, ED scribe. This patient was seen in room TR11C/TR11C and the patient's care was started at 6:27 PM   Chief Complaint  Patient presents with  . Foot Pain   The history is provided by the patient. No language interpreter was used.   HPI Comments: Vertell LimberBrandy H Canipe is a 44 y.o. female with a history of HTN, DM, osteoarthritis and neuropathy, who presents to the Emergency Department complaining of constant, gradually worsening, mild right foot pain that started several weeks ago. Pt reports a small open wound to the bottom of her foot, secondary to the dryness, as an associated symptom. She has tried applying moisturizer and Crisco with no relief. Pt was seen on 5/21 for bilateral foot pain and was prescribed Naprosyn, which caused nausea. She has been unable to follow-up with her neurologist. Pt has tried Mobic and Celebrex previously with no relief. She denies fever.  Past Medical History  Diagnosis Date  . Hypertension   . Peripheral neuropathy   . Facial tic   . Depression   . Diabetes mellitus without complication   . Polysubstance abuse     etoh, opiates  . Migraines   . Depression   . Anxiety   . Osteoarthritis    Past Surgical History  Procedure Laterality Date  . 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 Abdominal hysterectomy  2013  . Ankle fusion Right 1991  . Appendectomy  1987  . Cesarean section  1997  . Abdominoplasty  2013  . Cholecystectomy    . Incision and drainage abscess N/A 09/25/2013    Procedure: INCISION AND DRAINAGE ABSCESS;  Surgeon: Cherylynn RidgesJames O [name], MD;  Location: [facility] OR;  Service: General;  Laterality: N/A;   Family History  Problem Relation Age of Onset  . Bipolar disorder Sister   . Drug abuse Sister   . Schizophrenia Son   . Bipolar disorder Father   .  Alcohol abuse Father   . Drug abuse Mother   . Alcohol abuse Mother    History  Substance Use Topics  . Smoking status: Never Smoker   . Smokeless tobacco: Never Used  . Alcohol Use: No     Comment: Denies alcohol since last June. /history of cocaine and narcotic dependence 07/25/13   OB History    No data available     Review of Systems  Constitutional: Negative for fever.  Musculoskeletal: Positive for arthralgias.  Skin: Positive for wound. Negative for color change.  All other systems reviewed and are negative.     Allergies  Iodine; Neomycin; Nsaids; Phenergan; and Sulfa antibiotics  Home Medications   Prior to Admission medications   Medication Sig Start Date End Date Taking? Authorizing Provider  acetaminophen (TYLENOL) 325 MG tablet Take 650 mg by mouth every 6 (six) hours as needed (pain).     Historical Provider, MD  amLODipine (NORVASC) 10 MG tablet Take 10 mg by mouth daily.    Historical Provider, MD  Armodafinil (NUVIGIL) 200 MG TABS Take 200 mg by mouth daily. 05/15/14   Levert FeinsteinYijun Yan, MD  aspirin-acetaminophen-caffeine (EXCEDRIN MIGRAINE) [id number] MG per tablet Take 2 tablets by mouth every 6 (six) hours as needed for headache or migraine.    Historical Provider, MD  cloNIDine (CATAPRES) 0.1 MG tablet Take  0.1 mg by mouth 2 (two) times daily.     Historical Provider, MD  cyclobenzaprine (FLEXERIL) 10 MG tablet Take 1 tablet (10 mg total) by mouth 2 (two) times daily as needed for muscle spasms. 07/20/14   Hope Orlene Och, NP  DULoxetine (CYMBALTA) 60 MG capsule Take 1 capsule (60 mg total) by mouth daily. 11/12/13   Levert Feinstein, MD  eletriptan (RELPAX) 40 MG tablet Take 1 tablet (40 mg total) by mouth as needed for migraine or headache. One tablet by mouth at onset of headache. May repeat in 2 hours if headache persists or recurs. 11/12/13   Levert Feinstein, MD  gabapentin (NEURONTIN) 400 MG capsule Take 1 capsule (400 mg total) by mouth 6 (six) times daily. Patient taking  differently: Take 800 mg by mouth 4 (four) times daily.  11/12/13   Levert Feinstein, MD  HYDROcodone-acetaminophen (NORCO/VICODIN) 5-325 MG per tablet Take 1 tablet by mouth every 4 (four) hours as needed. 07/20/14   Hope Orlene Och, NP  lisinopril (PRINIVIL,ZESTRIL) 20 MG tablet Take 20 mg by mouth 2 (two) times daily.    Historical Provider, MD  lisinopril (PRINIVIL,ZESTRIL) 30 MG tablet Take 1 tablet (30 mg total) by mouth 2 (two) times daily. Patient not taking: Reported on 07/20/2014 07/25/13   Beau Fanny, FNP  Multiple Vitamin (MULTIVITAMIN WITH MINERALS) TABS tablet Take 1 tablet by mouth daily.    Historical Provider, MD  naproxen (NAPROSYN) 500 MG tablet Take 1 tablet (500 mg total) by mouth 2 (two) times daily with a meal. Patient not taking: Reported on 07/20/2014 05/20/14   Tammy Triplett, PA-C  naproxen sodium (ALEVE) 220 MG tablet Take 220 mg by mouth 2 (two) times daily with a meal.    Historical Provider, MD  nortriptyline (PAMELOR) 25 MG capsule 3 tabs po qhs xone week, then 4 tabs po qhs Patient taking differently: Take 150 mg by mouth at bedtime.  11/23/13   Levert Feinstein, MD   BP 158/106 mmHg  Pulse 104  Temp(Src) 98.2 F (36.8 C) (Oral)  Resp 20  SpO2 100% Physical Exam  Constitutional: She appears well-developed and well-nourished. No distress.  HENT:  Head: Normocephalic and atraumatic.  Eyes: Conjunctivae and EOM are normal.  Neck: Neck supple. No tracheal deviation present.  Cardiovascular: Normal rate.   Pulmonary/Chest: Effort normal. No respiratory distress.  Skin: Skin is warm and dry.  Dryness on the bottom of her feet with a small, superficial crack on the bottom of her right foot with no active bleeding; no erythema or increased warmth  Psychiatric: She has a normal mood and affect. Her behavior is normal.  Nursing note and vitals reviewed.   ED Course  Procedures   DIAGNOSTIC STUDIES: Oxygen Saturation is 100% on RA, normal by my interpretation.    COORDINATION  OF CARE: 6:34 PM Discussed treatment plan with pt which includes Tramadol, Crisco with Saran Wrap and utter cream. Pt agreed to plan.   Labs Review Labs Reviewed - No data to display  Imaging Review No results found.   EKG Interpretation None      MDM   Final diagnoses:  None    Xerosis of feet. No indication of infection. Moisturizing cream. Tramadol for pain.  I personally performed the services described in this documentation, which was scribed in my presence. The recorded information has been reviewed and is accurate.    Felicie Morn, NP 08/07/14 0021  Derwood Kaplan, MD 08/08/14 1610

## 2014-08-06 NOTE — ED Notes (Signed)
Pt reports R pain. States dry skin and open wound to bottom of R foot. Ongoing for several weeks. History of neuropathy and osteoarthritis. NAD

## 2014-08-06 NOTE — Discharge Instructions (Signed)
Apply moisturizing cream to feet as discussed.  [facility]

## 2014-09-30 ENCOUNTER — Emergency Department (HOSPITAL_COMMUNITY): Payer: 59

## 2014-09-30 ENCOUNTER — Emergency Department (HOSPITAL_COMMUNITY)
Admission: EM | Admit: 2014-09-30 | Discharge: 2014-09-30 | Disposition: A | Discharge status: Home / Self Care | Payer: 59 | Attending: Emergency Medicine | Admitting: Emergency Medicine

## 2014-09-30 ENCOUNTER — Encounter (HOSPITAL_COMMUNITY): Payer: Self-pay | Admitting: Emergency Medicine

## 2014-09-30 DIAGNOSIS — F419 Anxiety disorder, unspecified: Secondary | ICD-10-CM | POA: Insufficient documentation

## 2014-09-30 DIAGNOSIS — R112 Nausea with vomiting, unspecified: Secondary | ICD-10-CM

## 2014-09-30 DIAGNOSIS — I10 Essential (primary) hypertension: Secondary | ICD-10-CM | POA: Diagnosis not present

## 2014-09-30 DIAGNOSIS — G43909 Migraine, unspecified, not intractable, without status migrainosus: Secondary | ICD-10-CM | POA: Diagnosis not present

## 2014-09-30 DIAGNOSIS — G629 Polyneuropathy, unspecified: Secondary | ICD-10-CM | POA: Insufficient documentation

## 2014-09-30 DIAGNOSIS — F329 Major depressive disorder, single episode, unspecified: Secondary | ICD-10-CM | POA: Insufficient documentation

## 2014-09-30 DIAGNOSIS — E876 Hypokalemia: Secondary | ICD-10-CM | POA: Diagnosis not present

## 2014-09-30 DIAGNOSIS — Z79899 Other long term (current) drug therapy: Secondary | ICD-10-CM | POA: Diagnosis not present

## 2014-09-30 DIAGNOSIS — E119 Type 2 diabetes mellitus without complications: Secondary | ICD-10-CM | POA: Diagnosis not present

## 2014-09-30 DIAGNOSIS — Z791 Long term (current) use of non-steroidal anti-inflammatories (NSAID): Secondary | ICD-10-CM | POA: Insufficient documentation

## 2014-09-30 DIAGNOSIS — M1990 Unspecified osteoarthritis, unspecified site: Secondary | ICD-10-CM | POA: Diagnosis not present

## 2014-09-30 HISTORY — DX: Unspecified mononeuropathy of bilateral lower limbs: G57.93

## 2014-09-30 LAB — CBC WITH DIFFERENTIAL/PLATELET
Basophils Absolute: 0.1 10*3/uL (ref 0.0–0.1)
Basophils Relative: 0 % (ref 0–1)
EOS ABS: 0.3 10*3/uL (ref 0.0–0.7)
Eosinophils Relative: 3 % (ref 0–5)
HCT: 44.8 % (ref 36.0–46.0)
Hemoglobin: 15.8 g/dL — ABNORMAL HIGH (ref 12.0–15.0)
LYMPHS ABS: 3.3 10*3/uL (ref 0.7–4.0)
LYMPHS PCT: 27 % (ref 12–46)
MCH: 30.4 pg (ref 26.0–34.0)
MCHC: 35.3 g/dL (ref 30.0–36.0)
MCV: 86.2 fL (ref 78.0–100.0)
Monocytes Absolute: 0.7 10*3/uL (ref 0.1–1.0)
Monocytes Relative: 6 % (ref 3–12)
NEUTROS ABS: 7.9 10*3/uL — AB (ref 1.7–7.7)
Neutrophils Relative %: 64 % (ref 43–77)
Platelets: 329 10*3/uL (ref 150–400)
RBC: 5.2 MIL/uL — AB (ref 3.87–5.11)
RDW: 12.5 % (ref 11.5–15.5)
WBC: 12.3 10*3/uL — ABNORMAL HIGH (ref 4.0–10.5)

## 2014-09-30 LAB — COMPREHENSIVE METABOLIC PANEL
ALBUMIN: 4.6 g/dL (ref 3.5–5.0)
ALT: 28 U/L (ref 14–54)
ANION GAP: 13 (ref 5–15)
AST: 29 U/L (ref 15–41)
Alkaline Phosphatase: 112 U/L (ref 38–126)
BUN: 17 mg/dL (ref 6–20)
CALCIUM: 9.6 mg/dL (ref 8.9–10.3)
CO2: 29 mmol/L (ref 22–32)
CREATININE: 0.71 mg/dL (ref 0.44–1.00)
Chloride: 97 mmol/L — ABNORMAL LOW (ref 101–111)
GFR calc Af Amer: >60 mL/min (ref >60)
GFR calc non Af Amer: >60 mL/min (ref >60)
GLUCOSE: 111 mg/dL — AB (ref 65–99)
Potassium: 2.8 mmol/L — ABNORMAL LOW (ref 3.5–5.1)
Sodium: 139 mmol/L (ref 135–145)
Total Bilirubin: 0.2 mg/dL — ABNORMAL LOW (ref 0.3–1.2)
Total Protein: 8.3 g/dL — ABNORMAL HIGH (ref 6.5–8.1)

## 2014-09-30 LAB — URINALYSIS, ROUTINE W REFLEX MICROSCOPIC
Bilirubin Urine: NEGATIVE
Glucose, UA: NEGATIVE mg/dL
KETONES UR: NEGATIVE mg/dL
LEUKOCYTES UA: NEGATIVE
Nitrite: NEGATIVE
PROTEIN: NEGATIVE mg/dL
SPECIFIC GRAVITY, URINE: 1.02 (ref 1.005–1.030)
Urobilinogen, UA: 0.2 mg/dL (ref 0.0–1.0)
pH: 6 (ref 5.0–8.0)

## 2014-09-30 LAB — URINE MICROSCOPIC-ADD ON

## 2014-09-30 LAB — LIPASE, BLOOD: LIPASE: 21 U/L — AB (ref 22–51)

## 2014-09-30 MED ORDER — ONDANSETRON 8 MG PO TBDP
8.0000 mg | ORAL_TABLET | Freq: Once | ORAL | Status: AC
  Administered 2014-09-30: 8 mg via ORAL
  Filled 2014-09-30: qty 1

## 2014-09-30 MED ORDER — SODIUM CHLORIDE 0.9 % IV BOLUS (SEPSIS)
1000.0000 mL | Freq: Once | INTRAVENOUS | Status: AC
  Administered 2014-09-30: 1000 mL via INTRAVENOUS

## 2014-09-30 MED ORDER — METOCLOPRAMIDE HCL 10 MG PO TABS: 10.0000 mg | ORAL_TABLET | Freq: Four times a day (QID) | ORAL | Status: DC | PRN

## 2014-09-30 MED ORDER — POTASSIUM CHLORIDE CRYS ER 20 MEQ PO TBCR
40.0000 meq | EXTENDED_RELEASE_TABLET | Freq: Once | ORAL | Status: AC
  Administered 2014-09-30: 40 meq via ORAL
  Filled 2014-09-30: qty 2

## 2014-09-30 MED ORDER — METOCLOPRAMIDE HCL 5 MG/ML IJ SOLN
10.0000 mg | Freq: Once | INTRAMUSCULAR | Status: AC
  Administered 2014-09-30: 10 mg via INTRAVENOUS
  Filled 2014-09-30: qty 2

## 2014-09-30 MED ORDER — POTASSIUM CHLORIDE 10 MEQ/100ML IV SOLN
10.0000 meq | Freq: Once | INTRAVENOUS | Status: AC
  Administered 2014-09-30: 10 meq via INTRAVENOUS
  Filled 2014-09-30: qty 100

## 2014-09-30 NOTE — ED Notes (Signed)
Pt states understanding of care given and follow up instructions.  Able to drink ginger ale without vomiting.  Ambulated out of ED

## 2014-09-30 NOTE — ED Notes (Addendum)
Patient reports vomiting since Thursday. States she was started on antibiotics a week ago for cellulitis and started having nausea since. Denies diarrhea.

## 2014-09-30 NOTE — Discharge Instructions (Signed)
°Emergency Department Resource Guide °1) Find a Doctor and Pay Out of Pocket °Although you won't have to find out who is covered by your insurance plan, it is a good idea to ask around and get recommendations. You will then need to call the office and see if the doctor you have chosen will accept you as a new patient and what types of options they offer for patients who are self-pay. Some doctors offer discounts or will set up payment plans for their patients who do not have insurance, but you will need to ask so you aren't surprised when you get to your appointment. ° °2) Contact Your Local Health Department °Not all health departments have doctors that can see patients for sick visits, but many do, so it is worth a call to see if yours does. If you don't know where your local health department is, you can check in your phone book. The CDC also has a tool to help you locate your state's health department, and many state websites also have listings of all of their local health departments. ° °3) Find a Walk-in Clinic °If your illness is not likely to be very severe or complicated, you may want to try a walk in clinic. These are popping up all over the country in pharmacies, drugstores, and shopping centers. They're usually staffed by nurse practitioners or physician assistants that have been trained to treat common illnesses and complaints. They're usually fairly quick and inexpensive. However, if you have serious medical issues or chronic medical problems, these are probably not your best option. ° °No Primary Care Doctor: °- Call [facility] at  832-8000 - they can help you locate a primary care doctor that  accepts your insurance, provides certain services, etc. °- Physician Referral Service- 1-800-533-3463 ° °Chronic Pain Problems: °Organization         Address  Phone   Notes  °[facility]  (336) [phone] Patients need to be referred by their primary care doctor.  ° °Medication  Assistance: °Organization         Address  Phone   Notes  °[place] [facility] [address] °[place], [place] [address] (336) 641-8030 --Must be a resident of [place] °-- Must have NO insurance coverage whatsoever (no Medicaid/ Medicare, etc.) °-- The pt. MUST have a primary care doctor that directs their care regularly and follows them in the community °  °[facility]  (866) 331-1348   °[employer]  (888) 892-1162   ° °Agencies that provide inexpensive medical care: °Organization         Address  Phone   Notes  °[facility]  (336) 832-8035   °[facility]    (336) 832-7272   °[facility] [address] °[place], [place] [address] (336) 832-4777   °[facility] [address], °[place] (336) 271-4999   °[facility]    (336) 373-0678   °[facility]    (336) 272-1050   °[facility] ° [address], [place] Phone:  (336) 832-4444, Fax:  (336) 832-4440 Hours of Operation:  9 am - 6 pm, M-F.  Also accepts Medicaid/Medicare and self-pay.  °[facility] ° [address], [place] Phone: (336) [phone], Fax: (336) [phone]. Hours of Operation:  8:30 am - 5:30 pm, M-F.  Also accepts Medicaid and self-pay.  °[facility] [place] [address], [place] Phone: (336) 878-6027   °[facility] [address], [place], [place] (336)723-1848, Ext. 123 Mondays & Thursdays: 7-9 AM.  First 15 patients are seen on a first come, first serve basis. °  ° °Medicaid-accepting [place] Providers: ° °Organization         Address  Phone   Notes  °[facility] [address], [place] (336) 641-2100 Also accepts self-pay patients.  °[facility] [address], [place] ° (336) 856-9996   °[facility] [address], [place]  (336) 288-8857   °[facility] [address] [place] [address], [place] (336) 299-7000   °Veita Bland [address], [place]  ° (336) 373-1557 Only accepts [place] Access Medicaid patients after they have their name applied to their card.  ° °Self-Pay (no insurance) in [place]: ° °Organization         Address  Phone   Notes  °Sickle Cell Patients, [facility] [address], [place] (336) 832-1970   °[facility] [address], [place] (336) 832-4400   °[facility] ° [address], [place] (336) 992-4800   °[facility]/Dr. Osei-Bonsu ° [address] [place] [address], [place] or [address], [place] (336) [phone] Phone number for both [place] and [place] locations is the same.  °[facility] [address], [place] (336) 299-0000   °[facility] [address] [place] [address], [place] or [address] (336) 852-7530 °(336) 878-2260   °[facility] [address], [place] (336) 350-1642, phone; (336) 294-5005, fax Sees patients 1st and 3rd Saturday of every month.  Must not qualify for public or private insurance (i.e. Medicaid, Medicare, [place] Health Choice, Veterans' Benefits) • Household income should be no more than 200% of the poverty level •The clinic cannot treat you if you are pregnant or think you are pregnant • Sexually transmitted diseases are not treated at the clinic.  ° ° °Dental Care: °Organization         Address  Phone  Notes  °[place] [facility] [address], [place] (336) 641-6152 Accepts children up to age 21 who are enrolled in Medicaid or [place] Health Choice; pregnant women with a Medicaid card; and children who have applied for Medicaid or [place] Health Choice, but were declined, whose parents can pay a reduced fee at time of service.  °[place]  [facility] [place]  [address], [place] (336) 641-7733 Accepts children up to age 21 who are enrolled in Medicaid or [place] Health Choice; pregnant women with a Medicaid card; and children who have applied for Medicaid or [place] Health Choice, but were declined, whose parents can pay a reduced fee at time of service.  °[facility] ° [address], [place] (336) 641-4533 Patients are seen by appointment only. Walk-ins are not accepted. [facility] will see patients 18 years of age and older. °Monday - Tuesday (8am-5pm) °Most Wednesdays (8:30-5pm) °$30 per visit, cash only  °[facility] ° [address], [place] (336) 641-4533 Patients are seen by appointment only. Walk-ins are not accepted. [facility] will see patients 18 years of age and older. °One   Wednesday Evening (Monthly: Volunteer Based).  $30 per visit, cash only  °[facility]  (919) 537-3737 for adults; Children under age 4, call Graduate Pediatric Dentistry at ([phone]) [phone]. Children aged 4-14, please call ([phone]) [phone] to request a pediatric application. ° Dental services are provided in all areas of dental care including fillings, crowns and bridges, complete and partial dentures, implants, gum treatment, root canals, and extractions. Preventive care is also provided. Treatment is provided to both adults and children. °Patients are selected via a lottery and there is often a waiting list. °  °[facility] [address], °[place] ° (336) 763-8833 [email or web address] °  °[facility] [address], [place], [place] (336)723-1848, Ext. 123 Second and Fourth Thursday of each month, opens at 6:30 AM; Clinic ends at 9 AM.  Patients are seen on a first-come first-served basis, and a limited number are seen during each clinic.  ° °[facility] ° [address], [place], [place] (336) [phone]    Eligibility Requirements °You must have lived in [place], [place], or [place] counties for at least the last three months. °  You cannot be eligible for state or federal sponsored healthcare insurance, including Veterans Administration, Medicaid, or Medicare. °  You generally cannot be eligible for healthcare insurance through your employer.  °  How to apply: °Eligibility screenings are held every Tuesday and Wednesday afternoon from 1:00 pm until 4:00 pm. You do not need an appointment for the interview!  °[facility] [address], [place], [place] 336-631-2330   °[facility]  336-342-8273   °[facility]  336-703-3100   °[facility]  336-570-6415   ° °Behavioral Health Resources in the Community: °Intensive Outpatient Programs °Organization         Address  Phone  Notes  °[facility] [address], [place], [place] 336-878-6098   °[facility] [address], [place], [place] 336-832-9800   °[facility] [address], [place], [place] ° 336-882-2125   °[facility] [address],  °[place], [place] 1-800-853-5163 or 336-641-4981   °Substance Abuse Resources °Organization         Address  Phone  Notes  °[facility]  336-882-2125   °[facility]  336-784-9470   °[facility]  336-285-9073   °[facility]  336-845-3988   °Residential & Outpatient Substance Abuse Program  1-800-659-3381   °Psychological Services °Organization         Address  Phone  Notes  °[facility]  336- 832-9600   °[facility]  336- 378-7881   °[facility] [address], [place] 1-800-853-5163 or 336-641-4981   ° °Mobile Crisis Teams °Organization         Address  Phone  Notes  °[facility], Mobile Crisis Care Unit  1-877-626-1772   °Assertive °[facility] ° [address]  [place], [place] 336-834-9664   °Sharon DeEsch [address] °[place] 336-554-5454   ° °Self-Help/Support Groups °Organization         Address  Phone             Notes  °[facility]  - variety of support groups  336- 373-1402 Call for more information  °Narcotics Anonymous (NA), [facility] [address], °[place]  2 meetings at this location  ° °  Residential Treatment Programs Organization         Address  Phone  Notes  [facility] [address]    [place]  1-610-960-4540   [facility]  [address] [place] [address], [place], [place] [phone]   [facility] [address] [place], [place] [phone] Admissions: 8am-3pm M-F  [facility] [address],    [place], [place] 295-284-1324   [facility] [address] [place], [place], [place] 401-027-2536   [facility] [address]  [place], [place] [phone]   [facility] Intensive Outpatient [address], [place], [place] [phone]   [facility] ([facility] [address] [place]  [place], [place] 3-295-188-4166 or (670)502-4793   [facility] ([facility] [address] [place], [place] 323-557-3220 Accepts Medicaid  [facility] [place] [address],  [place] 2-542-706-2376 Substance Abuse/Addiction Treatment   [facility]         Address  Phone  Notes  [facility]  (415)130-2749   Angie Fava, PhD [address]vin Knack [place], [place]   212 102 0812 or 769-232-1211   [facility]   [address] [place], [place] [phone]   [facility] [address], [place], [place] 724-636-1350 Insurance/Medicaid/sponsorship through [facility] [address]                                    [place], [place] [phone] Therapy/tele-psych/case    [facility] [address] [place], [place] [phone]    Dr. Lolly Mustache  [phone]   [facility]  [employer] [facility] Dept. 1) [address] [place] 2) [address], [place] 3)  [address], [place] [phone]  (305) [phone]   [facility] Child Abuse Hotline 302-683-7557 or 534-262-7842 (After Hours)      Take the prescription as directed.  Increase your fluid intake (ie:  Gatoraide) for the next few days.  Eat a bland diet and advance to your regular diet slowly as you can tolerate it.  Call your regular medical doctor tomorrow to schedule a follow up appointment within the next 2 to 3 days to recheck your potassium.  Return to the Emergency Department immediately if not improving (or even worsening) despite taking the medicines as prescribed, any black or bloody stool or vomit, if you develop a fever over "101," or for any other concerns.

## 2014-09-30 NOTE — ED Provider Notes (Signed)
CSN: [id number]     Arrival date & time 09/30/14  1933 History   First MD Initiated Contact with Patient 09/30/14 2002     Chief Complaint  Patient presents with  . Emesis     HPI Pt was seen at 2020. Per pt, c/o gradual onset and persistence of multiple intermittent episodes of N/V that began 2 weeks ago. States she has been unable to tol PO for the past 4 days. Has been associated with upper abd "soreness" and "cramping." Pt states her symptoms began after her PMD "kept changing around antibiotics for a cellulitis of my foot." States she is currently not taking any abx.  Denies diarrhea, no CP/SOB, no back pain, no fevers, no black or blood in stools or emesis.     Past Medical History  Diagnosis Date  . Hypertension   . Peripheral neuropathy   . Facial tic   . Diabetes mellitus without complication   . Polysubstance abuse     etoh, opiates  . Migraines   . Depression   . Anxiety   . Osteoarthritis   . Neuropathic pain of both feet     chronic   Past Surgical History  Procedure Laterality Date  . 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 Abdominal hysterectomy  2013  . Ankle fusion Right 1991  . Appendectomy  1987  . Cesarean section  1997  . Abdominoplasty  2013  . Cholecystectomy    . Incision and drainage abscess N/A 09/25/2013    Procedure: INCISION AND DRAINAGE ABSCESS;  Surgeon: Cherylynn Ridges, MD;  Location: [facility] OR;  Service: General;  Laterality: N/A;   Family History  Problem Relation Age of Onset  . Bipolar disorder Sister   . Drug abuse Sister   . Schizophrenia Son   . Bipolar disorder Father   . Alcohol abuse Father   . Drug abuse Mother   . Alcohol abuse Mother    History  Substance Use Topics  . Smoking status: Never Smoker   . Smokeless tobacco: Never Used  . Alcohol Use: No     Comment: Denies alcohol since last June. /history of cocaine and narcotic dependence 07/25/13    Review of Systems ROS: Statement: All systems negative  except as marked or noted in the HPI; Constitutional: Negative for fever and chills. ; ; Eyes: Negative for eye pain, redness and discharge. ; ; ENMT: Negative for ear pain, hoarseness, nasal congestion, sinus pressure and sore throat. ; ; Cardiovascular: Negative for chest pain, palpitations, diaphoresis, dyspnea and peripheral edema. ; ; Respiratory: Negative for cough, wheezing and stridor. ; ; Gastrointestinal: +N/V, abd pain. Negative for diarrhea, blood in stool, hematemesis, jaundice and rectal bleeding. . ; ; Genitourinary: Negative for dysuria, flank pain and hematuria. ; ; Musculoskeletal: Negative for back pain and neck pain. Negative for swelling and trauma.; ; Skin: Negative for pruritus, rash, abrasions, blisters, bruising and skin lesion.; ; Neuro: Negative for headache, lightheadedness and neck stiffness. Negative for weakness, altered level of consciousness , altered mental status, extremity weakness, paresthesias, involuntary movement, seizure and syncope.        Allergies  Iodine; Neomycin; Nsaids; Phenergan; and Sulfa antibiotics  Home Medications   Prior to Admission medications   Medication Sig Start Date End Date Taking? Authorizing Provider  acetaminophen (TYLENOL) 325 MG tablet Take 650 mg by mouth every 6 (six) hours as needed (pain).     Historical Provider, MD  amLODipine (  NORVASC) 10 MG tablet Take 10 mg by mouth daily.    Historical Provider, MD  Armodafinil (NUVIGIL) 200 MG TABS Take 200 mg by mouth daily. 05/15/14   Levert Feinstein, MD  aspirin-acetaminophen-caffeine (EXCEDRIN MIGRAINE) [phone] MG per tablet Take 2 tablets by mouth every 6 (six) hours as needed for headache or migraine.    Historical Provider, MD  cloNIDine (CATAPRES) 0.1 MG tablet Take 0.1 mg by mouth 2 (two) times daily.     Historical Provider, MD  cyclobenzaprine (FLEXERIL) 10 MG tablet Take 1 tablet (10 mg total) by mouth 2 (two) times daily as needed for muscle spasms. 07/20/14   Hope Orlene Och, NP   DULoxetine (CYMBALTA) 60 MG capsule Take 1 capsule (60 mg total) by mouth daily. 11/12/13   Levert Feinstein, MD  eletriptan (RELPAX) 40 MG tablet Take 1 tablet (40 mg total) by mouth as needed for migraine or headache. One tablet by mouth at onset of headache. May repeat in 2 hours if headache persists or recurs. 11/12/13   Levert Feinstein, MD  gabapentin (NEURONTIN) 400 MG capsule Take 1 capsule (400 mg total) by mouth 6 (six) times daily. Patient taking differently: Take 800 mg by mouth 4 (four) times daily.  11/12/13   Levert Feinstein, MD  HYDROcodone-acetaminophen (NORCO/VICODIN) 5-325 MG per tablet Take 1 tablet by mouth every 4 (four) hours as needed. 07/20/14   Hope Orlene Och, NP  lisinopril (PRINIVIL,ZESTRIL) 20 MG tablet Take 20 mg by mouth 2 (two) times daily.    Historical Provider, MD  lisinopril (PRINIVIL,ZESTRIL) 30 MG tablet Take 1 tablet (30 mg total) by mouth 2 (two) times daily. Patient not taking: Reported on 07/20/2014 07/25/13   Beau Fanny, FNP  Multiple Vitamin (MULTIVITAMIN WITH MINERALS) TABS tablet Take 1 tablet by mouth daily.    Historical Provider, MD  naproxen (NAPROSYN) 500 MG tablet Take 1 tablet (500 mg total) by mouth 2 (two) times daily with a meal. Patient not taking: Reported on 07/20/2014 05/20/14   Tammy Triplett, PA-C  naproxen sodium (ALEVE) 220 MG tablet Take 220 mg by mouth 2 (two) times daily with a meal.    Historical Provider, MD  nortriptyline (PAMELOR) 25 MG capsule 3 tabs po qhs xone week, then 4 tabs po qhs Patient taking differently: Take 150 mg by mouth at bedtime.  11/23/13   Levert Feinstein, MD  traMADol (ULTRAM) 50 MG tablet Take 1 tablet (50 mg total) by mouth every 6 (six) hours as needed. 08/06/14   Felicie Morn, NP   BP 169/119 mmHg  Pulse 102  Temp(Src) 98 F (36.7 C) (Oral)  Resp 18  Ht 5\' 4"  (1.626 m)  Wt 170 lb (77.111 kg)  BMI 29.17 kg/m2  SpO2 100%   Filed Vitals:   09/30/14 1938 09/30/14 2130  BP: 169/119 133/85  Pulse: 102 77  Temp: 98 F (36.7 C)    TempSrc: Oral   Resp: 18 14  Height: 5\' 4"  (1.626 m)   Weight: 170 lb (77.111 kg)   SpO2: 100% 96%     Physical Exam  2025: Physical examination:  Nursing notes reviewed; Vital signs and O2 SAT reviewed;  Constitutional: Well developed, Well nourished, In no acute distress.; Head:  Normocephalic, atraumatic; Eyes: EOMI, PERRL, No scleral icterus; ENMT: Mouth and pharynx normal, Mucous membranes dry; Neck: Supple, Full range of motion, No lymphadenopathy; Cardiovascular: Tachycardic rate and rhythm, No gallop; Respiratory: Breath sounds clear & equal bilaterally, No rales, rhonchi, wheezes.  Speaking full sentences  with ease, Normal respiratory effort/excursion; Chest: Nontender, Movement normal; Abdomen: Soft, +mild RUQ, mid-epigastric, LUQ tenderness to palp. No rebound or guarding. Nondistended, Normal bowel sounds; Genitourinary: No CVA tenderness; Extremities: Pulses normal, No tenderness, No rash to feet bilat. No edema, No calf edema or asymmetry.; Neuro: AA&Ox3, Major CN grossly intact.  Speech clear. No gross focal motor or sensory deficits in extremities.; Skin: Color normal, Warm, Dry.   ED Course  Procedures     EKG Interpretation None      MDM  MDM Reviewed: previous chart, nursing note and vitals Reviewed previous: labs Interpretation: labs and x-ray      Results for orders placed or performed during the hospital encounter of 09/30/14  Urinalysis, Routine w reflex microscopic (not at [facility])  Result Value Ref Range   Color, Urine YELLOW YELLOW   APPearance CLEAR CLEAR   Specific Gravity, Urine 1.020 1.005 - 1.030   pH 6.0 5.0 - 8.0   Glucose, UA NEGATIVE NEGATIVE mg/dL   Hgb urine dipstick MODERATE (A) NEGATIVE   Bilirubin Urine NEGATIVE NEGATIVE   Ketones, ur NEGATIVE NEGATIVE mg/dL   Protein, ur NEGATIVE NEGATIVE mg/dL   Urobilinogen, UA 0.2 0.0 - 1.0 mg/dL   Nitrite NEGATIVE NEGATIVE   Leukocytes, UA NEGATIVE NEGATIVE  Comprehensive metabolic panel   Result Value Ref Range   Sodium 139 135 - 145 mmol/L   Potassium 2.8 (L) 3.5 - 5.1 mmol/L   Chloride 97 (L) 101 - 111 mmol/L   CO2 29 22 - 32 mmol/L   Glucose, Bld 111 (H) 65 - 99 mg/dL   BUN 17 6 - 20 mg/dL   Creatinine, Ser 1.61 0.44 - 1.00 mg/dL   Calcium 9.6 8.9 - 09.6 mg/dL   Total Protein 8.3 (H) 6.5 - 8.1 g/dL   Albumin 4.6 3.5 - 5.0 g/dL   AST 29 15 - 41 U/L   ALT 28 14 - 54 U/L   Alkaline Phosphatase 112 38 - 126 U/L   Total Bilirubin 0.2 (L) 0.3 - 1.2 mg/dL   GFR calc non Af Amer >60 >60 mL/min   GFR calc Af Amer >60 >60 mL/min   Anion gap 13 5 - 15  Lipase, blood  Result Value Ref Range   Lipase 21 (L) 22 - 51 U/L  CBC with Differential  Result Value Ref Range   WBC 12.3 (H) 4.0 - 10.5 K/uL   RBC 5.20 (H) 3.87 - 5.11 MIL/uL   Hemoglobin 15.8 (H) 12.0 - 15.0 g/dL   HCT 04.5 40.9 - 81.1 %   MCV 86.2 78.0 - 100.0 fL   MCH 30.4 26.0 - 34.0 pg   MCHC 35.3 30.0 - 36.0 g/dL   RDW 91.4 78.2 - 95.6 %   Platelets 329 150 - 400 K/uL   Neutrophils Relative % 64 43 - 77 %   Neutro Abs 7.9 (H) 1.7 - 7.7 K/uL   Lymphocytes Relative 27 12 - 46 %   Lymphs Abs 3.3 0.7 - 4.0 K/uL   Monocytes Relative 6 3 - 12 %   Monocytes Absolute 0.7 0.1 - 1.0 K/uL   Eosinophils Relative 3 0 - 5 %   Eosinophils Absolute 0.3 0.0 - 0.7 K/uL   Basophils Relative 0 0 - 1 %   Basophils Absolute 0.1 0.0 - 0.1 K/uL  Urine microscopic-add on  Result Value Ref Range   Squamous Epithelial / LPF MANY (A) RARE   WBC, UA 3-6 <3 WBC/hpf   RBC / HPF  7-10 <3 RBC/hpf   Bacteria, UA RARE RARE    Dg Abd Acute W/chest 09/30/2014   CLINICAL DATA:  Abdominal pain, nausea and vomiting for the past 3 days.  EXAM: DG ABDOMEN ACUTE W/ 1V CHEST  COMPARISON:  None.  FINDINGS: Normal sized heart. Clear lungs. There are scattered air-fluid levels in normal caliber right colon and distal small bowel. No free peritoneal air. Cholecystectomy clips. Lower thoracic spine degenerative changes.  IMPRESSION: Scattered  air-fluid levels in normal caliber bowel loops. This can be seen with gastroenteritis.   Electronically Signed   By: Beckie Salts M.D.   On: 09/30/2014 21:28     2200:  Udip contaminated. Feels "better" after IVF and anti-emetics. BP improved. IV potassium and IVF infusing. PO challenge given. Sign out to Dr. Effie Shy.     Samuel Jester, DO 09/30/14 2203

## 2014-10-09 ENCOUNTER — Emergency Department (HOSPITAL_COMMUNITY): Payer: 59

## 2014-10-09 ENCOUNTER — Emergency Department (HOSPITAL_COMMUNITY)
Admission: EM | Admit: 2014-10-09 | Discharge: 2014-10-09 | Discharge status: Against Medical Advice | Payer: 59 | Attending: Emergency Medicine | Admitting: Emergency Medicine

## 2014-10-09 ENCOUNTER — Encounter (HOSPITAL_COMMUNITY): Payer: Self-pay

## 2014-10-09 DIAGNOSIS — Z79899 Other long term (current) drug therapy: Secondary | ICD-10-CM | POA: Diagnosis not present

## 2014-10-09 DIAGNOSIS — F419 Anxiety disorder, unspecified: Secondary | ICD-10-CM | POA: Insufficient documentation

## 2014-10-09 DIAGNOSIS — I10 Essential (primary) hypertension: Secondary | ICD-10-CM | POA: Diagnosis not present

## 2014-10-09 DIAGNOSIS — F329 Major depressive disorder, single episode, unspecified: Secondary | ICD-10-CM | POA: Diagnosis not present

## 2014-10-09 DIAGNOSIS — Z792 Long term (current) use of antibiotics: Secondary | ICD-10-CM | POA: Diagnosis not present

## 2014-10-09 DIAGNOSIS — G43909 Migraine, unspecified, not intractable, without status migrainosus: Secondary | ICD-10-CM | POA: Diagnosis not present

## 2014-10-09 DIAGNOSIS — E119 Type 2 diabetes mellitus without complications: Secondary | ICD-10-CM | POA: Insufficient documentation

## 2014-10-09 DIAGNOSIS — M25571 Pain in right ankle and joints of right foot: Secondary | ICD-10-CM | POA: Insufficient documentation

## 2014-10-09 DIAGNOSIS — M79671 Pain in right foot: Secondary | ICD-10-CM | POA: Diagnosis present

## 2014-10-09 DIAGNOSIS — M1990 Unspecified osteoarthritis, unspecified site: Secondary | ICD-10-CM | POA: Diagnosis not present

## 2014-10-09 DIAGNOSIS — G8929 Other chronic pain: Secondary | ICD-10-CM | POA: Insufficient documentation

## 2014-10-09 DIAGNOSIS — G629 Polyneuropathy, unspecified: Secondary | ICD-10-CM | POA: Diagnosis not present

## 2014-10-09 MED ORDER — PROCHLORPERAZINE EDISYLATE 5 MG/ML IJ SOLN
10.0000 mg | Freq: Once | INTRAMUSCULAR | Status: DC
  Filled 2014-10-09: qty 2

## 2014-10-09 NOTE — ED Notes (Signed)
Pt reports has neuropathy and periodically gets a neurogenic treatment that [facility] started for her.  Pt says she got to level 3 out of 12 and started having severe pain in r foot and ankle.  Pt says can't put any weight on heel.  Pt says she told Dr. Alona Bene while she was in the office but was told it should ease throughout the day.

## 2014-10-09 NOTE — ED Provider Notes (Addendum)
CSN: [id number]     Arrival date & time 10/09/14  1733 History   First MD Initiated Contact with Patient 10/09/14 1933     Chief Complaint  Patient presents with  . Foot Pain    HPI Pt reports ongoing right ankle pain and pain worsened after manipulation today. No other injury. Pain is severe in severity. Prior ankle fusion and persistent recurrent pain in the right ankle. Previously on oxycodone, no longer on oxycodone. Relationship with a primary care physician.     Past Medical History  Diagnosis Date  . Hypertension   . Peripheral neuropathy   . Facial tic   . Diabetes mellitus without complication   . Polysubstance abuse     etoh, opiates  . Migraines   . Depression   . Anxiety   . Osteoarthritis   . Neuropathic pain of both feet     chronic   Past Surgical History  Procedure Laterality Date  . 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 Abdominal hysterectomy  2013  . Ankle fusion Right 1991  . Appendectomy  1987  . Cesarean section  1997  . Abdominoplasty  2013  . Cholecystectomy    . Incision and drainage abscess N/A 09/25/2013    Procedure: INCISION AND DRAINAGE ABSCESS;  Surgeon: Cherylynn Ridges, MD;  Location: [facility] OR;  Service: General;  Laterality: N/A;   Family History  Problem Relation Age of Onset  . Bipolar disorder Sister   . Drug abuse Sister   . Schizophrenia Son   . Bipolar disorder Father   . Alcohol abuse Father   . Drug abuse Mother   . Alcohol abuse Mother    Social History  Substance Use Topics  . Smoking status: Never Smoker   . Smokeless tobacco: Never Used  . Alcohol Use: No     Comment: Denies alcohol since last June. /history of cocaine and narcotic dependence 07/25/13   OB History    No data available     Review of Systems  All other systems reviewed and are negative.     Allergies  Iodine; Neomycin; Nsaids; Phenergan; and Sulfa antibiotics  Home Medications   Prior to Admission medications   Medication  Sig Start Date End Date Taking? Authorizing Provider  amLODipine (NORVASC) 10 MG tablet Take 10 mg by mouth daily.   Yes Historical Provider, MD  amoxicillin (AMOXIL) 500 MG capsule Take 500 mg by mouth 3 (three) times daily. Started 10/04/14   Yes Historical Provider, MD  chlorthalidone (HYGROTON) 25 MG tablet Take 25 mg by mouth 2 (two) times daily. 09/05/14  Yes Historical Provider, MD  clonazePAM (KLONOPIN) 0.5 MG tablet Take 0.5 mg by mouth 2 (two) times daily. 09/13/14  Yes Historical Provider, MD  cloNIDine (CATAPRES) 0.1 MG tablet Take 0.1 mg by mouth 2 (two) times daily.    Yes Historical Provider, MD  DULoxetine (CYMBALTA) 60 MG capsule Take 1 capsule (60 mg total) by mouth daily. 11/12/13  Yes Levert Feinstein, MD  gabapentin (NEURONTIN) 400 MG capsule Take 1 capsule (400 mg total) by mouth 6 (six) times daily. Patient taking differently: Take 800 mg by mouth 6 (six) times daily.  11/12/13  Yes Levert Feinstein, MD  ketorolac (TORADOL) 10 MG tablet Take 10 mg by mouth every 6 (six) hours as needed. pain 10/07/14  Yes Historical Provider, MD  lisinopril (PRINIVIL,ZESTRIL) 20 MG tablet Take 20 mg by mouth 2 (two) times daily.  Yes Historical Provider, MD  Multiple Vitamin (MULTIVITAMIN WITH MINERALS) TABS tablet Take 1 tablet by mouth daily.   Yes Historical Provider, MD  nortriptyline (PAMELOR) 25 MG capsule 3 tabs po qhs xone week, then 4 tabs po qhs Patient taking differently: Take 150 mg by mouth at bedtime.  11/23/13  Yes Levert Feinstein, MD  ondansetron (ZOFRAN) 4 MG tablet Take 4 mg by mouth every 8 (eight) hours as needed for nausea or vomiting.   Yes Historical Provider, MD  Armodafinil (NUVIGIL) 200 MG TABS Take 200 mg by mouth daily. Patient not taking: Reported on 09/30/2014 05/15/14   Levert Feinstein, MD  cyclobenzaprine (FLEXERIL) 10 MG tablet Take 1 tablet (10 mg total) by mouth 2 (two) times daily as needed for muscle spasms. Patient not taking: Reported on 09/30/2014 07/20/14   Janne Napoleon, NP  eletriptan  (RELPAX) 40 MG tablet Take 1 tablet (40 mg total) by mouth as needed for migraine or headache. One tablet by mouth at onset of headache. May repeat in 2 hours if headache persists or recurs. 11/12/13   Levert Feinstein, MD  HYDROcodone-acetaminophen (NORCO/VICODIN) 5-325 MG per tablet Take 1 tablet by mouth every 4 (four) hours as needed. Patient not taking: Reported on 09/30/2014 07/20/14   Janne Napoleon, NP  lisinopril (PRINIVIL,ZESTRIL) 30 MG tablet Take 1 tablet (30 mg total) by mouth 2 (two) times daily. Patient not taking: Reported on 09/30/2014 07/25/13   Beau Fanny, FNP  metoCLOPramide (REGLAN) 10 MG tablet Take 1 tablet (10 mg total) by mouth every 6 (six) hours as needed for nausea or vomiting. Patient not taking: Reported on 10/09/2014 09/30/14   Samuel Jester, DO  naproxen (NAPROSYN) 500 MG tablet Take 1 tablet (500 mg total) by mouth 2 (two) times daily with a meal. Patient not taking: Reported on 07/20/2014 05/20/14   Tammy Triplett, PA-C  traMADol (ULTRAM) 50 MG tablet Take 1 tablet (50 mg total) by mouth every 6 (six) hours as needed. Patient not taking: Reported on 09/30/2014 08/06/14   Felicie Morn, NP   BP 167/93 mmHg  Pulse 104  Temp(Src) 98.8 F (37.1 C) (Oral)  Resp 18  Ht 5\' 3"  (1.6 m)  Wt 173 lb (78.472 kg)  BMI 30.65 kg/m2  SpO2 99% Physical Exam  Constitutional: She is oriented to person, place, and time. She appears well-developed and well-nourished.  HENT:  Head: Normocephalic.  Eyes: EOM are normal.  Neck: Normal range of motion.  Pulmonary/Chest: Effort normal.  Abdominal: She exhibits no distension.  Musculoskeletal: Normal range of motion.  Normal pulses in right foot. Pain with ROM of right ankle. Healed surgical scars consistent with prior orthopedic surgeon. Compartments soft  Neurological: She is alert and oriented to person, place, and time.  Psychiatric: She has a normal mood and affect.  Nursing note and vitals reviewed.   ED Course  Procedures (including  critical care time) Labs Review Labs Reviewed - No data to display  Imaging Review Dg Ankle Complete Right  10/09/2014   CLINICAL DATA:  Severe pain in the right foot and ankle. Previous ankle joint effusion. Peripheral neuropathy.  EXAM: RIGHT ANKLE - COMPLETE 3+ VIEW  COMPARISON:  Foot radiographs dated 11/13/2013  FINDINGS: There is solid fusion of the talus and tibia. 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 No acute osseous abnormality. Old fractures of the distal tibia and fibula have healed. Arthritic changes between the distal tibia and fibula.  IMPRESSION: No acute abnormalities.  Electronically Signed   By: Francene Boyers M.D.   On: 10/09/2014 20:34   Dg Foot Complete Right  10/09/2014   CLINICAL DATA:  Right foot ankle pain.  EXAM: RIGHT FOOT COMPLETE - 3+ VIEW  COMPARISON:  11/13/2013  FINDINGS: There is no evidence of fracture or dislocation. There is evidence of prior tibiotalar arthrodesis with solid osseous bridging. There is severe degenerative changes of the posterior subtalar joint. Soft tissues are unremarkable.  IMPRESSION: No acute osseous injury of the right foot.   Electronically Signed   By: Elige Ko   On: 10/09/2014 20:33  I personally reviewed the imaging tests through PACS system I reviewed available ER/hospitalization records through the EMR    EKG Interpretation None      MDM   Final diagnoses:  Right ankle pain  Chronic pain    Long standing hx of chronic pain. Normal pulses in right foot. No signs of infection. Recent dismissal from pain clinic in [facility] pcp follow up. Offered to treat pain in ER with non narcotic medications. Pt refused.    Azalia Bilis, MD 10/09/14 1610  Azalia Bilis, MD 10/09/14 2046

## 2014-10-09 NOTE — ED Notes (Signed)
Patient receive crutches per request and left without signing discharge or getting papers.

## 2014-10-12 ENCOUNTER — Encounter (HOSPITAL_COMMUNITY): Payer: Self-pay | Admitting: *Deleted

## 2014-10-12 ENCOUNTER — Emergency Department (HOSPITAL_COMMUNITY)
Admission: EM | Admit: 2014-10-12 | Discharge: 2014-10-12 | Disposition: A | Discharge status: Home / Self Care | Payer: 59 | Attending: Emergency Medicine | Admitting: Emergency Medicine

## 2014-10-12 DIAGNOSIS — I10 Essential (primary) hypertension: Secondary | ICD-10-CM | POA: Insufficient documentation

## 2014-10-12 DIAGNOSIS — G629 Polyneuropathy, unspecified: Secondary | ICD-10-CM | POA: Diagnosis not present

## 2014-10-12 DIAGNOSIS — Z79899 Other long term (current) drug therapy: Secondary | ICD-10-CM | POA: Diagnosis not present

## 2014-10-12 DIAGNOSIS — F329 Major depressive disorder, single episode, unspecified: Secondary | ICD-10-CM | POA: Insufficient documentation

## 2014-10-12 DIAGNOSIS — E119 Type 2 diabetes mellitus without complications: Secondary | ICD-10-CM | POA: Insufficient documentation

## 2014-10-12 DIAGNOSIS — M1990 Unspecified osteoarthritis, unspecified site: Secondary | ICD-10-CM | POA: Insufficient documentation

## 2014-10-12 DIAGNOSIS — M25571 Pain in right ankle and joints of right foot: Secondary | ICD-10-CM

## 2014-10-12 DIAGNOSIS — G43909 Migraine, unspecified, not intractable, without status migrainosus: Secondary | ICD-10-CM | POA: Insufficient documentation

## 2014-10-12 DIAGNOSIS — F419 Anxiety disorder, unspecified: Secondary | ICD-10-CM | POA: Diagnosis not present

## 2014-10-12 MED ORDER — OXYCODONE HCL 5 MG PO TABS: 10.0000 mg | ORAL_TABLET | Freq: Four times a day (QID) | ORAL | Status: DC | PRN

## 2014-10-12 NOTE — ED Provider Notes (Signed)
CSN: [id number]     Arrival date & time 10/12/14  1023 History  This chart was scribed for Bethany Memos, MD by Ronney Lion, ED Scribe. This patient was seen in room APA12/APA12 and the patient's care was started at 12:12 PM.     Chief Complaint  Patient presents with  . Ankle Pain   Patient is a 44 y.o. female presenting with ankle pain. The history is provided by the patient. No language interpreter was used.  Ankle Pain Location:  Ankle Injury: no   Ankle location:  R ankle Pain details:    Quality:  Aching and cramping   Radiates to:  Does not radiate   Severity:  Severe   Onset quality:  Sudden   Duration:  3 days Chronicity:  New Foreign body present:  No foreign bodies Relieved by:  None tried Worsened by:  Bearing weight Ineffective treatments: neurontin.   HPI Comments: Bethany Barton is a 44 y.o. female with a history of ankle fusion and neuropathy in her feet followed by a podiatrist at [facility], who presents to the Emergency Department complaining of cramping, aching right ankle pain that is chronic but acutely worsened 3 days ago after having neurogenic injections done at her podiatrist's office. She states that immediately after the procedure, she felt severe pain and initally felt unable to bear weight or ambulate due to the severity of the pain. This is a new problem. Walking exacerbates her pain. Patient has tried 400 mg Neurontin without relief. She requests pain medication - Percocet/oxycodone, to tide her over until she can follow-up with [facility] next week and in 2 weeks with pain management. She states she tried to follow up with the podiatrist but was told to return for more neurogenic treatment, which she doesn't want to do. She reports she was weaned off oxycodone at one point in the past due to job restrictions. 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    Past Medical History  Diagnosis Date  . Hypertension   . Peripheral neuropathy   . Facial  tic   . Diabetes mellitus without complication   . Polysubstance abuse     etoh, opiates  . Migraines   . Depression   . Anxiety   . Osteoarthritis   . Neuropathic pain of both feet     chronic   Past Surgical History  Procedure Laterality Date  . 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 Abdominal hysterectomy  2013  . Ankle fusion Right 1991  . Appendectomy  1987  . Cesarean section  1997  . Abdominoplasty  2013  . Cholecystectomy    . Incision and drainage abscess N/A 09/25/2013    Procedure: INCISION AND DRAINAGE ABSCESS;  Surgeon: Cherylynn Ridges, MD;  Location: [facility] OR;  Service: General;  Laterality: N/A;   Family History  Problem Relation Age of Onset  . Bipolar disorder Sister   . Drug abuse Sister   . Schizophrenia Son   . Bipolar disorder Father   . Alcohol abuse Father   . Drug abuse Mother   . Alcohol abuse Mother    Social History  Substance Use Topics  . Smoking status: Never Smoker   . Smokeless tobacco: Never Used  . Alcohol Use: No     Comment: Denies alcohol since last June. /history of cocaine and narcotic dependence 07/25/13   OB History    No data available  Review of Systems  Musculoskeletal: Positive for arthralgias (right ankle pain).  All other systems reviewed and are negative.     Allergies  Iodine; Neomycin; Nsaids; Phenergan; and Sulfa antibiotics  Home Medications   Prior to Admission medications   Medication Sig Start Date End Date Taking? Authorizing Provider  amLODipine (NORVASC) 10 MG tablet Take 10 mg by mouth daily.    Historical Provider, MD  amoxicillin (AMOXIL) 500 MG capsule Take 500 mg by mouth 3 (three) times daily. Started 10/04/14    Historical Provider, MD  Armodafinil (NUVIGIL) 200 MG TABS Take 200 mg by mouth daily. Patient not taking: Reported on 09/30/2014 05/15/14   Levert Feinstein, MD  chlorthalidone (HYGROTON) 25 MG tablet Take 25 mg by mouth 2 (two) times daily. 09/05/14   Historical Provider, MD   clonazePAM (KLONOPIN) 0.5 MG tablet Take 0.5 mg by mouth 2 (two) times daily. 09/13/14   Historical Provider, MD  cloNIDine (CATAPRES) 0.1 MG tablet Take 0.1 mg by mouth 2 (two) times daily.     Historical Provider, MD  cyclobenzaprine (FLEXERIL) 10 MG tablet Take 1 tablet (10 mg total) by mouth 2 (two) times daily as needed for muscle spasms. Patient not taking: Reported on 09/30/2014 07/20/14   Janne Napoleon, NP  DULoxetine (CYMBALTA) 60 MG capsule Take 1 capsule (60 mg total) by mouth daily. 11/12/13   Levert Feinstein, MD  eletriptan (RELPAX) 40 MG tablet Take 1 tablet (40 mg total) by mouth as needed for migraine or headache. One tablet by mouth at onset of headache. May repeat in 2 hours if headache persists or recurs. 11/12/13   Levert Feinstein, MD  gabapentin (NEURONTIN) 400 MG capsule Take 1 capsule (400 mg total) by mouth 6 (six) times daily. Patient taking differently: Take 800 mg by mouth 6 (six) times daily.  11/12/13   Levert Feinstein, MD  HYDROcodone-acetaminophen (NORCO/VICODIN) 5-325 MG per tablet Take 1 tablet by mouth every 4 (four) hours as needed. Patient not taking: Reported on 09/30/2014 07/20/14   Janne Napoleon, NP  ketorolac (TORADOL) 10 MG tablet Take 10 mg by mouth every 6 (six) hours as needed. pain 10/07/14   Historical Provider, MD  lisinopril (PRINIVIL,ZESTRIL) 20 MG tablet Take 20 mg by mouth 2 (two) times daily.    Historical Provider, MD  lisinopril (PRINIVIL,ZESTRIL) 30 MG tablet Take 1 tablet (30 mg total) by mouth 2 (two) times daily. Patient not taking: Reported on 09/30/2014 07/25/13   Beau Fanny, FNP  metoCLOPramide (REGLAN) 10 MG tablet Take 1 tablet (10 mg total) by mouth every 6 (six) hours as needed for nausea or vomiting. Patient not taking: Reported on 10/09/2014 09/30/14   Samuel Jester, DO  Multiple Vitamin (MULTIVITAMIN WITH MINERALS) TABS tablet Take 1 tablet by mouth daily.    Historical Provider, MD  naproxen (NAPROSYN) 500 MG tablet Take 1 tablet (500 mg total) by mouth 2  (two) times daily with a meal. Patient not taking: Reported on 07/20/2014 05/20/14   Tammy Triplett, PA-C  nortriptyline (PAMELOR) 25 MG capsule 3 tabs po qhs xone week, then 4 tabs po qhs Patient taking differently: Take 150 mg by mouth at bedtime.  11/23/13   Levert Feinstein, MD  ondansetron (ZOFRAN) 4 MG tablet Take 4 mg by mouth every 8 (eight) hours as needed for nausea or vomiting.    Historical Provider, MD  oxyCODONE (ROXICODONE) 5 MG immediate release tablet Take 2 tablets (10 mg total) by mouth every 6 (six) hours as needed  for breakthrough pain. 10/12/14   Bethany Memos, MD  traMADol (ULTRAM) 50 MG tablet Take 1 tablet (50 mg total) by mouth every 6 (six) hours as needed. Patient not taking: Reported on 09/30/2014 08/06/14   Felicie Morn, NP   BP 168/90 mmHg  Pulse 88  Temp(Src) 98 F (36.7 C) (Oral)  Resp 16  Ht 5\' 3"  (1.6 m)  Wt 170 lb (77.111 kg)  BMI 30.12 kg/m2  SpO2 100% Physical Exam  Constitutional: She is oriented to person, place, and time. She appears well-developed and well-nourished. No distress.  HENT:  Head: Normocephalic and atraumatic.  Eyes: Conjunctivae and EOM are normal.  Neck: Neck supple. No tracheal deviation present.  Cardiovascular: Normal rate.   Pulmonary/Chest: Effort normal. No respiratory distress.  Musculoskeletal:  No swelling in RLE. No calf tenderness. Thompson's test negative. Intact DP and PT pulses.   Neurological: She is alert and oriented to person, place, and time. No cranial nerve deficit. She exhibits normal muscle tone. Coordination normal.  Sensory intact. Gross motor intact.   Skin: Skin is warm and dry.  Psychiatric: She has a normal mood and affect. Her behavior is normal.  Nursing note and vitals reviewed.   ED Course  Procedures (including critical care time)  DIAGNOSTIC STUDIES: Oxygen Saturation is 100% on RA, normal by my interpretation.    COORDINATION OF CARE: 12:28 PM - Discussed treatment plan with pt at bedside which  includes short-term course of oxycodone for 3 days, until patient can follow up at her next appointment. Pt verbalized understanding and agreed to plan.    MDM   Final diagnoses:  Ankle pain, right    Acute exacerbation of chronic pain. No new trauma or other causes. No e/o DVT. No e/o cellulitis. Has appt already scheduled with podiatry and PCP. Working on getting in pain clinic. Asking for narcotics to help tide her over and given 2 days worth to get her until Monday. After further review of records after discharge it seems patient is often in ED for pain related complaints and requesting narcotics. Also apparently had been recently discharged from her pain clinic which she did not tell me about which makes me suspicious for malingering/drug seeking behavior.   I have personally and contemperaneously reviewed labs and imaging and used in my decision making as above.   A medical screening exam was performed and I feel the patient has had an appropriate workup for their chief complaint at this time and likelihood of emergent condition existing is low. They have been counseled on decision, discharge, follow up and which symptoms necessitate immediate return to the emergency department. They or their family verbally stated understanding and agreement with plan and discharged in stable condition.    I personally performed the services described in this documentation, which was scribed in my presence. The recorded information has been reviewed and is accurate.      Bethany Memos, MD 10/13/14 1329

## 2014-10-12 NOTE — ED Notes (Addendum)
Pt states she recently had an ankle block and neurogenic treatment and the pain is so severe since having the procedure done. She is unable to bear weight on right foot. Pt is hypertensive today, stating she has taken her meds.

## 2014-10-25 ENCOUNTER — Encounter (HOSPITAL_COMMUNITY): Payer: Self-pay | Admitting: *Deleted

## 2014-10-25 ENCOUNTER — Emergency Department (HOSPITAL_COMMUNITY): Payer: 59

## 2014-10-25 ENCOUNTER — Inpatient Hospital Stay (HOSPITAL_COMMUNITY)
Admission: EM | Admit: 2014-10-25 | Discharge: 2014-10-26 | DRG: 684 | Disposition: A | Discharge status: Home / Self Care | Payer: 59 | Attending: Internal Medicine | Admitting: Internal Medicine

## 2014-10-25 DIAGNOSIS — T464X5A Adverse effect of angiotensin-converting-enzyme inhibitors, initial encounter: Secondary | ICD-10-CM | POA: Diagnosis present

## 2014-10-25 DIAGNOSIS — M25511 Pain in right shoulder: Secondary | ICD-10-CM | POA: Diagnosis present

## 2014-10-25 DIAGNOSIS — M1990 Unspecified osteoarthritis, unspecified site: Secondary | ICD-10-CM | POA: Diagnosis present

## 2014-10-25 DIAGNOSIS — R1110 Vomiting, unspecified: Secondary | ICD-10-CM | POA: Diagnosis not present

## 2014-10-25 DIAGNOSIS — Z818 Family history of other mental and behavioral disorders: Secondary | ICD-10-CM | POA: Diagnosis not present

## 2014-10-25 DIAGNOSIS — E860 Dehydration: Secondary | ICD-10-CM | POA: Diagnosis present

## 2014-10-25 DIAGNOSIS — I959 Hypotension, unspecified: Secondary | ICD-10-CM | POA: Diagnosis present

## 2014-10-25 DIAGNOSIS — F419 Anxiety disorder, unspecified: Secondary | ICD-10-CM | POA: Diagnosis not present

## 2014-10-25 DIAGNOSIS — N179 Acute kidney failure, unspecified: Secondary | ICD-10-CM | POA: Diagnosis not present

## 2014-10-25 DIAGNOSIS — I10 Essential (primary) hypertension: Secondary | ICD-10-CM | POA: Diagnosis present

## 2014-10-25 DIAGNOSIS — T502X5A Adverse effect of carbonic-anhydrase inhibitors, benzothiadiazides and other diuretics, initial encounter: Secondary | ICD-10-CM | POA: Diagnosis present

## 2014-10-25 DIAGNOSIS — R197 Diarrhea, unspecified: Secondary | ICD-10-CM | POA: Diagnosis not present

## 2014-10-25 DIAGNOSIS — Z811 Family history of alcohol abuse and dependence: Secondary | ICD-10-CM | POA: Diagnosis not present

## 2014-10-25 DIAGNOSIS — R112 Nausea with vomiting, unspecified: Secondary | ICD-10-CM | POA: Diagnosis present

## 2014-10-25 DIAGNOSIS — E1142 Type 2 diabetes mellitus with diabetic polyneuropathy: Secondary | ICD-10-CM | POA: Diagnosis present

## 2014-10-25 DIAGNOSIS — F411 Generalized anxiety disorder: Secondary | ICD-10-CM | POA: Diagnosis not present

## 2014-10-25 DIAGNOSIS — W06XXXA Fall from bed, initial encounter: Secondary | ICD-10-CM | POA: Diagnosis present

## 2014-10-25 LAB — COMPREHENSIVE METABOLIC PANEL
ALT: 19 U/L (ref 14–54)
AST: 15 U/L (ref 15–41)
Albumin: 3.4 g/dL — ABNORMAL LOW (ref 3.5–5.0)
Alkaline Phosphatase: 104 U/L (ref 38–126)
Anion gap: 12 (ref 5–15)
BUN: 46 mg/dL — ABNORMAL HIGH (ref 6–20)
CHLORIDE: 103 mmol/L (ref 101–111)
CO2: 19 mmol/L — AB (ref 22–32)
CREATININE: 4.04 mg/dL — AB (ref 0.44–1.00)
Calcium: 8.2 mg/dL — ABNORMAL LOW (ref 8.9–10.3)
GFR, EST AFRICAN AMERICAN: 15 mL/min — AB (ref >60)
GFR, EST NON AFRICAN AMERICAN: 13 mL/min — AB (ref >60)
Glucose, Bld: 102 mg/dL — ABNORMAL HIGH (ref 65–99)
POTASSIUM: 4 mmol/L (ref 3.5–5.1)
SODIUM: 134 mmol/L — AB (ref 135–145)
Total Bilirubin: 0.6 mg/dL (ref 0.3–1.2)
Total Protein: 6.2 g/dL — ABNORMAL LOW (ref 6.5–8.1)

## 2014-10-25 LAB — CBC WITH DIFFERENTIAL/PLATELET
BASOS ABS: 0 10*3/uL (ref 0.0–0.1)
Basophils Relative: 0 % (ref 0–1)
EOS PCT: 1 % (ref 0–5)
Eosinophils Absolute: 0.1 10*3/uL (ref 0.0–0.7)
HEMATOCRIT: 39.9 % (ref 36.0–46.0)
HEMOGLOBIN: 13.1 g/dL (ref 12.0–15.0)
LYMPHS ABS: 1.5 10*3/uL (ref 0.7–4.0)
LYMPHS PCT: 14 % (ref 12–46)
MCH: 30.2 pg (ref 26.0–34.0)
MCHC: 32.8 g/dL (ref 30.0–36.0)
MCV: 91.9 fL (ref 78.0–100.0)
Monocytes Absolute: 0.9 10*3/uL (ref 0.1–1.0)
Monocytes Relative: 9 % (ref 3–12)
NEUTROS ABS: 8.3 10*3/uL — AB (ref 1.7–7.7)
Neutrophils Relative %: 76 % (ref 43–77)
Platelets: 223 10*3/uL (ref 150–400)
RBC: 4.34 MIL/uL (ref 3.87–5.11)
RDW: 13.4 % (ref 11.5–15.5)
WBC: 10.9 10*3/uL — AB (ref 4.0–10.5)

## 2014-10-25 LAB — LACTIC ACID, PLASMA: Lactic Acid, Venous: 1.3 mmol/L (ref 0.5–2.0)

## 2014-10-25 LAB — URINE MICROSCOPIC-ADD ON

## 2014-10-25 LAB — URINALYSIS, ROUTINE W REFLEX MICROSCOPIC
Bilirubin Urine: NEGATIVE
GLUCOSE, UA: NEGATIVE mg/dL
KETONES UR: NEGATIVE mg/dL
LEUKOCYTES UA: NEGATIVE
NITRITE: NEGATIVE
PH: 5 (ref 5.0–8.0)
Protein, ur: NEGATIVE mg/dL
SPECIFIC GRAVITY, URINE: 1.01 (ref 1.005–1.030)
Urobilinogen, UA: 0.2 mg/dL (ref 0.0–1.0)

## 2014-10-25 LAB — PREGNANCY, URINE: Preg Test, Ur: NEGATIVE

## 2014-10-25 LAB — C DIFFICILE QUICK SCREEN W PCR REFLEX
C DIFFICILE (CDIFF) INTERP: NEGATIVE
C DIFFICILE (CDIFF) TOXIN: NEGATIVE
C DIFFICLE (CDIFF) ANTIGEN: NEGATIVE

## 2014-10-25 LAB — LIPASE, BLOOD: LIPASE: 22 U/L (ref 22–51)

## 2014-10-25 MED ORDER — METRONIDAZOLE IN NACL 5-0.79 MG/ML-% IV SOLN: 500.0000 mg | Freq: Three times a day (TID) | INTRAVENOUS | Status: DC

## 2014-10-25 MED ORDER — SODIUM CHLORIDE 0.9 % IV SOLN
1000.0000 mL | Freq: Once | INTRAVENOUS | Status: AC
  Administered 2014-10-25: 1000 mL via INTRAVENOUS

## 2014-10-25 MED ORDER — PIPERACILLIN-TAZOBACTAM 3.375 G IVPB 30 MIN
3.3750 g | Freq: Once | INTRAVENOUS | Status: AC
  Administered 2014-10-25: 3.375 g via INTRAVENOUS
  Filled 2014-10-25: qty 50

## 2014-10-25 MED ORDER — PIPERACILLIN-TAZOBACTAM IN DEX 2-0.25 GM/50ML IV SOLN
2.2500 g | Freq: Four times a day (QID) | INTRAVENOUS | Status: DC
  Filled 2014-10-25 (×11): qty 50

## 2014-10-25 MED ORDER — ONDANSETRON HCL 4 MG PO TABS
4.0000 mg | ORAL_TABLET | Freq: Four times a day (QID) | ORAL | Status: DC | PRN
Start: 2014-10-25 — End: 2014-10-26

## 2014-10-25 MED ORDER — DEXTROSE 5 % IN LACTATED RINGERS IV BOLUS: 1000.0000 mL | Freq: Once | INTRAVENOUS | Status: DC

## 2014-10-25 MED ORDER — LACTATED RINGERS IV BOLUS (SEPSIS)
1000.0000 mL | Freq: Once | INTRAVENOUS | Status: AC
  Administered 2014-10-25: 1000 mL via INTRAVENOUS

## 2014-10-25 MED ORDER — HEPARIN SODIUM (PORCINE) 5000 UNIT/ML IJ SOLN
5000.0000 [IU] | Freq: Three times a day (TID) | INTRAMUSCULAR | Status: DC
  Administered 2014-10-25 – 2014-10-26 (×2): 5000 [IU] via SUBCUTANEOUS
  Filled 2014-10-25 (×2): qty 1

## 2014-10-25 MED ORDER — CLONAZEPAM 0.5 MG PO TABS
0.5000 mg | ORAL_TABLET | Freq: Two times a day (BID) | ORAL | Status: DC
  Administered 2014-10-26: 0.5 mg via ORAL
  Filled 2014-10-25: qty 1

## 2014-10-25 MED ORDER — METRONIDAZOLE 500 MG PO TABS
500.0000 mg | ORAL_TABLET | Freq: Once | ORAL | Status: AC
Start: 2014-10-25 — End: 2014-10-25
  Administered 2014-10-25: 500 mg via ORAL
  Filled 2014-10-25: qty 1

## 2014-10-25 MED ORDER — ONDANSETRON HCL 4 MG/2ML IJ SOLN
4.0000 mg | Freq: Once | INTRAMUSCULAR | Status: AC
  Administered 2014-10-25: 4 mg via INTRAVENOUS
  Filled 2014-10-25: qty 2

## 2014-10-25 MED ORDER — ONDANSETRON HCL 4 MG/2ML IJ SOLN: 4.0000 mg | Freq: Four times a day (QID) | INTRAMUSCULAR | Status: DC | PRN

## 2014-10-25 MED ORDER — SODIUM CHLORIDE 0.9 % IV SOLN
INTRAVENOUS | Status: DC
  Administered 2014-10-26: 01:00:00 via INTRAVENOUS

## 2014-10-25 MED ORDER — SODIUM CHLORIDE 0.9 % IV BOLUS (SEPSIS)
1000.0000 mL | Freq: Once | INTRAVENOUS | Status: AC
  Administered 2014-10-25: 1000 mL via INTRAVENOUS

## 2014-10-25 MED ORDER — CYCLOBENZAPRINE HCL 10 MG PO TABS: 10.0000 mg | ORAL_TABLET | Freq: Two times a day (BID) | ORAL | Status: DC | PRN

## 2014-10-25 MED ORDER — METOCLOPRAMIDE HCL 5 MG/ML IJ SOLN
10.0000 mg | Freq: Once | INTRAMUSCULAR | Status: AC
  Administered 2014-10-25: 10 mg via INTRAVENOUS
  Filled 2014-10-25: qty 2

## 2014-10-25 NOTE — H&P (Signed)
PCP:   Kirstie Peri, MD   Chief Complaint:  Diarrhea  HPI: 44 year old female who   has a past medical history of Hypertension; Peripheral neuropathy; Facial tic; Diabetes mellitus without complication; Polysubstance abuse; Migraines; Depression; Anxiety; Osteoarthritis; and Neuropathic pain of both feet. Today presents to the hospital with chief complaint of diarrhea which has been ongoing for past 7 days. Patient was diagnosed with right foot cellulitis 3 weeks ago and was initially prescribed Augmentin, which was changed after 3 days to Cipro and clindamycin as the foot infection was not getting better. After completing the antibiotic patient was again prescribed Rocephin for 3 days for presumed sore throat and later prescribed amoxicillin for 10 days. After patient took her last antibiotic on Friday last week she developed diarrhea. Patient also takes chlorthalidone, lisinopril, Catapres, amlodipine for hypertension. In the meantime patient also switched her primary care providers and saw Dr. Sherwood Gambler, who changed her Catapres to .2 mg 3 times a day. Patient today had about 40 loose bowel movements. She also complained of nausea and vomiting In the ED patient was found to be profoundly hypotensive and has required 5 L of normal saline, lactic acid is normal 1.3. Patient is mentating well. Lab work revealed acute kidney injury with creatinine of 4.04, baseline is 0.71 as of 09/30/2014. Stool sample has been obtained for possible C. difficile. Patient empirically started on IV Flagyl Patient also complained of right shoulder pain after she fell from her bed this morning, the x-ray of the right shoulder is negative for any acute fracture.  Allergies:   Allergies  Allergen Reactions  . Iodine Other (See Comments)    Blisters  . Neomycin Other (See Comments)    Blisters   . Nsaids Other (See Comments)    Due to gastric bypass.  [name] [Promethazine Hcl] Other (See Comments)    Altered  Mental Status  . Sulfa Antibiotics Nausea And Vomiting      Past Medical History  Diagnosis Date  . Hypertension   . Peripheral neuropathy   . Facial tic   . Diabetes mellitus without complication   . Polysubstance abuse     etoh, opiates  . Migraines   . Depression   . Anxiety   . Osteoarthritis   . Neuropathic pain of both feet     chronic    Past Surgical History  Procedure Laterality Date  . 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 Abdominal hysterectomy  2013  . Ankle fusion Right 1991  . Appendectomy  1987  . Cesarean section  1997  . Abdominoplasty  2013  . Cholecystectomy    . Incision and drainage abscess N/A 09/25/2013    Procedure: INCISION AND DRAINAGE ABSCESS;  Surgeon: Cherylynn Ridges, MD;  Location: [facility] OR;  Service: General;  Laterality: N/A;  . Cholecystectomy      Prior to Admission medications   Medication Sig Start Date End Date Taking? Authorizing Provider  amLODipine (NORVASC) 10 MG tablet Take 10 mg by mouth daily.   Yes Historical Provider, MD  chlorthalidone (HYGROTON) 25 MG tablet Take 25 mg by mouth 2 (two) times daily. 09/05/14  Yes Historical Provider, MD  clonazePAM (KLONOPIN) 0.5 MG tablet Take 0.5 mg by mouth 2 (two) times daily. 09/13/14  Yes Historical Provider, MD  cloNIDine (CATAPRES) 0.2 MG tablet Take 0.2 mg by mouth 3 (three) times daily.   Yes Historical Provider, MD  eletriptan (RELPAX) 40 MG tablet  Take 1 tablet (40 mg total) by mouth as needed for migraine or headache. One tablet by mouth at onset of headache. May repeat in 2 hours if headache persists or recurs. 11/12/13  Yes Levert Feinstein, MD  L-Methylfolate-B6-B12 (FOLTANX) 3-35-2 MG TABS Take 1 tablet by mouth 2 (two) times daily. 10/16/14  Yes Historical Provider, MD  lisinopril (PRINIVIL,ZESTRIL) 20 MG tablet Take 20 mg by mouth 2 (two) times daily.   Yes Historical Provider, MD  Multiple Vitamin (MULTIVITAMIN WITH MINERALS) TABS tablet Take 1 tablet by mouth daily.    Yes Historical Provider, MD  ondansetron (ZOFRAN) 4 MG tablet Take 4 mg by mouth every 8 (eight) hours as needed for nausea or vomiting.   Yes Historical Provider, MD  oxyCODONE (ROXICODONE) 5 MG immediate release tablet Take 2 tablets (10 mg total) by mouth every 6 (six) hours as needed for breakthrough pain. 10/12/14  Yes [name], MD  Oxycodone HCl 10 MG TABS Take 10 mg by mouth every 3 (three) hours as needed (for pain).  10/16/14  Yes Historical Provider, MD  Armodafinil (NUVIGIL) 200 MG TABS Take 200 mg by mouth daily. Patient not taking: Reported on 09/30/2014 05/15/14   Levert Feinstein, MD  cyclobenzaprine (FLEXERIL) 10 MG tablet Take 1 tablet (10 mg total) by mouth 2 (two) times daily as needed for muscle spasms. Patient not taking: Reported on 09/30/2014 07/20/14   Janne Napoleon, NP  gabapentin (NEURONTIN) 400 MG capsule Take 1 capsule (400 mg total) by mouth 6 (six) times daily. Patient not taking: Reported on 10/25/2014 11/12/13   Levert Feinstein, MD  metoCLOPramide (REGLAN) 10 MG tablet Take 1 tablet (10 mg total) by mouth every 6 (six) hours as needed for nausea or vomiting. Patient not taking: Reported on 10/09/2014 09/30/14   Samuel Jester, DO  naproxen (NAPROSYN) 500 MG tablet Take 1 tablet (500 mg total) by mouth 2 (two) times daily with a meal. Patient not taking: Reported on 07/20/2014 05/20/14   Tammy Triplett, PA-C  nortriptyline (PAMELOR) 25 MG capsule 3 tabs po qhs xone week, then 4 tabs po qhs Patient not taking: Reported on 10/25/2014 11/23/13   Levert Feinstein, MD  traMADol (ULTRAM) 50 MG tablet Take 1 tablet (50 mg total) by mouth every 6 (six) hours as needed. Patient not taking: Reported on 09/30/2014 08/06/14   Felicie Morn, NP    Social History:  reports that she has never smoked. She has never used smokeless tobacco. She reports that she does not drink alcohol or use illicit drugs.  Family History  Problem Relation Age of Onset  . Bipolar disorder Sister   . Drug abuse Sister   .  Schizophrenia Son   . Bipolar disorder Father   . Alcohol abuse Father   . Drug abuse Mother   . Alcohol abuse Mother     Ceasar Mons Weights   10/25/14 1557 10/25/14 1944  Weight: 73.936 kg (163 lb) 73.936 kg (163 lb)    All the positives are listed in BOLD  Review of Systems:  HEENT: Headache, blurred vision, runny nose, sore throat Neck: Hypothyroidism, hyperthyroidism,,lymphadenopathy Chest : Shortness of breath, history of COPD, Asthma Heart : Chest pain, history of coronary arterey disease GI:  Nausea, vomiting, diarrhea, constipation, GERD GU: Dysuria, urgency, frequency of urination, hematuria Neuro: Stroke, seizures, syncope Psych: Depression, anxiety, hallucinations   Physical Exam: Blood pressure 92/57, pulse 71, temperature 97.5 F (36.4 C), temperature source Oral, resp. rate 12, height 5\' 3"  (1.6 m), weight 73.936  kg (163 lb), SpO2 100 %. Constitutional:   Patient is a well-developed and well-nourished female* in no acute distress and cooperative with exam. Head: Normocephalic and atraumatic Mouth: Mucus membranes moist Eyes: PERRL, EOMI, conjunctivae normal Neck: Supple, No Thyromegaly Cardiovascular: RRR, S1 normal, S2 normal Pulmonary/Chest: CTAB, no wheezes, rales, or rhonchi Abdominal: Soft. Mild generalized tenderness to palpation, non-distended, bowel sounds are normal, no masses, organomegaly, or guarding present.  Neurological: A&O x3, Strength is normal and symmetric bilaterally, cranial nerve II-XII are grossly intact, no focal motor deficit, sensory intact to light touch bilaterally.  Extremities : No Cyanosis, Clubbing or Edema  Labs on Admission:  Basic Metabolic Panel:  Recent Labs Lab 10/25/14 1714  NA 134*  K 4.0  CL 103  CO2 19*  GLUCOSE 102*  BUN 46*  CREATININE 4.04*  CALCIUM 8.2*   Liver Function Tests:  Recent Labs Lab 10/25/14 1714  AST 15  ALT 19  ALKPHOS 104  BILITOT 0.6  PROT 6.2*  ALBUMIN 3.4*    Recent Labs Lab  10/25/14 1714  LIPASE 22   No results for input(s): AMMONIA in the last 168 hours. CBC:  Recent Labs Lab 10/25/14 1714  WBC 10.9*  NEUTROABS 8.3*  HGB 13.1  HCT 39.9  MCV 91.9  PLT 223    Radiological Exams on Admission: Dg Shoulder Right  10/25/2014   CLINICAL DATA:  Right shoulder pain. Larey Seat out of bed this morning landing on the right arm.  EXAM: RIGHT SHOULDER - 2+ VIEW  COMPARISON:  None.  FINDINGS: There is no evidence of fracture or dislocation. There is no evidence of arthropathy or other focal bone abnormality. Soft tissues are unremarkable.  IMPRESSION: Negative.   Electronically Signed   By: Burman Nieves M.D.   On: 10/25/2014 18:19   Dg Chest Port 1 View  10/25/2014   CLINICAL DATA:  Code Sepsis. Generalized abd pain, vomiting, diarrhea, hypotension since this AM. History of diabetes, HTN.  EXAM: PORTABLE CHEST - 1 VIEW  COMPARISON:  09/30/2014  FINDINGS: Cardiac silhouette normal in size and configuration. Normal mediastinal and hilar contours. Clear lungs. No pleural effusion or pneumothorax.  Bony thorax is unremarkable.  IMPRESSION: No active disease.   Electronically Signed   By: Amie Portland M.D.   On: 10/25/2014 20:24       Assessment/Plan Active Problems:   Nausea and vomiting   Anxiety   AKI (acute kidney injury)   Acute kidney injury Patient presented with acute kidney injury secondary to dehydration from nausea vomiting and diarrhea, along with hypotension caused from diuretics and antihypertensives medications. Will continue with aggressive IV fluid resuscitation. She has received 5 L of IV fluid in the ED, will start normal saline at 125 mL per hour. We will get nephrology consultation in a.m.  Diarrhea Patient presenting with diarrhea, likely antibiotic associated versus C. difficile colitis. Stool for C. difficile PCR has been obtained. Patient  empirically started on IV Flagyl. Follow the stool sample for C. difficile PCR  Nausea and  vomiting Start Zofran when necessary. Keep patient nothing by mouth  History of hypertension, currently hypotensive Patient is on multiple antihypertensives medications including amlodipine, Catapres, chlorthalidone, lisinopril. All these medications will be held as patient is currently hypotensive. Once patient's blood pressure improves, these medications can be restarted.  DVT prophylaxis Heparin    Code status: Full code  Family discussion: No family at bedside   Time Spent on Admission: 60 min  Rilee Knoll S [facility] Pager:  [phone] 10/25/2014, 9:35 PM  If 7PM-7AM, please contact night-coverage  [email or web address]  Password [email or web address]

## 2014-10-25 NOTE — Progress Notes (Signed)
ANTIBIOTIC CONSULT NOTE - INITIAL  Pharmacy Consult for zosyn Indication: intra abdominal infection  Allergies  Allergen Reactions  . Iodine Other (See Comments)    Blisters  . Neomycin Other (See Comments)    Blisters   . Nsaids Other (See Comments)    Due to gastric bypass.  [name] [Promethazine Hcl] Other (See Comments)    Altered Mental Status  . Sulfa Antibiotics Nausea And Vomiting    Patient Measurements: Height:  (160 cm) Weight: 163 lb (73.936 kg) IBW/kg (Calculated) : 52.4  Vital Signs: Temp: 97.5 F (36.4 C) (08/26 1557) Temp Source: Oral (08/26 1557) BP: 70/34 mmHg (08/26 1930) Pulse Rate: 77 (08/26 1557) Intake/Output from previous day:   Intake/Output from this shift:    Labs:  Recent Labs  10/25/14 1714  WBC 10.9*  HGB 13.1  PLT 223  CREATININE 4.04*   Estimated Creatinine Clearance: 17.5 mL/min (by C-G formula based on Cr of 4.04). No results for input(s): VANCOTROUGH, VANCOPEAK, VANCORANDOM, GENTTROUGH, GENTPEAK, GENTRANDOM, TOBRATROUGH, TOBRAPEAK, TOBRARND, AMIKACINPEAK, AMIKACINTROU, AMIKACIN in the last 72 hours.   Microbiology: No results found for this or any previous visit (from the past 720 hour(s)).  Medical History: Past Medical History  Diagnosis Date  . Hypertension   . Peripheral neuropathy   . Facial tic   . Diabetes mellitus without complication   . Polysubstance abuse     etoh, opiates  . Migraines   . Depression   . Anxiety   . Osteoarthritis   . Neuropathic pain of both feet     chronic    Medications:  See medication history Assessment: 44 yo lady to start zosyn for intra-abdominal infection.  Her SrCr is elevated, CrCl ~ 17 ml/min  Goal of Therapy:  Eradication of infection  Plan:  Zosyn 3.375 gm IV ordered in ED.  Will continue therapy with zosyn 2.25 gm IV q6 hours F/u renal function, cultures and clinical course  Thanks for allowing pharmacy to be a part of this patient's care.  Talbert Cage, PharmD Clinical Pharmacist 10/25/2014,7:54 PM

## 2014-10-25 NOTE — ED Notes (Signed)
Called to attempt to give report and I was told that this pt was unstable and that the ICU could not accept this pt due to her blood pressure. I will notify Dr. Sharl Ma about this for further orders or other.

## 2014-10-25 NOTE — ED Notes (Signed)
Lab attempted to stick pt for blood cultures multiple times, unable to obtain blood cultures, Dr. Clarene Duke notified.  Dr. Clarene Duke told nurse to start IV antibiotics.

## 2014-10-25 NOTE — ED Notes (Signed)
Dr. Clarene Duke notified of BP 75/45

## 2014-10-25 NOTE — ED Notes (Addendum)
Abdominal pain, vomiting and diarrhea. States her doctor recently has taken her off of several mediations. Pt states BP med was increased last visit. SBP 84

## 2014-10-25 NOTE — ED Notes (Signed)
Notified dr. Clarene Duke of bp.

## 2014-10-26 DIAGNOSIS — N179 Acute kidney failure, unspecified: Principal | ICD-10-CM

## 2014-10-26 DIAGNOSIS — R1110 Vomiting, unspecified: Secondary | ICD-10-CM

## 2014-10-26 DIAGNOSIS — R197 Diarrhea, unspecified: Secondary | ICD-10-CM

## 2014-10-26 LAB — CBC
HEMATOCRIT: 36.4 % (ref 36.0–46.0)
Hemoglobin: 12 g/dL (ref 12.0–15.0)
MCH: 30 pg (ref 26.0–34.0)
MCHC: 33 g/dL (ref 30.0–36.0)
MCV: 91 fL (ref 78.0–100.0)
Platelets: 197 10*3/uL (ref 150–400)
RBC: 4 MIL/uL (ref 3.87–5.11)
RDW: 13.2 % (ref 11.5–15.5)
WBC: 6.4 10*3/uL (ref 4.0–10.5)

## 2014-10-26 LAB — MRSA PCR SCREENING: MRSA BY PCR: NEGATIVE

## 2014-10-26 LAB — COMPREHENSIVE METABOLIC PANEL
ALT: 17 U/L (ref 14–54)
AST: 12 U/L — AB (ref 15–41)
Albumin: 3 g/dL — ABNORMAL LOW (ref 3.5–5.0)
Alkaline Phosphatase: 92 U/L (ref 38–126)
Anion gap: 8 (ref 5–15)
BILIRUBIN TOTAL: 0.7 mg/dL (ref 0.3–1.2)
BUN: 19 mg/dL (ref 6–20)
CO2: 25 mmol/L (ref 22–32)
CREATININE: 0.8 mg/dL (ref 0.44–1.00)
Calcium: 8.2 mg/dL — ABNORMAL LOW (ref 8.9–10.3)
Chloride: 106 mmol/L (ref 101–111)
Glucose, Bld: 79 mg/dL (ref 65–99)
Potassium: 3.7 mmol/L (ref 3.5–5.1)
Sodium: 139 mmol/L (ref 135–145)
TOTAL PROTEIN: 5.6 g/dL — AB (ref 6.5–8.1)

## 2014-10-26 LAB — LACTIC ACID, PLASMA: Lactic Acid, Venous: 0.5 mmol/L (ref 0.5–2.0)

## 2014-10-26 MED ORDER — ONDANSETRON HCL 8 MG PO TABS: 8.0000 mg | ORAL_TABLET | Freq: Three times a day (TID) | ORAL | Status: DC | PRN

## 2014-10-26 MED ORDER — OXYCODONE HCL 5 MG PO TABS
5.0000 mg | ORAL_TABLET | ORAL | Status: DC | PRN
  Administered 2014-10-26 (×3): 5 mg via ORAL
  Filled 2014-10-26 (×3): qty 1

## 2014-10-26 MED ORDER — METOCLOPRAMIDE HCL 5 MG/ML IJ SOLN
5.0000 mg | Freq: Three times a day (TID) | INTRAMUSCULAR | Status: DC | PRN
  Administered 2014-10-26: 5 mg via INTRAVENOUS
  Filled 2014-10-26: qty 2

## 2014-10-26 NOTE — Progress Notes (Signed)
DISCHARGE INSTRUCTIONS GIVEN. NO DIARRHEA OR VOMITING SINCE ADMISSION. IV'S D/C'D. PT ALERT AND ORIENTED. NAUSEA RESOLVED. PAIN IS LESS BUT STILL PRESENT. PT DISCHARGED HOME ACCOMPANIED BY FAMILY.

## 2014-10-26 NOTE — Discharge Summary (Signed)
Physician Discharge Summary  Bethany Barton GNF:[id number] DOB: 12/11/1970 DOA: 10/25/2014  PCP: Kirstie Peri, MD  Admit date: 10/25/2014 Discharge date: 10/26/2014  Time spent: 45 minutes  Recommendations for Outpatient Follow-up:  -Will be discharged home today. -Advised to follow-up with her primary care provider in 2 weeks for blood pressure check. -Has been advised to discontinue use of lisinopril and hydrochlorothiazide until further evaluated by PCP.   Discharge Diagnoses:  Active Problems:   Nausea and vomiting   Anxiety   AKI (acute kidney injury)   Diarrhea   Discharge Condition: Stable and improved  Filed Weights   10/25/14 1557 10/25/14 1944 10/26/14 0500  Weight: 73.936 kg (163 lb) 73.936 kg (163 lb) 82.2 kg (181 lb 3.5 oz)    History of present illness:  Today presents to the hospital with chief complaint of diarrhea which has been ongoing for past 7 days. Patient was diagnosed with right foot cellulitis 3 weeks ago and was initially prescribed Augmentin, which was changed after 3 days to Cipro and clindamycin as the foot infection was not getting better. After completing the antibiotic patient was again prescribed Rocephin for 3 days for presumed sore throat and later prescribed amoxicillin for 10 days. After patient took her last antibiotic on Friday last week she developed diarrhea. Patient also takes chlorthalidone, lisinopril, Catapres, amlodipine for hypertension. In the meantime patient also switched her primary care providers and saw Dr. Sherwood Gambler, who changed her Catapres to .2 mg 3 times a day. Patient today had about 40 loose bowel movements. She also complained of nausea and vomiting In the ED patient was found to be profoundly hypotensive and has required 5 L of normal saline, lactic acid is normal 1.3. Patient is mentating well. Lab work revealed acute kidney injury with creatinine of 4.04, baseline is 0.71 as of 09/30/2014. Stool sample has been obtained for  possible C. difficile. Patient empirically started on IV Flagyl Patient also complained of right shoulder pain after she fell from her bed this morning, the x-ray of the right shoulder is negative for any acute fracture.  Hospital Course:   Acute renal failure -Suspect related to dehydration from vomiting and diarrhea along with hypotension caused from blood pressure medications. -Creatinine has gone from 4.04 on admission down to a normal of 0.8 today. -Have recommended cessation of chlorthalidone and lisinopril until further evaluated by PCP.  Hypotension with history of hypertension -Hypotension has resolved, blood pressure steadily increasing is 130/80 at time of discharge. -Given acute renal failure have recommended cessation of lisinopril and chlorthalidone for now but will continue amlodipine and clonidine to prevent rebound hypertension.  -Blood pressure will need to be followed at hospital follow-up appointment.  Diarrhea -No further episodes since admission. -C. difficile DCR is negative.  Nausea and vomiting -Etiology unclear, does not complain of further nausea and in fact once her diet increased.   Procedures:  None   Consultations:  None  Discharge Instructions  Discharge Instructions    Diet - low sodium heart healthy    Complete by:  As directed      Increase activity slowly    Complete by:  As directed             Medication List    STOP taking these medications        Armodafinil 200 MG Tabs  Commonly known as:  NUVIGIL     chlorthalidone 25 MG tablet  Commonly known as:  HYGROTON     gabapentin  400 MG capsule  Commonly known as:  NEURONTIN     lisinopril 20 MG tablet  Commonly known as:  PRINIVIL,ZESTRIL     naproxen 500 MG tablet  Commonly known as:  NAPROSYN     nortriptyline 25 MG capsule  Commonly known as:  PAMELOR     traMADol 50 MG tablet  Commonly known as:  ULTRAM      TAKE these medications        amLODipine 10 MG  tablet  Commonly known as:  NORVASC  Take 10 mg by mouth daily.     clonazePAM 0.5 MG tablet  Commonly known as:  KLONOPIN  Take 0.5 mg by mouth 2 (two) times daily.     cloNIDine 0.2 MG tablet  Commonly known as:  CATAPRES  Take 0.2 mg by mouth 3 (three) times daily.     cyclobenzaprine 10 MG tablet  Commonly known as:  FLEXERIL  Take 1 tablet (10 mg total) by mouth 2 (two) times daily as needed for muscle spasms.     eletriptan 40 MG tablet  Commonly known as:  RELPAX  Take 1 tablet (40 mg total) by mouth as needed for migraine or headache. One tablet by mouth at onset of headache. May repeat in 2 hours if headache persists or recurs.     FOLTANX 3-35-2 MG Tabs  Take 1 tablet by mouth 2 (two) times daily.     metoCLOPramide 10 MG tablet  Commonly known as:  REGLAN  Take 1 tablet (10 mg total) by mouth every 6 (six) hours as needed for nausea or vomiting.     multivitamin with minerals Tabs tablet  Take 1 tablet by mouth daily.     ondansetron 8 MG tablet  Commonly known as:  ZOFRAN  Take 1 tablet (8 mg total) by mouth every 8 (eight) hours as needed for nausea or vomiting.     oxyCODONE 5 MG immediate release tablet  Commonly known as:  ROXICODONE  Take 2 tablets (10 mg total) by mouth every 6 (six) hours as needed for breakthrough pain.       Allergies  Allergen Reactions  . Iodine Other (See Comments)    Blisters  . Neomycin Other (See Comments)    Blisters   . Nsaids Other (See Comments)    Due to gastric bypass.  [name] [Promethazine Hcl] Other (See Comments)    Altered Mental Status  . Sulfa Antibiotics Nausea And Vomiting       Follow-up Information    Follow up with [facility], MD. Schedule an appointment as soon as possible for a visit in 2 weeks.   Specialty:  Internal Medicine   Contact information:   [address]  [place] [address] [phone]        The results of significant diagnostics from this hospitalization (including  imaging, microbiology, ancillary and laboratory) are listed below for reference.    Significant Diagnostic Studies: Dg Shoulder Right  10/25/2014   CLINICAL DATA:  Right shoulder pain. Larey Seat out of bed this morning landing on the right arm.  EXAM: RIGHT SHOULDER - 2+ VIEW  COMPARISON:  None.  FINDINGS: There is no evidence of fracture or dislocation. There is no evidence of arthropathy or other focal bone abnormality. Soft tissues are unremarkable.  IMPRESSION: Negative.   Electronically Signed   By: Burman Nieves M.D.   On: 10/25/2014 18:19   Dg Ankle Complete Right  10/09/2014   CLINICAL DATA:  Severe pain  in the right foot and ankle. Previous ankle joint effusion. Peripheral neuropathy.  EXAM: RIGHT ANKLE - COMPLETE 3+ VIEW  COMPARISON:  Foot radiographs dated 11/13/2013  FINDINGS: There is solid fusion of the talus and tibia. 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 No acute osseous abnormality. Old fractures of the distal tibia and fibula have healed. Arthritic changes between the distal tibia and fibula.  IMPRESSION: No acute abnormalities.   Electronically Signed   By: Francene Boyers M.D.   On: 10/09/2014 20:34   Dg Chest Port 1 View  10/25/2014   CLINICAL DATA:  Code Sepsis. Generalized abd pain, vomiting, diarrhea, hypotension since this AM. History of diabetes, HTN.  EXAM: PORTABLE CHEST - 1 VIEW  COMPARISON:  09/30/2014  FINDINGS: Cardiac silhouette normal in size and configuration. Normal mediastinal and hilar contours. Clear lungs. No pleural effusion or pneumothorax.  Bony thorax is unremarkable.  IMPRESSION: No active disease.   Electronically Signed   By: Amie Portland M.D.   On: 10/25/2014 20:24   Dg Abd Acute W/chest  09/30/2014   CLINICAL DATA:  Abdominal pain, nausea and vomiting for the past 3 days.  EXAM: DG ABDOMEN ACUTE W/ 1V CHEST  COMPARISON:  None.  FINDINGS: Normal sized heart. Clear lungs. There are scattered air-fluid levels in normal caliber right  colon and distal small bowel. No free peritoneal air. Cholecystectomy clips. Lower thoracic spine degenerative changes.  IMPRESSION: Scattered air-fluid levels in normal caliber bowel loops. This can be seen with gastroenteritis.   Electronically Signed   By: Beckie Salts M.D.   On: 09/30/2014 21:28   Dg Foot Complete Right  10/09/2014   CLINICAL DATA:  Right foot ankle pain.  EXAM: RIGHT FOOT COMPLETE - 3+ VIEW  COMPARISON:  11/13/2013  FINDINGS: There is no evidence of fracture or dislocation. There is evidence of prior tibiotalar arthrodesis with solid osseous bridging. There is severe degenerative changes of the posterior subtalar joint. Soft tissues are unremarkable.  IMPRESSION: No acute osseous injury of the right foot.   Electronically Signed   By: Elige Ko   On: 10/09/2014 20:33    Microbiology: Recent Results (from the past 240 hour(s))  C difficile quick scan w PCR reflex     Status: None   Collection Time: 10/25/14  8:37 PM  Result Value Ref Range Status   C Diff antigen NEGATIVE NEGATIVE Final   C Diff toxin NEGATIVE NEGATIVE Final   C Diff interpretation Negative for toxigenic C. difficile  Final  MRSA PCR Screening     Status: None   Collection Time: 10/25/14 11:52 PM  Result Value Ref Range Status   MRSA by PCR NEGATIVE NEGATIVE Final    Comment:        The GeneXpert MRSA Assay (FDA approved for NASAL specimens only), is one component of a comprehensive MRSA colonization surveillance program. It is not intended to diagnose MRSA infection nor to guide or monitor treatment for MRSA infections.      Labs: Basic Metabolic Panel:  Recent Labs Lab 10/25/14 1714 10/26/14 0705  NA 134* 139  K 4.0 3.7  CL 103 106  CO2 19* 25  GLUCOSE 102* 79  BUN 46* 19  CREATININE 4.04* 0.80  CALCIUM 8.2* 8.2*   Liver Function Tests:  Recent Labs Lab 10/25/14 1714 10/26/14 0705  AST 15 12*  ALT 19 17  ALKPHOS 104 92  BILITOT 0.6 0.7  PROT 6.2* 5.6*  ALBUMIN 3.4*  3.0*  Recent Labs Lab 10/25/14 1714  LIPASE 22   No results for input(s): AMMONIA in the last 168 hours. CBC:  Recent Labs Lab 10/25/14 1714 10/26/14 0705  WBC 10.9* 6.4  NEUTROABS 8.3*  --   HGB 13.1 12.0  HCT 39.9 36.4  MCV 91.9 91.0  PLT 223 197   Cardiac Enzymes: No results for input(s): CKTOTAL, CKMB, CKMBINDEX, TROPONINI in the last 168 hours. BNP: BNP (last 3 results) No results for input(s): BNP in the last 8760 hours.  ProBNP (last 3 results) No results for input(s): PROBNP in the last 8760 hours.  CBG: No results for input(s): GLUCAP in the last 168 hours.     SignedChaya Jan  [facility] Pager: [phone] 10/26/2014, 9:20 AM

## 2014-10-26 NOTE — ED Provider Notes (Signed)
CSN: [id number]     Arrival date & time 10/25/14  1550 History   First MD Initiated Contact with Patient 10/25/14 1641     Chief Complaint  Patient presents with  . Abdominal Pain     (Consider location/radiation/quality/duration/timing/severity/associated sxs/prior Treatment) HPI Comments: 44 year old female with past medical history including type 2 diabetes mellitus, diabetic neuropathy, polysubstance abuse, migraines, depression who presents with vomiting and diarrhea. The patient states that 6 days ago she began having nonbloody diarrhea that persisted for several days but stopped 2 days ago. It seemed to resolve and then restarted again. She reports that she has multiple episodes per day and has had accidents in her bed overnight. She reports that it smells like C. 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 She began vomiting today and has had multiple episodes of vomiting and decreased by mouth intake. She endorses chills but denies any fevers. She endorses recent antibiotic use for cellulitis of her right leg including Augmentin, ciprofloxacin, and clindamycin. She endorses mild headache which she states is normal for her because she has a history of migraines. She reports generalized abdominal pain. No chest pain or shortness of breath.  Patient is a 44 y.o. female presenting with abdominal pain. The history is provided by the patient.  Abdominal Pain   Past Medical History  Diagnosis Date  . Hypertension   . Peripheral neuropathy   . Facial tic   . Diabetes mellitus without complication   . Polysubstance abuse     etoh, opiates  . Migraines   . Depression   . Anxiety   . Osteoarthritis   . Neuropathic pain of both feet     chronic   Past Surgical History  Procedure Laterality Date  . 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 Abdominal hysterectomy  2013  . Ankle fusion Right 1991  . Appendectomy  1987  .  Cesarean section  1997  . Abdominoplasty  2013  . Cholecystectomy    . Incision and drainage abscess N/A 09/25/2013    Procedure: INCISION AND DRAINAGE ABSCESS;  Surgeon: Cherylynn Ridges, MD;  Location: [facility] OR;  Service: General;  Laterality: N/A;  . Cholecystectomy     Family History  Problem Relation Age of Onset  . Bipolar disorder Sister   . Drug abuse Sister   . Schizophrenia Son   . Bipolar disorder Father   . Alcohol abuse Father   . Drug abuse Mother   . Alcohol abuse Mother    Social History  Substance Use Topics  . Smoking status: Never Smoker   . Smokeless tobacco: Never Used  . Alcohol Use: No     Comment: Denies alcohol since last June. /history of cocaine and narcotic dependence 07/25/13   OB History    No data available     Review of Systems  Gastrointestinal: Positive for abdominal pain.    10 Systems reviewed and are negative for acute change except as noted in the HPI.   Allergies  Iodine; Neomycin; Nsaids; Phenergan; and Sulfa antibiotics  Home Medications   Prior to Admission medications   Medication Sig Start Date End Date Taking? Authorizing Provider  amLODipine (NORVASC) 10 MG tablet Take 10 mg by mouth daily.   Yes Historical Provider, MD  chlorthalidone (HYGROTON) 25 MG tablet Take 25 mg by mouth 2 (two) times daily. 09/05/14  Yes Historical Provider, MD  clonazePAM (KLONOPIN) 0.5 MG  tablet Take 0.5 mg by mouth 2 (two) times daily. 09/13/14  Yes Historical Provider, MD  cloNIDine (CATAPRES) 0.2 MG tablet Take 0.2 mg by mouth 3 (three) times daily.   Yes Historical Provider, MD  eletriptan (RELPAX) 40 MG tablet Take 1 tablet (40 mg total) by mouth as needed for migraine or headache. One tablet by mouth at onset of headache. May repeat in 2 hours if headache persists or recurs. 11/12/13  Yes Levert Feinstein, MD  L-Methylfolate-B6-B12 (FOLTANX) 3-35-2 MG TABS Take 1 tablet by mouth 2 (two) times daily. 10/16/14  Yes Historical Provider, MD  lisinopril  (PRINIVIL,ZESTRIL) 20 MG tablet Take 20 mg by mouth 2 (two) times daily.   Yes Historical Provider, MD  Multiple Vitamin (MULTIVITAMIN WITH MINERALS) TABS tablet Take 1 tablet by mouth daily.   Yes Historical Provider, MD  ondansetron (ZOFRAN) 4 MG tablet Take 4 mg by mouth every 8 (eight) hours as needed for nausea or vomiting.   Yes Historical Provider, MD  oxyCODONE (ROXICODONE) 5 MG immediate release tablet Take 2 tablets (10 mg total) by mouth every 6 (six) hours as needed for breakthrough pain. 10/12/14  Yes [name], MD  Oxycodone HCl 10 MG TABS Take 10 mg by mouth every 3 (three) hours as needed (for pain).  10/16/14  Yes Historical Provider, MD  Armodafinil (NUVIGIL) 200 MG TABS Take 200 mg by mouth daily. Patient not taking: Reported on 09/30/2014 05/15/14   Levert Feinstein, MD  cyclobenzaprine (FLEXERIL) 10 MG tablet Take 1 tablet (10 mg total) by mouth 2 (two) times daily as needed for muscle spasms. Patient not taking: Reported on 09/30/2014 07/20/14   Janne Napoleon, NP  gabapentin (NEURONTIN) 400 MG capsule Take 1 capsule (400 mg total) by mouth 6 (six) times daily. Patient not taking: Reported on 10/25/2014 11/12/13   Levert Feinstein, MD  metoCLOPramide (REGLAN) 10 MG tablet Take 1 tablet (10 mg total) by mouth every 6 (six) hours as needed for nausea or vomiting. Patient not taking: Reported on 10/09/2014 09/30/14   Samuel Jester, DO  naproxen (NAPROSYN) 500 MG tablet Take 1 tablet (500 mg total) by mouth 2 (two) times daily with a meal. Patient not taking: Reported on 07/20/2014 05/20/14   Tammy Triplett, PA-C  nortriptyline (PAMELOR) 25 MG capsule 3 tabs po qhs xone week, then 4 tabs po qhs Patient not taking: Reported on 10/25/2014 11/23/13   Levert Feinstein, MD  traMADol (ULTRAM) 50 MG tablet Take 1 tablet (50 mg total) by mouth every 6 (six) hours as needed. Patient not taking: Reported on 09/30/2014 08/06/14   Felicie Morn, NP   BP 116/77 mmHg  Pulse 70  Temp(Src) 98.8 F (37.1 C) (Oral)  Resp 17  Ht 5'  3" (1.6 m)  Wt 163 lb (73.936 kg)  BMI 28.88 kg/m2  SpO2 99% Physical Exam  Constitutional: She is oriented to person, place, and time.  Awake, alert, ill-appearing but nontoxic and in no acute distress  HENT:  Head: Normocephalic and atraumatic.  dry mucous membranes  Eyes: Conjunctivae are normal. Pupils are equal, round, and reactive to light.  Neck: Neck supple.  Cardiovascular: Normal rate, regular rhythm and normal heart sounds.   No murmur heard. Pulmonary/Chest: Effort normal and breath sounds normal.  Abdominal: Soft. Bowel sounds are normal. She exhibits no distension.  Mild diffuse tenderness to palpation without rebound or guarding, no peritonitis  Musculoskeletal: She exhibits no edema.  Neurological: She is alert and oriented to person, place, and time.  Fluent speech  Skin: Skin is warm and dry.  Scars on R lower leg w/ no erythema  Psychiatric: She has a normal mood and affect. Judgment normal.  Nursing note and vitals reviewed.   ED Course  Procedures (including critical care time) Labs Review Labs Reviewed  COMPREHENSIVE METABOLIC PANEL - Abnormal; Notable for the following:    Sodium 134 (*)    CO2 19 (*)    Glucose, Bld 102 (*)    BUN 46 (*)    Creatinine, Ser 4.04 (*)    Calcium 8.2 (*)    Total Protein 6.2 (*)    Albumin 3.4 (*)    GFR calc non Af Amer 13 (*)    GFR calc Af Amer 15 (*)    All other components within normal limits  CBC WITH DIFFERENTIAL/PLATELET - Abnormal; Notable for the following:    WBC 10.9 (*)    Neutro Abs 8.3 (*)    All other components within normal limits  URINALYSIS, ROUTINE W REFLEX MICROSCOPIC (NOT AT [facility]) - Abnormal; Notable for the following:    Hgb urine dipstick SMALL (*)    All other components within normal limits  URINE MICROSCOPIC-ADD ON - Abnormal; Notable for the following:    Squamous Epithelial / LPF FEW (*)    All other components within normal limits  C DIFFICILE QUICK SCREEN W PCR REFLEX  URINE  CULTURE  CULTURE, BLOOD (ROUTINE X 2)  CULTURE, BLOOD (ROUTINE X 2)  MRSA PCR SCREENING  LIPASE, BLOOD  PREGNANCY, URINE  LACTIC ACID, PLASMA  LACTIC ACID, PLASMA  CBC  CREATININE, SERUM  CBC  COMPREHENSIVE METABOLIC PANEL    Imaging Review Dg Shoulder Right  10/25/2014   CLINICAL DATA:  Right shoulder pain. Larey Seat out of bed this morning landing on the right arm.  EXAM: RIGHT SHOULDER - 2+ VIEW  COMPARISON:  None.  FINDINGS: There is no evidence of fracture or dislocation. There is no evidence of arthropathy or other focal bone abnormality. Soft tissues are unremarkable.  IMPRESSION: Negative.   Electronically Signed   By: Burman Nieves M.D.   On: 10/25/2014 18:19   Dg Chest Port 1 View  10/25/2014   CLINICAL DATA:  Code Sepsis. Generalized abd pain, vomiting, diarrhea, hypotension since this AM. History of diabetes, HTN.  EXAM: PORTABLE CHEST - 1 VIEW  COMPARISON:  09/30/2014  FINDINGS: Cardiac silhouette normal in size and configuration. Normal mediastinal and hilar contours. Clear lungs. No pleural effusion or pneumothorax.  Bony thorax is unremarkable.  IMPRESSION: No active disease.   Electronically Signed   By: Amie Portland M.D.   On: 10/25/2014 20:24   I have personally reviewed and evaluated these lab results as part of my medical decision-making.   EKG Interpretation None      MDM   Final diagnoses:  None   diarrhea Hypotension Vomiting Acute kidney injury  44 year old female who presents with almost 1 week of diarrhea in the setting of recent antibiotic use as well as vomiting and decreased by mouth intake for the past day. At presentation, the patient was awake, alert, ill-appearing but nontoxic and in no acute distress. Idol signs notable for hypotension at 76/44. Dry mucous membranes on exam. Place the patient on contact precautions and sent above labwork including C. difficile studies. Gave the patient 2 L of IV fluids and later continued to give the patient  fluid boluses due to persistent hypotension.  Labs notable for creatinine of 4.04; patient's creatinine is  usually normal. WBC 10.9, UA normal. Because the patient remained hypertensive after 3 L of fluids, initiated a code sepsis with blood and urine cultures, lactate, and chest x-ray. UA, lactate, and chest x-ray unremarkable. I suspect that the patient's hypotension is largely due to hypovolemia. On reexamination, I spoke with the patient and she stated that her physician had recently increased her clonidine; last dose was at 5 AM this morning. It is possible that this medication changes contributing to her hypotension without tachycardia. I have given patient a dose of zosyn for intraabdominal infection sepsis coverage as well as Flagyl for c diff coverage. Pt admitted to general medicine for further care.  CRITICAL CARE Performed by: Ambrose Finland Mckyle Solanki   Total critical care time: 45 minutes  Critical care time was exclusive of separately billable procedures and treating other patients.  Critical care was necessary to treat or prevent imminent or life-threatening deterioration.  Critical care was time spent personally by me on the following activities: development of treatment plan with patient and/or surrogate as well as nursing, discussions with consultants, evaluation of patient's response to treatment, examination of patient, obtaining history from patient or surrogate, ordering and performing treatments and interventions, ordering and review of laboratory studies, ordering and review of radiographic studies, pulse oximetry and re-evaluation of patient's condition.     Laurence Spates, MD 10/26/14 [phone]

## 2014-10-28 LAB — URINE CULTURE
Culture: NO GROWTH
Special Requests: NORMAL

## 2014-10-30 ENCOUNTER — Emergency Department (HOSPITAL_COMMUNITY): Payer: 59

## 2014-10-30 ENCOUNTER — Inpatient Hospital Stay (HOSPITAL_COMMUNITY)
Admission: EM | Admit: 2014-10-30 | Discharge: 2014-11-03 | DRG: 389 | Disposition: A | Discharge status: Home / Self Care | Payer: 59 | Attending: Family Medicine | Admitting: Family Medicine

## 2014-10-30 ENCOUNTER — Encounter (HOSPITAL_COMMUNITY): Payer: Self-pay | Admitting: *Deleted

## 2014-10-30 DIAGNOSIS — F419 Anxiety disorder, unspecified: Secondary | ICD-10-CM | POA: Diagnosis present

## 2014-10-30 DIAGNOSIS — E2740 Unspecified adrenocortical insufficiency: Secondary | ICD-10-CM | POA: Diagnosis not present

## 2014-10-30 DIAGNOSIS — E1140 Type 2 diabetes mellitus with diabetic neuropathy, unspecified: Secondary | ICD-10-CM | POA: Diagnosis present

## 2014-10-30 DIAGNOSIS — IMO0002 Reserved for concepts with insufficient information to code with codable children: Secondary | ICD-10-CM | POA: Diagnosis present

## 2014-10-30 DIAGNOSIS — R112 Nausea with vomiting, unspecified: Secondary | ICD-10-CM | POA: Diagnosis not present

## 2014-10-30 DIAGNOSIS — K2970 Gastritis, unspecified, without bleeding: Secondary | ICD-10-CM | POA: Diagnosis present

## 2014-10-30 DIAGNOSIS — Z818 Family history of other mental and behavioral disorders: Secondary | ICD-10-CM | POA: Diagnosis not present

## 2014-10-30 DIAGNOSIS — K2990 Gastroduodenitis, unspecified, without bleeding: Secondary | ICD-10-CM | POA: Diagnosis present

## 2014-10-30 DIAGNOSIS — R1013 Epigastric pain: Secondary | ICD-10-CM | POA: Diagnosis not present

## 2014-10-30 DIAGNOSIS — K567 Ileus, unspecified: Principal | ICD-10-CM | POA: Diagnosis present

## 2014-10-30 DIAGNOSIS — I10 Essential (primary) hypertension: Secondary | ICD-10-CM | POA: Diagnosis not present

## 2014-10-30 DIAGNOSIS — Z90710 Acquired absence of both cervix and uterus: Secondary | ICD-10-CM

## 2014-10-30 DIAGNOSIS — K56609 Unspecified intestinal obstruction, unspecified as to partial versus complete obstruction: Secondary | ICD-10-CM | POA: Diagnosis present

## 2014-10-30 DIAGNOSIS — I959 Hypotension, unspecified: Secondary | ICD-10-CM | POA: Diagnosis present

## 2014-10-30 DIAGNOSIS — Z811 Family history of alcohol abuse and dependence: Secondary | ICD-10-CM | POA: Diagnosis not present

## 2014-10-30 DIAGNOSIS — F1120 Opioid dependence, uncomplicated: Secondary | ICD-10-CM | POA: Diagnosis present

## 2014-10-30 DIAGNOSIS — F329 Major depressive disorder, single episode, unspecified: Secondary | ICD-10-CM | POA: Diagnosis present

## 2014-10-30 DIAGNOSIS — Z9884 Bariatric surgery status: Secondary | ICD-10-CM | POA: Diagnosis not present

## 2014-10-30 DIAGNOSIS — E1165 Type 2 diabetes mellitus with hyperglycemia: Secondary | ICD-10-CM | POA: Diagnosis not present

## 2014-10-30 DIAGNOSIS — K5669 Other intestinal obstruction: Secondary | ICD-10-CM | POA: Diagnosis not present

## 2014-10-30 DIAGNOSIS — M1990 Unspecified osteoarthritis, unspecified site: Secondary | ICD-10-CM | POA: Diagnosis present

## 2014-10-30 DIAGNOSIS — K259 Gastric ulcer, unspecified as acute or chronic, without hemorrhage or perforation: Secondary | ICD-10-CM | POA: Diagnosis not present

## 2014-10-30 LAB — CBC WITH DIFFERENTIAL/PLATELET
BASOS ABS: 0 10*3/uL (ref 0.0–0.1)
Basophils Relative: 0 % (ref 0–1)
Eosinophils Absolute: 0.2 10*3/uL (ref 0.0–0.7)
Eosinophils Relative: 1 % (ref 0–5)
HEMATOCRIT: 45.8 % (ref 36.0–46.0)
HEMOGLOBIN: 15.8 g/dL — AB (ref 12.0–15.0)
LYMPHS ABS: 2.8 10*3/uL (ref 0.7–4.0)
LYMPHS PCT: 25 % (ref 12–46)
MCH: 30.7 pg (ref 26.0–34.0)
MCHC: 34.5 g/dL (ref 30.0–36.0)
MCV: 89.1 fL (ref 78.0–100.0)
Monocytes Absolute: 0.9 10*3/uL (ref 0.1–1.0)
Monocytes Relative: 8 % (ref 3–12)
NEUTROS ABS: 7.2 10*3/uL (ref 1.7–7.7)
NEUTROS PCT: 66 % (ref 43–77)
PLATELETS: 307 10*3/uL (ref 150–400)
RBC: 5.14 MIL/uL — AB (ref 3.87–5.11)
RDW: 12.7 % (ref 11.5–15.5)
WBC: 11.1 10*3/uL — AB (ref 4.0–10.5)

## 2014-10-30 LAB — URINALYSIS, ROUTINE W REFLEX MICROSCOPIC
BILIRUBIN URINE: NEGATIVE
GLUCOSE, UA: NEGATIVE mg/dL
Ketones, ur: NEGATIVE mg/dL
Leukocytes, UA: NEGATIVE
Nitrite: NEGATIVE
Protein, ur: NEGATIVE mg/dL
SPECIFIC GRAVITY, URINE: 1.02 (ref 1.005–1.030)
UROBILINOGEN UA: 0.2 mg/dL (ref 0.0–1.0)
pH: 7 (ref 5.0–8.0)

## 2014-10-30 LAB — URINE MICROSCOPIC-ADD ON

## 2014-10-30 LAB — COMPREHENSIVE METABOLIC PANEL
ALK PHOS: 113 U/L (ref 38–126)
ALT: 26 U/L (ref 14–54)
AST: 19 U/L (ref 15–41)
Albumin: 4.5 g/dL (ref 3.5–5.0)
Anion gap: 10 (ref 5–15)
BUN: 17 mg/dL (ref 6–20)
CALCIUM: 9.4 mg/dL (ref 8.9–10.3)
CHLORIDE: 98 mmol/L — AB (ref 101–111)
CO2: 30 mmol/L (ref 22–32)
CREATININE: 0.74 mg/dL (ref 0.44–1.00)
Glucose, Bld: 73 mg/dL (ref 65–99)
Potassium: 3.5 mmol/L (ref 3.5–5.1)
Sodium: 138 mmol/L (ref 135–145)
Total Bilirubin: 0.3 mg/dL (ref 0.3–1.2)
Total Protein: 8.2 g/dL — ABNORMAL HIGH (ref 6.5–8.1)

## 2014-10-30 MED ORDER — ONDANSETRON HCL 4 MG PO TABS
4.0000 mg | ORAL_TABLET | Freq: Four times a day (QID) | ORAL | Status: DC | PRN
  Administered 2014-10-31 – 2014-11-02 (×4): 4 mg via ORAL
  Filled 2014-10-30 (×5): qty 1

## 2014-10-30 MED ORDER — ONDANSETRON HCL 4 MG/2ML IJ SOLN
4.0000 mg | Freq: Once | INTRAMUSCULAR | Status: AC
  Administered 2014-10-30: 4 mg via INTRAVENOUS
  Filled 2014-10-30: qty 2

## 2014-10-30 MED ORDER — HYDROMORPHONE HCL 1 MG/ML IJ SOLN
0.5000 mg | Freq: Once | INTRAMUSCULAR | Status: AC
  Administered 2014-10-30: 0.5 mg via INTRAVENOUS
  Filled 2014-10-30: qty 1

## 2014-10-30 MED ORDER — OXYCODONE HCL 5 MG PO TABS
10.0000 mg | ORAL_TABLET | Freq: Four times a day (QID) | ORAL | Status: DC | PRN
  Administered 2014-10-30 – 2014-11-01 (×6): 10 mg via ORAL
  Filled 2014-10-30 (×7): qty 2

## 2014-10-30 MED ORDER — ELETRIPTAN HYDROBROMIDE 40 MG PO TABS
40.0000 mg | ORAL_TABLET | ORAL | Status: DC | PRN
  Filled 2014-10-30: qty 1

## 2014-10-30 MED ORDER — CLONAZEPAM 0.5 MG PO TABS
0.5000 mg | ORAL_TABLET | Freq: Two times a day (BID) | ORAL | Status: DC
  Administered 2014-10-30 – 2014-11-03 (×8): 0.5 mg via ORAL
  Filled 2014-10-30 (×8): qty 1

## 2014-10-30 MED ORDER — IOHEXOL 300 MG/ML  SOLN
50.0000 mL | Freq: Once | INTRAMUSCULAR | Status: AC | PRN
  Administered 2014-10-30: 50 mL via ORAL

## 2014-10-30 MED ORDER — AMLODIPINE BESYLATE 5 MG PO TABS
10.0000 mg | ORAL_TABLET | Freq: Every day | ORAL | Status: DC
  Administered 2014-10-30 – 2014-11-02 (×3): 10 mg via ORAL
  Filled 2014-10-30 (×4): qty 2

## 2014-10-30 MED ORDER — ONDANSETRON HCL 4 MG PO TABS: 8.0000 mg | ORAL_TABLET | Freq: Three times a day (TID) | ORAL | Status: DC | PRN

## 2014-10-30 MED ORDER — FOLTANX 3-35-2 MG PO TABS
1.0000 | ORAL_TABLET | Freq: Two times a day (BID) | ORAL | Status: DC
  Filled 2014-10-30 (×3): qty 1

## 2014-10-30 MED ORDER — ONDANSETRON HCL 4 MG/2ML IJ SOLN
4.0000 mg | Freq: Four times a day (QID) | INTRAMUSCULAR | Status: DC | PRN
  Administered 2014-10-30 – 2014-11-03 (×9): 4 mg via INTRAVENOUS
  Filled 2014-10-30 (×10): qty 2

## 2014-10-30 MED ORDER — SODIUM CHLORIDE 0.9 % IV BOLUS (SEPSIS)
1000.0000 mL | Freq: Once | INTRAVENOUS | Status: AC
  Administered 2014-10-30: 1000 mL via INTRAVENOUS

## 2014-10-30 MED ORDER — CLONIDINE HCL 0.2 MG PO TABS
0.2000 mg | ORAL_TABLET | Freq: Three times a day (TID) | ORAL | Status: DC
  Administered 2014-10-30 – 2014-11-02 (×5): 0.2 mg via ORAL
  Filled 2014-10-30 (×7): qty 1

## 2014-10-30 MED ORDER — MUPIROCIN 2 % EX OINT
TOPICAL_OINTMENT | Freq: Two times a day (BID) | CUTANEOUS | Status: DC
  Administered 2014-10-30 – 2014-11-03 (×8): via NASAL
  Filled 2014-10-30: qty 22

## 2014-10-30 MED ORDER — ENOXAPARIN SODIUM 40 MG/0.4ML ~~LOC~~ SOLN
40.0000 mg | SUBCUTANEOUS | Status: DC
  Administered 2014-10-31 – 2014-11-02 (×3): 40 mg via SUBCUTANEOUS
  Filled 2014-10-30 (×3): qty 0.4

## 2014-10-30 MED ORDER — COSYNTROPIN 0.25 MG IJ SOLR
0.2500 mg | Freq: Once | INTRAMUSCULAR | Status: AC
  Administered 2014-10-31: 0.25 mg via INTRAVENOUS
  Filled 2014-10-30: qty 0.25

## 2014-10-30 MED ORDER — ONDANSETRON HCL 4 MG/2ML IJ SOLN
4.0000 mg | Freq: Once | INTRAMUSCULAR | Status: AC
  Administered 2014-10-30: 4 mg via INTRAMUSCULAR
  Filled 2014-10-30: qty 2

## 2014-10-30 MED ORDER — IOHEXOL 300 MG/ML  SOLN
100.0000 mL | Freq: Once | INTRAMUSCULAR | Status: AC | PRN
  Administered 2014-10-30: 100 mL via INTRAVENOUS

## 2014-10-30 MED ORDER — POTASSIUM CHLORIDE IN NACL 20-0.9 MEQ/L-% IV SOLN
INTRAVENOUS | Status: DC
  Administered 2014-10-30 – 2014-11-01 (×4): via INTRAVENOUS

## 2014-10-30 NOTE — H&P (Signed)
[facility] History and Physical  [name] VHQ:[id number] DOB: 1970-06-05 DOA: 10/30/2014  Referring physician: ER PCP: Kirstie Peri, MD   Chief Complaint: Vomiting, abdominal discomfort.  HPI: Bethany Barton is a 44 y.o. female  This is a 44 year old lady who was recently admitted and discharged with acute renal failure with a creatinine reached above for an hypotension. She had had severe diarrhea which led to this hypovolemia. She was hydrated and seemed to do well although she says that even after discharge she has been vomiting. She has not eaten any food and has not opened her bowels since discharged just 4 days ago. She has no fever. She feels extremely tired/fatigued. She has diabetes. She has been told that she may have autonomic dysfunction. Evaluation in the emergency room with a CT abdominal scan is suggestive of small bowel obstruction. She denies any fever. She is now being admitted for further investigation and management.   Review of Systems:  Apart from the symptoms above, all systems negative.  Past Medical History  Diagnosis Date  . Hypertension   . Peripheral neuropathy   . Facial tic   . Diabetes mellitus without complication   . Polysubstance abuse     etoh, opiates  . Migraines   . Depression   . Anxiety   . Osteoarthritis   . Neuropathic pain of both feet     chronic   Past Surgical History  Procedure Laterality Date  . 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 Abdominal hysterectomy  2013  . Ankle fusion Right 1991  . Appendectomy  1987  . Cesarean section  1997  . Abdominoplasty  2013  . Cholecystectomy    . Incision and drainage abscess N/A 09/25/2013    Procedure: INCISION AND DRAINAGE ABSCESS;  Surgeon: Cherylynn Ridges, MD;  Location: [facility] OR;  Service: General;  Laterality: N/A;  . Cholecystectomy     Social History:  reports that she has never smoked. She has never used smokeless tobacco. She reports that she does not  drink alcohol or use illicit drugs.  Allergies  Allergen Reactions  . Iodine Other (See Comments)    Blisters  . Lyrica [Pregabalin]   . Neomycin Other (See Comments)    Blisters   . Nsaids Other (See Comments)    Due to gastric bypass.  [name] [Promethazine Hcl] Other (See Comments)    Altered Mental Status  . Sulfa Antibiotics Nausea And Vomiting    Family History  Problem Relation Age of Onset  . Bipolar disorder Sister   . Drug abuse Sister   . Schizophrenia Son   . Bipolar disorder Father   . Alcohol abuse Father   . Drug abuse Mother   . Alcohol abuse Mother     Prior to Admission medications   Medication Sig Start Date End Date Taking? Authorizing Provider  amLODipine (NORVASC) 10 MG tablet Take 10 mg by mouth daily.   Yes Historical Provider, MD  clonazePAM (KLONOPIN) 0.5 MG tablet Take 0.5 mg by mouth 2 (two) times daily. 09/13/14  Yes Historical Provider, MD  cloNIDine (CATAPRES) 0.2 MG tablet Take 0.2 mg by mouth 3 (three) times daily.   Yes Historical Provider, MD  eletriptan (RELPAX) 40 MG tablet Take 1 tablet (40 mg total) by mouth as needed for migraine or headache. One tablet by mouth at onset of headache. May repeat in 2 hours if headache persists or recurs. 11/12/13  Yes Yijun  Terrace Arabia, MD  L-Methylfolate-B6-B12 (FOLTANX) 3-35-2 MG TABS Take 1 tablet by mouth 2 (two) times daily. 10/16/14  Yes Historical Provider, MD  Multiple Vitamin (MULTIVITAMIN WITH MINERALS) TABS tablet Take 1 tablet by mouth daily.   Yes Historical Provider, MD  ondansetron (ZOFRAN) 8 MG tablet Take 1 tablet (8 mg total) by mouth every 8 (eight) hours as needed for nausea or vomiting. 10/26/14  Yes Estela Isaiah Blakes, MD  oxyCODONE (ROXICODONE) 5 MG immediate release tablet Take 2 tablets (10 mg total) by mouth every 6 (six) hours as needed for breakthrough pain. 10/12/14  Yes [name], MD  cyclobenzaprine (FLEXERIL) 10 MG tablet Take 1 tablet (10 mg total) by mouth 2 (two) times  daily as needed for muscle spasms. Patient not taking: Reported on 09/30/2014 07/20/14   Janne Napoleon, NP  metoCLOPramide (REGLAN) 10 MG tablet Take 1 tablet (10 mg total) by mouth every 6 (six) hours as needed for nausea or vomiting. Patient not taking: Reported on 10/09/2014 09/30/14   Samuel Jester, DO   Physical Exam: Filed Vitals:   10/30/14 1158 10/30/14 1624  BP: 132/98 158/94  Pulse: 88 84  Temp: 97.8 F (36.6 C)   TempSrc: Oral   Resp: 16 16  Height: 5\' 3"  (1.6 m)   Weight: 77.111 kg (170 lb)   SpO2: 100% 100%    Wt Readings from Last 3 Encounters:  10/30/14 77.111 kg (170 lb)  10/26/14 82.2 kg (181 lb 3.5 oz)  10/12/14 77.111 kg (170 lb)    General:  Appears anxious. She appears to be in some discomfort with her abdomen but she is not toxic or septic clinically. She is not actively vomiting at the present time. Eyes: PERRL, normal lids, irises & conjunctiva ENT: grossly normal hearing, lips & tongue Neck: no LAD, masses or thyromegaly Cardiovascular: RRR, no m/r/g. No LE edema. Telemetry: SR, no arrhythmias  Respiratory: CTA bilaterally, no w/r/r. Normal respiratory effort. Abdomen: Soft but mildly tender. Bowel sounds are heard and are within normal limits. There are no tinkling bowel sounds. Skin: no rash or induration seen on limited exam Musculoskeletal: grossly normal tone BUE/BLE Psychiatric: grossly normal mood and affect, speech fluent and appropriate Neurologic: grossly non-focal.          Labs on Admission:  Basic Metabolic Panel:  Recent Labs Lab 10/25/14 1714 10/26/14 0705 10/30/14 1238  NA 134* 139 138  K 4.0 3.7 3.5  CL 103 106 98*  CO2 19* 25 30  GLUCOSE 102* 79 73  BUN 46* 19 17  CREATININE 4.04* 0.80 0.74  CALCIUM 8.2* 8.2* 9.4   Liver Function Tests:  Recent Labs Lab 10/25/14 1714 10/26/14 0705 10/30/14 1238  AST 15 12* 19  ALT 19 17 26   ALKPHOS 104 92 113  BILITOT 0.6 0.7 0.3  PROT 6.2* 5.6* 8.2*  ALBUMIN 3.4* 3.0* 4.5     Recent Labs Lab 10/25/14 1714  LIPASE 22   No results for input(s): AMMONIA in the last 168 hours. CBC:  Recent Labs Lab 10/25/14 1714 10/26/14 0705 10/30/14 1238  WBC 10.9* 6.4 11.1*  NEUTROABS 8.3*  --  7.2  HGB 13.1 12.0 15.8*  HCT 39.9 36.4 45.8  MCV 91.9 91.0 89.1  PLT 223 197 307   Cardiac Enzymes: No results for input(s): CKTOTAL, CKMB, CKMBINDEX, TROPONINI in the last 168 hours.  BNP (last 3 results) No results for input(s): BNP in the last 8760 hours.  ProBNP (last 3 results) No results for  input(s): PROBNP in the last 8760 hours.  CBG: No results for input(s): GLUCAP in the last 168 hours.  Radiological Exams on Admission: Ct Abdomen Pelvis W Contrast  10/30/2014   CLINICAL DATA:  Upper abdominal pain and nausea 2 weeks.  EXAM: CT ABDOMEN AND PELVIS WITH CONTRAST  TECHNIQUE: Multidetector CT imaging of the abdomen and pelvis was performed using the standard protocol following bolus administration of intravenous contrast.  CONTRAST:  50mL OMNIPAQUE IOHEXOL 300 MG/ML SOLN, OMNIPAQUE IOHEXOL 300 MG/ML SOLN  COMPARISON:  None.  FINDINGS: Lung bases are normal.  There is a small hiatal hernia.  Abdominal images demonstrate evidence of patient's gastric bypass procedure. The gastric-jejunal anastomosis patent. Mild mucosal enhancement and minimal wall thickening involving the native stomach.  Evidence of previous cholecystectomy. The pancreas, liver, spleen and adrenal glands are normal. Surgical absence of the appendix. Kidneys are normal in size, shape and position without hydronephrosis or nephrolithiasis. Ureters are normal.  Minimal calcified plaque over the distal abdominal aorta and common iliac arteries.  The colon is within normal. There is minimal prominence of a few small bowel loops in the left mid abdomen. There is no free fluid or focal inflammatory change. Mesenteries within normal.  Pelvic images demonstrate the bladder and rectum to be normal. There  is surgical absence of the uterus. Remaining bones and soft tissues are unremarkable.  IMPRESSION: Evidence patient's gastric bypass surgery with patent gastrojejunal anastomosis. There are a few mildly dilated small bowel loops in the left mid abdomen which may be normal postoperative change although can be seen due to ileus or an early/ partial obstructive process.  Mild mucosal enhancement and minimal wall thickening of the native stomach which may be due to gastritis.  Small hiatal hernia.   Electronically Signed   By: Elberta Fortis M.D.   On: 10/30/2014 14:51     Assessment/Plan   1. Small bowel obstruction. This is a possible diagnosis on the CT scan. However, I'm not clinically convinced of this. Dr. Lovell Sheehan, surgery, is aware of the patient and will see the patient while morning. In the meantime, will keep the patient nothing by mouth except for medications and give her IV fluids. 2. Hypotension/fluctuating blood pressure. She appears to have labile blood pressure. She showed me some blood work and she has a speckled pattern of ANA with high titers. I wonder if she has some sort of autoimmune disease. The other thought was the possibility of Addison's disease. I will order an ACTH stimulation test. 3.    Hypertension. Appears to be stable at the present time. 4.     Anxiety. 5.     History of opiate dependence in the past. 6.      Diabetes. Appears to be diet controlled at the present time. Sliding scale of insulin.    Further recommendations will depend on patient's hospital progress.   Code Status: Full code.   DVT Prophylaxis: Lovenox.  Family Communication: I discussed the plan with the patient at the bedside.   Disposition Plan: Home when medically stable.   Time spent: 60 minutes.  Wilson Singer [facility] Pager [phone].

## 2014-10-30 NOTE — ED Provider Notes (Signed)
CSN: [id number]     Arrival date & time 10/30/14  1152 History  This chart was scribed for Bethann Berkshire, MD by Gwenyth Ober, ED Scribe. This patient was seen in room [facility] and the patient's care was started at 12:20 PM.    Chief Complaint  Patient presents with  . Abdominal Pain   Patient is a 44 y.o. female presenting with abdominal pain. The history is provided by the patient. No language interpreter was used.  Abdominal Pain Pain location:  Generalized Pain quality: aching   Pain radiates to:  Does not radiate Pain severity:  Moderate Onset quality:  Gradual Timing:  Constant Progression:  Unchanged Chronicity:  New Relieved by:  Nothing Worsened by:  Nothing tried Associated symptoms: vomiting   Associated symptoms: no fever   Risk factors: recent hospitalization     HPI Comments: CRYSTEN KAMAN is a 44 y.o. female with a history of HTN, DM, polysubstance abuse, depression and anxiety who presents to the Emergency Department complaining of constant, moderate abdominal pain that started 10 days ago. Pt states vomiting, decreased urine and pain in her buttocks secondary to injections as associated symptoms. She also notes decreased fluid intake in the last five days. Pt was last seen in the ED on 8/26 for vomiting and diarrhea. She was admitted to the hospital for unspecified hypotension and diarrhea. Pt reports that she has followed up with her PCP who referred her to the ED for continuing symptoms. Per pt, PCP has treated her with IM Zofran, Solu-medrol, Rocephin this week. She denies fever.  Past Medical History  Diagnosis Date  . Hypertension   . Peripheral neuropathy   . Facial tic   . Diabetes mellitus without complication   . Polysubstance abuse     etoh, opiates  . Migraines   . Depression   . Anxiety   . Osteoarthritis   . Neuropathic pain of both feet     chronic   Past Surgical History  Procedure Laterality Date  . Gastric bypass N/A 04/2010    lost  150 ibs post surgury  . Abdominal hysterectomy  2013  . Ankle fusion Right 1991  . Appendectomy  1987  . Cesarean section  1997  . Abdominoplasty  2013  . Cholecystectomy    . Incision and drainage abscess N/A 09/25/2013    Procedure: INCISION AND DRAINAGE ABSCESS;  Surgeon: Cherylynn Ridges, MD;  Location: [facility] OR;  Service: General;  Laterality: N/A;  . Cholecystectomy     Family History  Problem Relation Age of Onset  . Bipolar disorder Sister   . Drug abuse Sister   . Schizophrenia Son   . Bipolar disorder Father   . Alcohol abuse Father   . Drug abuse Mother   . Alcohol abuse Mother    Social History  Substance Use Topics  . Smoking status: Never Smoker   . Smokeless tobacco: Never Used  . Alcohol Use: No     Comment: Denies alcohol since last June. /history of cocaine and narcotic dependence 07/25/13   OB History    No data available     Review of Systems  Constitutional: Negative for fever.  Gastrointestinal: Positive for vomiting and abdominal pain.  All other systems reviewed and are negative.  Allergies  Iodine; Lyrica; Neomycin; Nsaids; Phenergan; and Sulfa antibiotics  Home Medications   Prior to Admission medications   Medication Sig Start Date End Date Taking? Authorizing Provider  amLODipine (NORVASC) 10 MG tablet Take 10  mg by mouth daily.   Yes Historical Provider, MD  clonazePAM (KLONOPIN) 0.5 MG tablet Take 0.5 mg by mouth 2 (two) times daily. 09/13/14  Yes Historical Provider, MD  cloNIDine (CATAPRES) 0.2 MG tablet Take 0.2 mg by mouth 3 (three) times daily.   Yes Historical Provider, MD  eletriptan (RELPAX) 40 MG tablet Take 1 tablet (40 mg total) by mouth as needed for migraine or headache. One tablet by mouth at onset of headache. May repeat in 2 hours if headache persists or recurs. 11/12/13  Yes Levert Feinstein, MD  L-Methylfolate-B6-B12 (FOLTANX) 3-35-2 MG TABS Take 1 tablet by mouth 2 (two) times daily. 10/16/14  Yes Historical Provider, MD  Multiple  Vitamin (MULTIVITAMIN WITH MINERALS) TABS tablet Take 1 tablet by mouth daily.   Yes Historical Provider, MD  ondansetron (ZOFRAN) 8 MG tablet Take 1 tablet (8 mg total) by mouth every 8 (eight) hours as needed for nausea or vomiting. 10/26/14  Yes Estela Isaiah Blakes, MD  oxyCODONE (ROXICODONE) 5 MG immediate release tablet Take 2 tablets (10 mg total) by mouth every 6 (six) hours as needed for breakthrough pain. 10/12/14  Yes [name], MD  cyclobenzaprine (FLEXERIL) 10 MG tablet Take 1 tablet (10 mg total) by mouth 2 (two) times daily as needed for muscle spasms. Patient not taking: Reported on 09/30/2014 07/20/14   Janne Napoleon, NP  metoCLOPramide (REGLAN) 10 MG tablet Take 1 tablet (10 mg total) by mouth every 6 (six) hours as needed for nausea or vomiting. Patient not taking: Reported on 10/09/2014 09/30/14   Samuel Jester, DO   BP 132/98 mmHg  Pulse 88  Temp(Src) 97.8 F (36.6 C) (Oral)  Resp 16  Ht  (1.6 m)  Wt 170 lb (77.111 kg)  BMI 30.12 kg/m2  SpO2 100% Physical Exam  Constitutional: She is oriented to person, place, and time. She appears well-developed.  HENT:  Head: Normocephalic.  Dry mucuos membranes  Eyes: Conjunctivae and EOM are normal. No scleral icterus.  Neck: Neck supple. No thyromegaly present.  Cardiovascular: Normal rate and regular rhythm.  Exam reveals no gallop and no friction rub.   No murmur heard. Pulmonary/Chest: No stridor. She has no wheezes. She has no rales. She exhibits no tenderness.  Abdominal: She exhibits no distension. There is tenderness. There is no rebound.  Moderate generalized abdominal tenderness  Musculoskeletal: Normal range of motion. She exhibits no edema.  Lymphadenopathy:    She has no cervical adenopathy.  Neurological: She is oriented to person, place, and time. She exhibits normal muscle tone. Coordination normal.  Skin: No rash noted. No erythema.  Psychiatric: She has a normal mood and affect. Her behavior is  normal.  Nursing note and vitals reviewed.   ED Course  Procedures   DIAGNOSTIC STUDIES: Oxygen Saturation is 100% on RA, normal by my interpretation.    COORDINATION OF CARE: 12:27 PM Discussed treatment plan with pt which includes IV fluids, CT Abdomen Pelvis and lab work. Pt agreed to plan.  Labs Review Labs Reviewed - No data to display  Imaging Review No results found.   EKG Interpretation None      MDM   Final diagnoses:  None    abd pain,  Possible sbo,  admitt to medicine with surgery consult   Bethann Berkshire, MD 10/30/14 1626

## 2014-10-30 NOTE — ED Notes (Signed)
Pt states generalized abdominal pain since recent admission here at [facility].

## 2014-10-31 DIAGNOSIS — R112 Nausea with vomiting, unspecified: Secondary | ICD-10-CM

## 2014-10-31 DIAGNOSIS — K2990 Gastroduodenitis, unspecified, without bleeding: Secondary | ICD-10-CM

## 2014-10-31 DIAGNOSIS — K2970 Gastritis, unspecified, without bleeding: Secondary | ICD-10-CM | POA: Insufficient documentation

## 2014-10-31 DIAGNOSIS — K5669 Other intestinal obstruction: Secondary | ICD-10-CM

## 2014-10-31 DIAGNOSIS — K567 Ileus, unspecified: Secondary | ICD-10-CM

## 2014-10-31 LAB — CBC
HCT: 41 % (ref 36.0–46.0)
HEMOGLOBIN: 13.6 g/dL (ref 12.0–15.0)
MCH: 30 pg (ref 26.0–34.0)
MCHC: 33.2 g/dL (ref 30.0–36.0)
MCV: 90.5 fL (ref 78.0–100.0)
PLATELETS: 241 10*3/uL (ref 150–400)
RBC: 4.53 MIL/uL (ref 3.87–5.11)
RDW: 12.8 % (ref 11.5–15.5)
WBC: 8.5 10*3/uL (ref 4.0–10.5)

## 2014-10-31 LAB — CULTURE, BLOOD (ROUTINE X 2): CULTURE: NO GROWTH

## 2014-10-31 LAB — COMPREHENSIVE METABOLIC PANEL
ALT: 22 U/L (ref 14–54)
ANION GAP: 9 (ref 5–15)
AST: 22 U/L (ref 15–41)
Albumin: 3.7 g/dL (ref 3.5–5.0)
Alkaline Phosphatase: 91 U/L (ref 38–126)
BUN: 13 mg/dL (ref 6–20)
CHLORIDE: 103 mmol/L (ref 101–111)
CO2: 28 mmol/L (ref 22–32)
CREATININE: 0.66 mg/dL (ref 0.44–1.00)
Calcium: 9.2 mg/dL (ref 8.9–10.3)
Glucose, Bld: 95 mg/dL (ref 65–99)
POTASSIUM: 4.3 mmol/L (ref 3.5–5.1)
SODIUM: 140 mmol/L (ref 135–145)
Total Bilirubin: 0.5 mg/dL (ref 0.3–1.2)
Total Protein: 6.6 g/dL (ref 6.5–8.1)

## 2014-10-31 LAB — ACTH STIMULATION, 3 TIME POINTS
CORTISOL 60 MIN: 14.1 ug/dL
Cortisol, 30 Min: 11.2 ug/dL
Cortisol, Base: 0.8 ug/dL

## 2014-10-31 MED ORDER — MORPHINE SULFATE (PF) 2 MG/ML IV SOLN
1.0000 mg | INTRAVENOUS | Status: DC | PRN
  Administered 2014-10-31 – 2014-11-02 (×6): 1 mg via INTRAVENOUS
  Filled 2014-10-31 (×6): qty 1

## 2014-10-31 MED ORDER — ACETAMINOPHEN 325 MG PO TABS
650.0000 mg | ORAL_TABLET | Freq: Four times a day (QID) | ORAL | Status: DC | PRN
  Administered 2014-11-01 – 2014-11-03 (×6): 650 mg via ORAL
  Filled 2014-10-31 (×6): qty 2

## 2014-10-31 MED ORDER — L-METHYLFOLATE-B6-B12 3-35-2 MG PO TABS
1.0000 | ORAL_TABLET | Freq: Every day | ORAL | Status: DC
  Administered 2014-10-31 – 2014-11-03 (×4): 1 via ORAL
  Filled 2014-10-31 (×5): qty 1

## 2014-10-31 MED ORDER — PANTOPRAZOLE SODIUM 40 MG IV SOLR
40.0000 mg | INTRAVENOUS | Status: DC
  Administered 2014-10-31 – 2014-11-02 (×3): 40 mg via INTRAVENOUS
  Filled 2014-10-31 (×3): qty 40

## 2014-10-31 NOTE — Care Management Note (Signed)
Case Management Note  Patient Details  Name: Bethany Barton MRN: [id number] Date of Birth: 1971/02/15  Subjective/Objective:                  Pt admitted from home with possible SBO. Pt lives with family and will return home at discharge. Pt is fairly independent with ADl's.   Action/Plan: No CM needs noted.  Expected Discharge Date:                  Expected Discharge Plan:  Home/Self Care  In-House Referral:  NA  Discharge planning Services  CM Consult  Post Acute Care Choice:  NA Choice offered to:  NA  DME Arranged:    DME Agency:     HH Arranged:    HH Agency:     Status of Service:  Completed, signed off  Medicare Important Message Given:    Date Medicare IM Given:    Medicare IM give by:    Date Additional Medicare IM Given:    Additional Medicare Important Message give by:     If discussed at Long Length of Stay Meetings, dates discussed:    Additional Comments:  Cheryl Flash, RN 10/31/2014, 10:56 AM

## 2014-10-31 NOTE — Progress Notes (Signed)
PROGRESS NOTE  Bethany Barton UEA:[id number] DOB: 1971/02/21 DOA: 10/30/2014 PCP: Kirstie Peri, MD  Summary: 36 yof PMH DM type 2, recent admit for AKI, hypotension, and hypovolemia 8/26-8/27, presented with complaints of nausea, vomiting, and constipation. Evaluation in the emergency room with a CT abdominal scan suggested small bowel obstruction. Admitted for further investigation and management.  Assessment/Plan: 1. Ileus with abdominal pain. Revealed on CT scan. General surgery has consulted and believes that there is no need for surgical intervention, recommends GI consult.  2. Nausea, vomiting, and constipation secondary to Ileus. 3. Hypotension/fluctuating blood pressure. Possible autoimmune disease? Speckled pattern of ANA with high titers per H&P. ACTH stim test ordered. 4. DM Type 2, diet controlled with associated neuropathy. SSI 5. History of polysubstance abuse. Per chart 6. Anxiety, depression    Recurrent pain and nausea, will back off diet.   Consult GI for further recommendations.  Continue IVF and pain medications as need.   Code Status: FULL DVT prophylaxis: Lovenox Family Communication: Discussed with patient who understands and has no concerns at this time. Disposition Plan: Anticipate discharge within 1-2 days  Brendia Sacks, MD  [facility]  Pager [phone] If 7PM-7AM, please contact night-coverage at [email or web address], password [facility] 10/31/2014, 12:53 PM  LOS: 1 day   Consultants:  General surgery- Dr Lovell Sheehan  Procedures:    Antibiotics:    HPI/Subjective: Was feeling better but after attempting a liquid diet has increasing upper gastric area pain rated at 8-9/10. Last bowel movement noted was 10/25/14. Nausea but no relief with Zofran but no vomiting.   Has been on several rounds of Augmentin>cipro>clindomycin>rocephin>amoxolcilin due to infection peripheral neuropathy.   Objective: Filed Vitals:   10/30/14 1756 10/30/14 2132 10/31/14 0609  10/31/14 0913  BP: 130/76 115/82 94/57 95/65   Pulse: 65 66    Temp: 98.3 F (36.8 C)  98.2 F (36.8 C)   TempSrc:   Oral   Resp: 16 20 20    Height: 5\' 3"  (1.6 m)     Weight: 75.388 kg (166 lb 3.2 oz)     SpO2: 95% 97% 95%    No intake or output data in the 24 hours ending 10/31/14 1253   Filed Weights   10/30/14 1158 10/30/14 1756  Weight: 77.111 kg (170 lb) 75.388 kg (166 lb 3.2 oz)    Exam:  Afebrile General:  Appears calm and comfortable Eyes: PERRL, normal lids, irises & conjunctiva ENT: grossly normal hearing, lips & tongue Neck: no LAD, masses or thyromegaly Cardiovascular: RRR, no m/r/g. No LE edema. Respiratory: CTA bilaterally, no w/r/r. Normal respiratory effort. Abdomen: soft, nd, generally tender especially upper gastric. Positive bowel sounds.  Skin: no rash or induration noted. Musculoskeletal: grossly normal tone BUE/BLE Psychiatric: grossly normal mood and affect, speech fluent and appropriate  New data reviewed:  BMP, CBC, and CMP all unremarkable   Pertinent data since admission:  CT A/P revealed small midly dilated small bowel loops in the left abdomen which may be normal postoperative change although although can be seen due to ileus or an early/ partial obstructive process. Mild mucosal enhancement and minimal wall thickening of the native stomach which may be due to gastritis.  Pending data:  ACTH stim test  Scheduled Meds: . amLODipine  10 mg Oral Daily  . clonazePAM  0.5 mg Oral BID  . cloNIDine  0.2 mg Oral TID  . enoxaparin (LOVENOX) injection  40 mg Subcutaneous Q24H  . l-methylfolate-B6-B12  1 tablet Oral Daily  . mupirocin ointment  Nasal BID   Continuous Infusions: . 0.9 % NaCl with KCl 20 mEq / L 75 mL/hr at 10/31/14 1610    Principal Problem:   Ileus Active Problems:   Opioid dependence   DM type 2, uncontrolled, with neuropathy   Essential hypertension, benign   Anxiety   Nausea with vomiting   Time spent 30  minutes  I, [name], acting as scribe, recorded this note contemporaneously in the presence of Dr. Melton Alar. Irene Limbo, M.D. on 10/31/2014 .  I have reviewed the above documentation for accuracy and completeness, and I agree with the above. Brendia Sacks, MD

## 2014-10-31 NOTE — Consult Note (Signed)
Reason for Consult: Nausea, partial small bowel obstruction Referring Physician: Hospitalist  Bethany Barton is an 44 y.o. female.  HPI: Patient is a 44 year old white female status post gastric bypass surgery for morbid obesity who subtotally has lost 200 pounds. She has been given our hospital recently with upper abdominal pain and discomfort, nausea, and renal insufficiency. A CT scan the abdomen was done yesterday which revealed thickened gastric wall and some mildly dilated loops of small bowel in the left upper quadrant. There was no frank bowel obstruction appreciated. These episodes have been intermittent in nature. It was also noted in the past that she was having diarrhea and suffered from hypokalemia.  Past Medical History  Diagnosis Date  . Hypertension   . Peripheral neuropathy   . Facial tic   . Diabetes mellitus without complication   . Polysubstance abuse     etoh, opiates  . Migraines   . Depression   . Anxiety   . Osteoarthritis   . Neuropathic pain of both feet     chronic    Past Surgical History  Procedure Laterality Date  . 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 Abdominal hysterectomy  2013  . Ankle fusion Right 1991  . Appendectomy  1987  . Cesarean section  1997  . Abdominoplasty  2013  . Cholecystectomy    . Incision and drainage abscess N/A 09/25/2013    Procedure: INCISION AND DRAINAGE ABSCESS;  Surgeon: Gwenyth Ober, MD;  Location: [facility];  Service: General;  Laterality: N/A;  . Cholecystectomy      Family History  Problem Relation Age of Onset  . Bipolar disorder Sister   . Drug abuse Sister   . Schizophrenia Son   . Bipolar disorder Father   . Alcohol abuse Father   . Drug abuse Mother   . Alcohol abuse Mother     Social History:  reports that she has never smoked. She has never used smokeless tobacco. She reports that she does not drink alcohol or use illicit drugs.  Allergies:  Allergies  Allergen Reactions  .  Iodine Other (See Comments)    Blisters  . Lyrica [Pregabalin]   . Neomycin Other (See Comments)    Blisters   . Nsaids Other (See Comments)    Due to gastric bypass.  Bethany Barton [Promethazine Hcl] Other (See Comments)    Altered Mental Status  . Sulfa Antibiotics Nausea And Vomiting    Medications: I have reviewed the patient's current medications.  Results for orders placed or performed during the hospital encounter of 10/30/14 (from the past 48 hour(s))  CBC with Differential/Platelet     Status: Abnormal   Collection Time: 10/30/14 12:38 PM  Result Value Ref Range   WBC 11.1 (H) 4.0 - 10.5 K/uL   RBC 5.14 (H) 3.87 - 5.11 MIL/uL   Hemoglobin 15.8 (H) 12.0 - 15.0 g/dL   HCT 45.8 36.0 - 46.0 %   MCV 89.1 78.0 - 100.0 fL   MCH 30.7 26.0 - 34.0 pg   MCHC 34.5 30.0 - 36.0 g/dL   RDW 12.7 11.5 - 15.5 %   Platelets 307 150 - 400 K/uL   Neutrophils Relative % 66 43 - 77 %   Neutro Abs 7.2 1.7 - 7.7 K/uL   Lymphocytes Relative 25 12 - 46 %   Lymphs Abs 2.8 0.7 - 4.0 K/uL   Monocytes Relative 8 3 - 12 %   Monocytes  Absolute 0.9 0.1 - 1.0 K/uL   Eosinophils Relative 1 0 - 5 %   Eosinophils Absolute 0.2 0.0 - 0.7 K/uL   Basophils Relative 0 0 - 1 %   Basophils Absolute 0.0 0.0 - 0.1 K/uL  Comprehensive metabolic panel     Status: Abnormal   Collection Time: 10/30/14 12:38 PM  Result Value Ref Range   Sodium 138 135 - 145 mmol/L   Potassium 3.5 3.5 - 5.1 mmol/L   Chloride 98 (L) 101 - 111 mmol/L   CO2 30 22 - 32 mmol/L   Glucose, Bld 73 65 - 99 mg/dL   BUN 17 6 - 20 mg/dL   Creatinine, Ser 0.74 0.44 - 1.00 mg/dL   Calcium 9.4 8.9 - 10.3 mg/dL   Total Protein 8.2 (H) 6.5 - 8.1 g/dL   Albumin 4.5 3.5 - 5.0 g/dL   AST 19 15 - 41 U/L   ALT 26 14 - 54 U/L   Alkaline Phosphatase 113 38 - 126 U/L   Total Bilirubin 0.3 0.3 - 1.2 mg/dL   GFR calc non Af Amer >60 >60 mL/min   GFR calc Af Amer >60 >60 mL/min    Comment: (NOTE) The eGFR has been calculated using the CKD EPI  equation. This calculation has not been validated in all clinical situations. eGFR's persistently <60 mL/min signify possible Chronic Kidney Disease.    Anion gap 10 5 - 15  Urinalysis, Routine w reflex microscopic (not at [facility])     Status: Abnormal   Collection Time: 10/30/14  1:01 PM  Result Value Ref Range   Color, Urine YELLOW YELLOW   APPearance CLEAR CLEAR   Specific Gravity, Urine 1.020 1.005 - 1.030   pH 7.0 5.0 - 8.0   Glucose, UA NEGATIVE NEGATIVE mg/dL   Hgb urine dipstick MODERATE (A) NEGATIVE   Bilirubin Urine NEGATIVE NEGATIVE   Ketones, ur NEGATIVE NEGATIVE mg/dL   Protein, ur NEGATIVE NEGATIVE mg/dL   Urobilinogen, UA 0.2 0.0 - 1.0 mg/dL   Nitrite NEGATIVE NEGATIVE   Leukocytes, UA NEGATIVE NEGATIVE  Urine microscopic-add on     Status: Abnormal   Collection Time: 10/30/14  1:01 PM  Result Value Ref Range   Squamous Epithelial / LPF MANY (A) RARE   WBC, UA 3-6 <3 WBC/hpf   RBC / HPF 3-6 <3 RBC/hpf   Bacteria, UA RARE RARE  Comprehensive metabolic panel     Status: None   Collection Time: 10/31/14  6:00 AM  Result Value Ref Range   Sodium 140 135 - 145 mmol/L   Potassium 4.3 3.5 - 5.1 mmol/L    Comment: DELTA CHECK NOTED   Chloride 103 101 - 111 mmol/L   CO2 28 22 - 32 mmol/L   Glucose, Bld 95 65 - 99 mg/dL   BUN 13 6 - 20 mg/dL   Creatinine, Ser 0.66 0.44 - 1.00 mg/dL   Calcium 9.2 8.9 - 10.3 mg/dL   Total Protein 6.6 6.5 - 8.1 g/dL   Albumin 3.7 3.5 - 5.0 g/dL   AST 22 15 - 41 U/L   ALT 22 14 - 54 U/L   Alkaline Phosphatase 91 38 - 126 U/L   Total Bilirubin 0.5 0.3 - 1.2 mg/dL   GFR calc non Af Amer >60 >60 mL/min   GFR calc Af Amer >60 >60 mL/min    Comment: (NOTE) The eGFR has been calculated using the CKD EPI equation. This calculation has not been validated in all clinical situations.  eGFR's persistently <60 mL/min signify possible Chronic Kidney Disease.    Anion gap 9 5 - 15  CBC     Status: None   Collection Time: 10/31/14  6:00 AM   Result Value Ref Range   WBC 8.5 4.0 - 10.5 K/uL   RBC 4.53 3.87 - 5.11 MIL/uL   Hemoglobin 13.6 12.0 - 15.0 g/dL   HCT 41.0 36.0 - 46.0 %   MCV 90.5 78.0 - 100.0 fL   MCH 30.0 26.0 - 34.0 pg   MCHC 33.2 30.0 - 36.0 g/dL   RDW 12.8 11.5 - 15.5 %   Platelets 241 150 - 400 K/uL    Ct Abdomen Pelvis W Contrast  10/30/2014   CLINICAL DATA:  Upper abdominal pain and nausea 2 weeks.  EXAM: CT ABDOMEN AND PELVIS WITH CONTRAST  TECHNIQUE: Multidetector CT imaging of the abdomen and pelvis was performed using the standard protocol following bolus administration of intravenous contrast.  CONTRAST:  6m OMNIPAQUE IOHEXOL 300 MG/ML SOLN, 1031mOMNIPAQUE IOHEXOL 300 MG/ML SOLN  COMPARISON:  None.  FINDINGS: Lung bases are normal.  There is a small hiatal hernia.  Abdominal images demonstrate evidence of patient's gastric bypass procedure. The gastric-jejunal anastomosis patent. Mild mucosal enhancement and minimal wall thickening involving the native stomach.  Evidence of previous cholecystectomy. The pancreas, liver, spleen and adrenal glands are normal. Surgical absence of the appendix. Kidneys are normal in size, shape and position without hydronephrosis or nephrolithiasis. Ureters are normal.  Minimal calcified plaque over the distal abdominal aorta and common iliac arteries.  The colon is within normal. There is minimal prominence of a few small bowel loops in the left mid abdomen. There is no free fluid or focal inflammatory change. Mesenteries within normal.  Pelvic images demonstrate the bladder and rectum to be normal. There is surgical absence of the uterus. Remaining bones and soft tissues are unremarkable.  IMPRESSION: Evidence patient's gastric bypass surgery with patent gastrojejunal anastomosis. There are a few mildly dilated small bowel loops in the left mid abdomen which may be normal postoperative change although can be seen due to ileus or an early/ partial obstructive process.  Mild mucosal  enhancement and minimal wall thickening of the native stomach which may be due to gastritis.  Small hiatal hernia.   Electronically Signed   By: DaMarin Olp.D.   On: 10/30/2014 14:51    ROS: See chart Blood pressure 94/57, pulse 66, temperature 98.2 F (36.8 C), temperature source Oral, resp. rate 20, height 5' 3"  (1.6 m), weight 75.388 kg (166 lb 3.2 oz), SpO2 95 %. Physical Exam: Pleasant white female in no acute distress. Abdomen is soft with mild nonspecific tenderness in the left upper quadrant. No distention is noted. Bowel sounds are appreciated.  Assessment/Plan: Impression: Gastritis with possible mild ileus, though she has had diarrhea in the past. I doubt she has a surgical abdomen for which I can treat. She may need a GI consultation for further evaluation treatment. Will advance her to full liquid diet. Will follow with you.  Analea Muller A 10/31/2014, 9:15 AM
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
## 2014-11-01 DIAGNOSIS — E1165 Type 2 diabetes mellitus with hyperglycemia: Secondary | ICD-10-CM

## 2014-11-01 DIAGNOSIS — E1140 Type 2 diabetes mellitus with diabetic neuropathy, unspecified: Secondary | ICD-10-CM

## 2014-11-01 LAB — MAGNESIUM: MAGNESIUM: 1.9 mg/dL (ref 1.7–2.4)

## 2014-11-01 LAB — LIPASE, BLOOD: Lipase: 21 U/L — ABNORMAL LOW (ref 22–51)

## 2014-11-01 MED ORDER — OXYCODONE HCL 5 MG PO TABS
10.0000 mg | ORAL_TABLET | ORAL | Status: DC | PRN
  Administered 2014-11-01 – 2014-11-03 (×13): 10 mg via ORAL
  Filled 2014-11-01 (×15): qty 2

## 2014-11-01 MED ORDER — PREDNISONE 10 MG PO TABS
10.0000 mg | ORAL_TABLET | Freq: Every day | ORAL | Status: DC
  Administered 2014-11-02 – 2014-11-03 (×2): 10 mg via ORAL
  Filled 2014-11-01 (×2): qty 1

## 2014-11-01 NOTE — Progress Notes (Addendum)
PROGRESS NOTE  [name]:[id number] DOB: 15-Oct-1970 DOA: 10/30/2014 PCP: Kirstie Peri, MD  Summary: 63 yof PMH DM type 2, recent admit for AKI, hypotension, and hypovolemia 8/26-8/27, presented with complaints of nausea, vomiting, and constipation. Evaluation in the emergency room with a CT abdominal scan suggested small bowel obstruction. Admitted for further investigation and management.  Assessment/Plan: 1. Ileus with abdominal pain, possible gastritis. No significant change today. Lipase normal. Bowels are moving. Revealed on CT scan. General surgery has consulted and believes that there is no need for surgical intervention. GI consulted, recommends to continue current current treatments, liquid PO and possible inpatient EGD based on symptom status. 2. Nausea, vomiting, and constipation secondary to Ileus. Nausea improved with IV Zofran. Constipation resolved. 3. Hypotension/fluctuating blood pressure. BP normal. Possible autoimmune disease? Speckled pattern of ANA with high titers per H&P. ACTH stim test indeterminate. WIll d/w endocrinologist concerning results, she has outpatient follow-up arranged already, also has an appointment with rheumatology. 4. DM Type 2, diet controlled with associated neuropathy. Remain stable. SSI 5. History of polysubstance abuse. Per chart  6. Anxiety, depression    Continue IVF and pain medications as needed. Increase dosage frequency of oxycodone.  Follow up with GI recommendations.  Follow up with endocrinologist, Dr. Fransico Him.  Code Status: FULL DVT prophylaxis: Lovenox Family Communication: Discussed with patient who understands and has no concerns at this time. Disposition Plan: Anticipate discharge within 24 hours  Brendia Sacks, MD  [facility]  Pager [phone] If 7PM-7AM, please contact night-coverage at [email or web address], password [facility] 11/01/2014, 10:55 AM  LOS: 2 days   Consultants:  General surgery- Dr Lovell Sheehan  GI - Dr.  Jena Gauss  Procedures:    Antibiotics:    HPI/Subjective: Many questions. Wondering about hematuria. Abnormal bowel movement that was yellow in color. Nausea which was improved with IV Zofran. Difficulty with liquids, still has epigastric abdominal pain.  Objective: Filed Vitals:   10/31/14 0913 10/31/14 1430 10/31/14 2116 11/01/14 0551  BP: 95/65 107/60 145/88 132/76  Pulse:  66 66 53  Temp:  98.4 F (36.9 C)  97.3 F (36.3 C)  TempSrc:  Oral  Oral  Resp:  20 20 20   Height:      Weight:      SpO2:  100% 100% 100%    Intake/Output Summary (Last 24 hours) at 11/01/14 1055 Last data filed at 11/01/14 0900  Gross per 24 hour  Intake   2350 ml  Output      0 ml  Net   2350 ml     Filed Weights   10/30/14 1158 10/30/14 1756  Weight: 77.111 kg (170 lb) 75.388 kg (166 lb 3.2 oz)    Exam:  Afebrile General:  Appears calm and comfortable Cardiovascular: Regular rate and rhythm, no murmurs, rubs or gallops. No lower extremity edema. Respiratory: Clear to auscultation bilaterally, no wheezes, rales or rhonchi. Normal respiratory effort. Abdomen: soft, non distended, epigastric and lower abdominal tenderness to palpation. Positive bowel sounds.  Musculoskeletal: grossly normal tone BUE/BLE Psychiatric: grossly normal mood and affect, speech fluent and appropriate  New data reviewed:  Magnesium WNL  Lipase unremarkable  ACTH stimulation results indeterminate.  Pertinent data since admission:  CT A/P revealed small midly dilated small bowel loops in the left abdomen which may be normal postoperative change although although can be seen due to ileus or an early/ partial obstructive process. Mild mucosal enhancement and minimal wall thickening of the native stomach which may be due to gastritis.  ACTH  stim test. No normal range established.  Pending data:    Scheduled Meds: . amLODipine  10 mg Oral Daily  . clonazePAM  0.5 mg Oral BID  . cloNIDine  0.2 mg Oral TID   . enoxaparin (LOVENOX) injection  40 mg Subcutaneous Q24H  . l-methylfolate-B6-B12  1 tablet Oral Daily  . mupirocin ointment   Nasal BID  . pantoprazole (PROTONIX) IV  40 mg Intravenous Q24H   Continuous Infusions: . 0.9 % NaCl with KCl 20 mEq / L 75 mL/hr at 10/31/14 2223    Principal Problem:   Ileus Active Problems:   Opioid dependence   DM type 2, uncontrolled, with neuropathy   Essential hypertension, benign   Anxiety   Nausea with vomiting   Gastritis and gastroduodenitis   Time spent 30 minutes  I, Edman Circle, acting a scribe, recorded this note contemporaneously in the presence of Dr. Erick Blinks, M.D. on 11/01/2014 at 10:55 AM   I have reviewed the above documentation for accuracy and completeness, and I agree with the above. Brendia Sacks, MD

## 2014-11-01 NOTE — Care Management Note (Signed)
Case Management Note  Patient Details  Name: Bethany Barton MRN: [id number] Date of Birth: February 16, 1971   Expected Discharge Date:                  Expected Discharge Plan:  Home/Self Care  In-House Referral:  NA  Discharge planning Services  CM Consult  Post Acute Care Choice:  NA Choice offered to:  NA  DME Arranged:    DME Agency:     HH Arranged:    HH Agency:     Status of Service:  Completed, signed off  Medicare Important Message Given:    Date Medicare IM Given:    Medicare IM give by:    Date Additional Medicare IM Given:    Additional Medicare Important Message give by:     If discussed at Long Length of Stay Meetings, dates discussed:    Additional Comments: Pt discharging home today with self care. No CM needs.  Malcolm Metro, RN 11/01/2014, 2:44 PM

## 2014-11-01 NOTE — Progress Notes (Signed)
  Subjective: Still having problems advancing diet due to epigastric pain with eating. She did have a bowel movement. She has not had emesis.  Objective: Vital signs in last 24 hours: Temp:  [97.3 F (36.3 C)-98.4 F (36.9 C)] 97.3 F (36.3 C) (09/02 0551) Pulse Rate:  [53-66] 53 (09/02 0551) Resp:  [20] 20 (09/02 0551) BP: (107-145)/(60-88) 132/76 mmHg (09/02 0551) SpO2:  [100 %] 100 % (09/02 0551) Last BM Date: 10/25/14  Intake/Output from previous day: 09/01 0701 - 09/02 0700 In: 2470 [P.O.:1080; I.V.:1390] Out: -  Intake/Output this shift:    General appearance: alert, cooperative and no distress GI: Soft, nonspecific tenderness to deep palpation in the epigastric region. No distention noted. Bowel sounds present.  Lab Results:   Recent Labs  10/30/14 1238 10/31/14 0600  WBC 11.1* 8.5  HGB 15.8* 13.6  HCT 45.8 41.0  PLT 307 241   BMET  Recent Labs  10/30/14 1238 10/31/14 0600  NA 138 140  K 3.5 4.3  CL 98* 103  CO2 30 28  GLUCOSE 73 95  BUN 17 13  CREATININE 0.74 0.66  CALCIUM 9.4 9.2   PT/INR No results for input(s): LABPROT, INR in the last 72 hours.  Studies/Results: Ct Abdomen Pelvis W Contrast  10/30/2014   CLINICAL DATA:  Upper abdominal pain and nausea 2 weeks.  EXAM: CT ABDOMEN AND PELVIS WITH CONTRAST  TECHNIQUE: Multidetector CT imaging of the abdomen and pelvis was performed using the standard protocol following bolus administration of intravenous contrast.  CONTRAST:  50mL OMNIPAQUE IOHEXOL 300 MG/ML SOLN, OMNIPAQUE IOHEXOL 300 MG/ML SOLN  COMPARISON:  None.  FINDINGS: Lung bases are normal.  There is a small hiatal hernia.  Abdominal images demonstrate evidence of patient's gastric bypass procedure. The gastric-jejunal anastomosis patent. Mild mucosal enhancement and minimal wall thickening involving the native stomach.  Evidence of previous cholecystectomy. The pancreas, liver, spleen and adrenal glands are normal. Surgical absence of  the appendix. Kidneys are normal in size, shape and position without hydronephrosis or nephrolithiasis. Ureters are normal.  Minimal calcified plaque over the distal abdominal aorta and common iliac arteries.  The colon is within normal. There is minimal prominence of a few small bowel loops in the left mid abdomen. There is no free fluid or focal inflammatory change. Mesenteries within normal.  Pelvic images demonstrate the bladder and rectum to be normal. There is surgical absence of the uterus. Remaining bones and soft tissues are unremarkable.  IMPRESSION: Evidence patient's gastric bypass surgery with patent gastrojejunal anastomosis. There are a few mildly dilated small bowel loops in the left mid abdomen which may be normal postoperative change although can be seen due to ileus or an early/ partial obstructive process.  Mild mucosal enhancement and minimal wall thickening of the native stomach which may be due to gastritis.  Small hiatal hernia.   Electronically Signed   By: Elberta Fortis M.D.   On: 10/30/2014 14:51    Anti-infectives: Anti-infectives    None      Assessment/Plan: Impression: Epigastric pain most likely secondary to gastritis. Bowel obstruction less likely. Plan: We'll follow peripherally as she does not appear to have a bowel obstruction at this time.  LOS: 2 days    Hanley Rispoli A 11/01/2014

## 2014-11-02 DIAGNOSIS — R112 Nausea with vomiting, unspecified: Secondary | ICD-10-CM

## 2014-11-02 DIAGNOSIS — K5669 Other intestinal obstruction: Secondary | ICD-10-CM

## 2014-11-02 DIAGNOSIS — R1013 Epigastric pain: Secondary | ICD-10-CM

## 2014-11-02 MED ORDER — POLYETHYLENE GLYCOL 3350 17 G PO PACK
17.0000 g | PACK | Freq: Two times a day (BID) | ORAL | Status: DC
  Administered 2014-11-02 – 2014-11-03 (×3): 17 g via ORAL
  Filled 2014-11-02 (×3): qty 1

## 2014-11-02 MED ORDER — CLONIDINE HCL 0.2 MG PO TABS
0.2000 mg | ORAL_TABLET | Freq: Two times a day (BID) | ORAL | Status: DC
  Administered 2014-11-02 – 2014-11-03 (×2): 0.2 mg via ORAL
  Filled 2014-11-02 (×2): qty 1

## 2014-11-02 MED ORDER — SENNA 8.6 MG PO TABS
1.0000 | ORAL_TABLET | Freq: Every day | ORAL | Status: DC
  Administered 2014-11-02: 8.6 mg via ORAL
  Filled 2014-11-02: qty 1

## 2014-11-02 MED ORDER — CEFUROXIME AXETIL 250 MG PO TABS
500.0000 mg | ORAL_TABLET | Freq: Two times a day (BID) | ORAL | Status: DC
  Administered 2014-11-02 – 2014-11-03 (×2): 500 mg via ORAL
  Filled 2014-11-02 (×2): qty 2

## 2014-11-02 NOTE — Discharge Summary (Addendum)
Physician Discharge Summary  Bethany Barton ZOX:[id number] DOB: Aug 28, 1970 DOA: 10/30/2014  PCP: Kirstie Peri, MD  Admit date: 10/30/2014 Discharge date: 11/03/2014  Recommendations for Outpatient Follow-up:   Follow up with endocrinologist, Dr. Fransico Him, as scheduled. Started on Prednisone  daily for adrenal insufficiency  Norvasc on hold, clonidine reduced for low blood pressure  Referred to urology to persistent hematuria   Follow-up Information    Follow up with [facility] On 12/04/2014.   Why:  Hematuria at 9:45 am   Contact information:   [address], [address] [place] [phone]     Discharge Diagnoses:  1. Ileus  2. Nausea, vomiting, and constipation secondary to Ileus. 3. Adrenal insufficiency, acute vs chronic 4. DM Type 2, diet controlled with associated neuropathy. 5. Microscopic hematuria   Discharge Condition: Improved Disposition: Discharge home  Diet recommendation: Regular  Filed Weights   10/30/14 1158 10/30/14 1756  Weight: 77.111 kg (170 lb) 75.388 kg (166 lb 3.2 oz)    History of present illness:  33 yof PMH DM type 2, recent admit for AKI, hypotension, and hypovolemia 8/26-8/27, presented with complaints of nausea, vomiting, and constipation. Evaluation in the emergency room with a CT abdominal scan suggested small bowel obstruction. Admitted for further investigation and management.  Hospital Course:  Abdominal pain was treated with IVF and IV pain medications with improvement. Abdominal pain was found to be secondary to ileus  that was revealed on CT. Dr Lovell Sheehan, general surgery, evaluated and did not believe surgical intervention was necessary. GI performed EGD with results below and recommendations for patient to eat 6 small meals a day. Biopsy results are pending. Discussed indeterminate ACTH test results with Dr Fransico Him, endocrinologist, who recommended to start Prednisone  daily until her scheduled  appointment for further evaluation on possible adrenal insuffiencey. Instructed to follow up on her multiple follow up appointments. All other issues remained stable.   Individual issues as below:  1. Ileus with abdominal pain, possible gastritis, improved. Lipase normal. EGD 11/03/14 results and below. GI recommends that patient eats 6 small meals a day. Evaluated both by general surgery and GI. General surgery felt this represented ileus and recommended no further evaluation. 2. Nausea, vomiting, and constipation secondary to Ileus.Resolved, was able to tolerate liquids and some solids. Bowels are moving.  3. 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 Discussed with Dr. Fransico Him, who recommends Prednisone 10 mg daily until follow up appointment. Hypotension/fluctuating blood pressure. Discontinue most blood pressure medications until outpatient follow-up. Possible autoimmune disease? Speckled pattern of ANA with high titers per H&P. Has follow up with rheumatology  4. DM Type 2, diet controlled with associated neuropathy. Remaind stable. SSI 5. History of polysubstance abuse. Per chart  6. Anxiety, depression  Consultants:  General surgery- Dr Lovell Sheehan  GI - Dr. 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 No evidence of erosive esophagitis. Very small gastric pouch with gastritis and single erosion. Biopsy taken Patent gastrojejunostomy anastomosis and normal jejunal mucosa for up to 60 cm from the anastomosis  Antibiotics:  None   Discharge Instructions Discharge Instructions    Activity as tolerated - No restrictions    Complete by:  As directed      Diet general    Complete by:  As directed      Discharge instructions    Complete by:  As directed   Call your physician or seek immediate medical attention for  pain, vomiting, fever or worsening of condition.             Medication List    STOP taking these  medications        amLODipine 10 MG tablet  Commonly known as:  NORVASC     cyclobenzaprine 10 MG tablet  Commonly known as:  FLEXERIL     metoCLOPramide 10 MG tablet  Commonly known as:  REGLAN      TAKE these medications        clonazePAM 0.5 MG tablet  Commonly known as:  KLONOPIN  Take 0.5 mg by mouth 2 (two) times daily.     cloNIDine 0.2 MG tablet  Commonly known as:  CATAPRES  Take 1 tablet (0.2 mg total) by mouth 2 (two) times daily.     eletriptan 40 MG tablet  Commonly known as:  RELPAX  Take 1 tablet (40 mg total) by mouth as needed for migraine or headache. One tablet by mouth at onset of headache. May repeat in 2 hours if headache persists or recurs.     FOLTANX 3-35-2 MG Tabs  Take 1 tablet by mouth 2 (two) times daily.     multivitamin with minerals Tabs tablet  Take 1 tablet by mouth daily.     ondansetron 8 MG tablet  Commonly known as:  ZOFRAN  Take 1 tablet (8 mg total) by mouth every 8 (eight) hours as needed for nausea or vomiting.     oxyCODONE 5 MG immediate release tablet  Commonly known as:  ROXICODONE  Take 2 tablets (10 mg total) by mouth every 6 (six) hours as needed for severe pain.     polyethylene glycol packet  Commonly known as:  MIRALAX / GLYCOLAX  Take 17 g by mouth daily as needed for mild constipation.     predniSONE 10 MG tablet  Commonly known as:  DELTASONE  Take 1 tablet (10 mg total) by mouth daily with breakfast.         Allergies  Allergen Reactions  . Iodine Other (See Comments)    Blisters  . Lyrica [Pregabalin]   . Neomycin Other (See Comments)    Blisters   . Nsaids Other (See Comments)    Due to gastric bypass.  [name] [Promethazine Hcl] Other (See Comments)    Altered Mental Status  . Sulfa Antibiotics Nausea And Vomiting    The results of significant diagnostics from this hospitalization (including imaging, microbiology, ancillary and laboratory) are listed below for reference.    Significant  Diagnostic Studies: Ct Abdomen Pelvis W Contrast  10/30/2014   CLINICAL DATA:  Upper abdominal pain and nausea 2 weeks.  EXAM: CT ABDOMEN AND PELVIS WITH CONTRAST  TECHNIQUE: Multidetector CT imaging of the abdomen and pelvis was performed using the standard protocol following bolus administration of intravenous contrast.  CONTRAST:  50mL OMNIPAQUE IOHEXOL 300 MG/ML SOLN, OMNIPAQUE IOHEXOL 300 MG/ML SOLN  COMPARISON:  None.  FINDINGS: Lung bases are normal.  There is a small hiatal hernia.  Abdominal images demonstrate evidence of patient's gastric bypass procedure. The gastric-jejunal anastomosis patent. Mild mucosal enhancement and minimal wall thickening involving the native stomach.  Evidence of previous cholecystectomy. The pancreas, liver, spleen and adrenal glands are normal. Surgical absence of the appendix. Kidneys are normal in size, shape and position without hydronephrosis or nephrolithiasis. Ureters are normal.  Minimal calcified plaque over the distal abdominal aorta and common iliac arteries.  The colon is within normal. There is minimal  prominence of a few small bowel loops in the left mid abdomen. There is no free fluid or focal inflammatory change. Mesenteries within normal.  Pelvic images demonstrate the bladder and rectum to be normal. There is surgical absence of the uterus. Remaining bones and soft tissues are unremarkable.  IMPRESSION: Evidence patient's gastric bypass surgery with patent gastrojejunal anastomosis. There are a few mildly dilated small bowel loops in the left mid abdomen which may be normal postoperative change although can be seen due to ileus or an early/ partial obstructive process.  Mild mucosal enhancement and minimal wall thickening of the native stomach which may be due to gastritis.  Small hiatal hernia.   Electronically Signed   By: Elberta Fortis M.D.   On: 10/30/2014 14:51    Microbiology: Recent Results (from the past 240 hour(s))  C difficile quick scan  w PCR reflex     Status: None   Collection Time: 10/25/14  8:37 PM  Result Value Ref Range Status   C Diff antigen NEGATIVE NEGATIVE Final   C Diff toxin NEGATIVE NEGATIVE Final   C Diff interpretation Negative for toxigenic C. difficile  Final  Urine culture     Status: None   Collection Time: 10/25/14 10:50 PM  Result Value Ref Range Status   Specimen Description URINE, CLEAN CATCH  Final   Special Requests Normal  Final   Culture   Final    NO GROWTH 2 DAYS Performed at [facility]    Report Status 10/28/2014 FINAL  Final  MRSA PCR Screening     Status: None   Collection Time: 10/25/14 11:52 PM  Result Value Ref Range Status   MRSA by PCR NEGATIVE NEGATIVE Final    Comment:        The GeneXpert MRSA Assay (FDA approved for NASAL specimens only), is one component of a comprehensive MRSA colonization surveillance program. It is not intended to diagnose MRSA infection nor to guide or monitor treatment for MRSA infections.   Blood Culture (routine x 2)     Status: None   Collection Time: 10/26/14  7:05 AM  Result Value Ref Range Status   Specimen Description BLOOD LEFT ANTECUBITAL  Final   Special Requests BOTTLES DRAWN AEROBIC ONLY 6CC  Final   Culture NO GROWTH 5 DAYS  Final   Report Status 10/31/2014 FINAL  Final     Labs: Basic Metabolic Panel:  Recent Labs Lab 10/30/14 1238 10/31/14 0600 11/01/14 0520  NA 138 140  --   K 3.5 4.3  --   CL 98* 103  --   CO2 30 28  --   GLUCOSE 73 95  --   BUN 17 13  --   CREATININE 0.74 0.66  --   CALCIUM 9.4 9.2  --   MG  --   --  1.9   Liver Function Tests:  Recent Labs Lab 10/30/14 1238 10/31/14 0600  AST 19 22  ALT 26 22  ALKPHOS 113 91  BILITOT 0.3 0.5  PROT 8.2* 6.6  ALBUMIN 4.5 3.7    Recent Labs Lab 11/01/14 0520  LIPASE 21*   CBC:  Recent Labs Lab 10/30/14 1238 10/31/14 0600  WBC 11.1* 8.5  NEUTROABS 7.2  --   HGB 15.8* 13.6  HCT 45.8 41.0  MCV 89.1 90.5  PLT 307 241     Principal Problem:   Ileus Active Problems:   Opioid dependence   DM type 2, uncontrolled, with neuropathy  Essential hypertension, benign   Anxiety   Nausea with vomiting   Gastritis and gastroduodenitis   Time coordinating discharge: 35 minutes  Signed:  Brendia Sacks, MD [facility] 11/02/2014, 9:05 AM   I, Princella Pellegrini. Jari Pigg, acting as scribe, recorded this note contemporaneously in the presence of Dr. Melton Alar. Irene Limbo, M.D. on 11/02/2014 .  I have reviewed the above documentation for accuracy and completeness, and I agree with the above, which was updated 9/4. Brendia Sacks, MD

## 2014-11-02 NOTE — Progress Notes (Signed)
  Subjective: Patient does not feel any better. She does not have good appetite. She complains of nausea and epigastric pain. She has not vomited today. She hasn't had a bowel movement over 2 days but does not feel constipated. She was given 70 mg of polyethylene glycol. Patient states that she has been sick she has lost 15-16 pounds. Her illness started with cellulitis involving right foot and she was treated with multiple anti-biotics including clindamycin for 4 days ans stool c.diff by PCR was negative but tender 11 days ago. Patient states she received prednisone 10 mg this morning for newly diagnosed adrenocortical insufficiency.  Objective: Blood pressure 128/72, pulse 66, temperature 98.6 F (37 C), temperature source Oral, resp. rate 20, height  (1.6 m), weight 166 lb 3.2 oz (75.388 kg), SpO2 98 %. Patient is alert and in no acute distress. Conjunctiva is pink. Sclera is nonicteric. No neck masses noted. Abdomen is symmetrical. Bowel sounds are normal. On palpation abdomen is soft moderate midepigastric tenderness. No organomegaly or masses.  No LE edema or clubbing noted. She has extensive scarring at right ankle.  Labs/studies Results:   Recent Labs  10/31/14 0600  WBC 8.5  HGB 13.6  HCT 41.0  PLT 241    BMET   Recent Labs  10/31/14 0600  NA 140  K 4.3  CL 103  CO2 28  GLUCOSE 95  BUN 13  CREATININE 0.66  CALCIUM 9.2    LFT   Recent Labs  10/31/14 0600  PROT 6.6  ALBUMIN 3.7  AST 22  ALT 22  ALKPHOS 91  BILITOT 0.5    Abdominopelvic CT from 10/30/2014 reviewed     Assessment:  #1. Epigastric pain. Mucosal enhancement and wall thickening noted to residual stomach. Peptic ulcer disease needs to be ruled out. #2. Nausea and vomiting could be secondary to peptic ulcer disease or gastritis or secondary to adrenocortical insufficiency. Mild dilation of small bowel loops felt to be nonspecific and not indicated of small bowel obstruction. #3.  History of diarrhea which has now resolved but by mouth intake is poor. Given that she was treated with multiple anti-biotics including clindamycin one has to be concerned with C. difficile but stool test was negative but 10 days ago. She could have other enteric infection. #4. New diagnosis of adrenocortical insufficiency. Patient begun on prednisone as of this morning.  Recommendations:  GI pathogen panel only if stool is loose. Diagnostic esophagogastroduodenoscopy under monitored anesthesia care on 11/03/2014. Patient is not a candidate for EGD with conscious sedation.

## 2014-11-02 NOTE — Progress Notes (Signed)
Patient was alert and oriented not any duress noted to make decisions for Advance Directive.  Patient previously completed paperwork for Advance Directive.  Patient stated decided to complete another to appoint decision maker.  Non-related witnesses were along with Notary for signatures for the Healthcare Power of Attorney and Living Will.  Copy placed in chart and original given to patient.

## 2014-11-02 NOTE — Progress Notes (Addendum)
PROGRESS NOTE  Bethany Barton:[id number] DOB: 07-08-1970 DOA: 10/30/2014 PCP: Kirstie Peri, MD  Summary: 60 yof PMH DM type 2, recent admit for AKI, hypotension, and hypovolemia 8/26-8/27, presented with complaints of nausea, vomiting, and constipation. Evaluation in the emergency room with a CT abdominal scan suggested small bowel obstruction. Admitted for further investigation and management.  Assessment/Plan: 1. Ileus with abdominal pain, possible gastritis. Overall improving. Lipase normal. General surgery believes that the epigastric pain likely secondary to gastritis and has signed off. 2. Nausea, vomiting, and constipation secondary to Ileus. Some nausea, no vomiting, tolerating liquid. Improving.  3. 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 Discussed with Dr. Fransico Him, who recommends Prednisone 10 mg daily until follow up appointment. Hypotension/fluctuating blood pressure. Hold most blood pressure medications until outpatient follow-up. Possible autoimmune disease? Speckled pattern of ANA with high titers per H&P.   4. DM Type 2, diet controlled with associated neuropathy. Remaind stable. SSI 5. History of polysubstance abuse. Per chart  6. Anxiety, depression    Overall improving, advance diet   Start Prednisone  daily until scheduled outpatient appointments.  Follow up with GI recommendations.  Follow up with endocrinologist, Dr. Fransico Him, as scheduled and with rheumatology.  Code Status: FULL DVT prophylaxis: Lovenox Family Communication: Discussed with patient who understands and has no concerns at this time. Disposition Plan: Anticipate discharge today   Brendia Sacks, MD  [facility]  Pager [phone] If 7PM-7AM, please contact night-coverage at [email or web address], password [facility] 11/02/2014, 9:06 AM  LOS: 3 days   Consultants:  General surgery- Dr Lovell Sheehan  GI - Dr. Jena Gauss  Endocrinology- Dr Fransico Him  Procedures:    Antibiotics:    HPI/Subjective: Is doing  better today, nausea and pain are still present but improving. No bowel movement since yesterday. Tolerating liquids. No reports of chest pain, shortness of breath   Objective: Filed Vitals:   11/01/14 2225 11/02/14 0202 11/02/14 0543 11/02/14 0844  BP: 118/74 98/61 110/62 125/77  Pulse: 65 60 54 58  Temp: 97.7 F (36.5 C) 98.1 F (36.7 C) 98.7 F (37.1 C)   TempSrc: Oral Oral Oral   Resp: Height:      Weight:      SpO2: 100% 100% 100% 99%    Intake/Output Summary (Last 24 hours) at 11/02/14 0906 Last data filed at 11/02/14 0600  Gross per 24 hour  Intake 2288.75 ml  Output      0 ml  Net 2288.75 ml     Filed Weights   10/30/14 1158 10/30/14 1756  Weight: 77.111 kg (170 lb) 75.388 kg (166 lb 3.2 oz)    Exam:  Afebrile, VSS, not hypoxic General:  Appears calm and comfortable Cardiovascular: RRR, no m/r/g. No LE edema. Respiratory: CTA bilaterally, no w/r/r. Normal respiratory effort. Abdomen: soft, nt, nd, positive bowel sounds  Musculoskeletal: grossly normal tone BUE/BLE Psychiatric: grossly normal mood and affect, speech fluent and appropriate  New data reviewed:  No new data  Pertinent data since admission:  CT A/P revealed small midly dilated small bowel loops in the left abdomen which may be normal postoperative change although although can be seen due to ileus or an early/ partial obstructive process. Mild mucosal enhancement and minimal wall thickening of the native stomach which may be due to gastritis.  ACTH stim test. No normal range established.  Pending data:    Scheduled Meds: . amLODipine  10 mg Oral Daily  . clonazePAM  0.5 mg Oral BID  . cloNIDine  0.2 mg Oral TID  . enoxaparin (LOVENOX) injection  40 mg Subcutaneous Q24H  . l-methylfolate-B6-B12  1 tablet Oral Daily  . mupirocin ointment   Nasal BID  . pantoprazole (PROTONIX) IV  40 mg Intravenous Q24H  . predniSONE  10 mg Oral Q breakfast   Continuous Infusions: . 0.9 %  NaCl with KCl 20 mEq / L 75 mL/hr at 11/01/14 1155    Principal Problem:   Ileus Active Problems:   Opioid dependence   DM type 2, uncontrolled, with neuropathy   Essential hypertension, benign   Anxiety   Nausea with vomiting   Gastritis and gastroduodenitis  I, [name], acting as scribe, recorded this note contemporaneously in the presence of Dr. Melton Alar. Irene Limbo, M.D. on 11/02/2014 .  I have reviewed the above documentation for accuracy and completeness, and I agree with the above. Brendia Sacks, MD

## 2014-11-03 ENCOUNTER — Inpatient Hospital Stay (HOSPITAL_COMMUNITY): Payer: 59 | Admitting: Anesthesiology

## 2014-11-03 ENCOUNTER — Encounter (HOSPITAL_COMMUNITY)
Admission: EM | Disposition: A | Discharge status: Home / Self Care | Payer: Self-pay | Source: Home / Self Care | Attending: Family Medicine

## 2014-11-03 DIAGNOSIS — K259 Gastric ulcer, unspecified as acute or chronic, without hemorrhage or perforation: Secondary | ICD-10-CM

## 2014-11-03 DIAGNOSIS — K2970 Gastritis, unspecified, without bleeding: Secondary | ICD-10-CM

## 2014-11-03 DIAGNOSIS — Z9884 Bariatric surgery status: Secondary | ICD-10-CM

## 2014-11-03 DIAGNOSIS — E2740 Unspecified adrenocortical insufficiency: Secondary | ICD-10-CM

## 2014-11-03 HISTORY — PX: ESOPHAGOGASTRODUODENOSCOPY (EGD) WITH PROPOFOL: SHX5813

## 2014-11-03 HISTORY — PX: BIOPSY: SHX5522

## 2014-11-03 SURGERY — ESOPHAGOGASTRODUODENOSCOPY (EGD) WITH PROPOFOL
Anesthesia: Monitor Anesthesia Care | Site: Esophagus

## 2014-11-03 MED ORDER — OXYCODONE HCL 5 MG PO TABS: 5.0000 mg | ORAL_TABLET | Freq: Four times a day (QID) | ORAL | Status: DC | PRN

## 2014-11-03 MED ORDER — BUTAMBEN-TETRACAINE-BENZOCAINE 2-2-14 % EX AERO
INHALATION_SPRAY | CUTANEOUS | Status: DC | PRN
  Administered 2014-11-03: 2 via TOPICAL

## 2014-11-03 MED ORDER — FENTANYL CITRATE (PF) 100 MCG/2ML IJ SOLN
INTRAMUSCULAR | Status: DC | PRN
  Administered 2014-11-03 (×4): 25 ug via INTRAVENOUS

## 2014-11-03 MED ORDER — POLYETHYLENE GLYCOL 3350 17 G PO PACK: 17.0000 g | PACK | Freq: Every day | ORAL | Status: DC | PRN

## 2014-11-03 MED ORDER — PROPOFOL 10 MG/ML IV BOLUS
INTRAVENOUS | Status: AC
  Filled 2014-11-03: qty 40

## 2014-11-03 MED ORDER — PROPOFOL INFUSION 10 MG/ML OPTIME
INTRAVENOUS | Status: DC | PRN
  Administered 2014-11-03: 125 ug/kg/min via INTRAVENOUS

## 2014-11-03 MED ORDER — CLONIDINE HCL 0.2 MG PO TABS: 0.2000 mg | ORAL_TABLET | Freq: Two times a day (BID) | ORAL | Status: DC

## 2014-11-03 MED ORDER — FENTANYL CITRATE (PF) 100 MCG/2ML IJ SOLN
INTRAMUSCULAR | Status: AC
  Filled 2014-11-03: qty 2

## 2014-11-03 MED ORDER — MIDAZOLAM HCL 2 MG/2ML IJ SOLN
INTRAMUSCULAR | Status: AC
  Filled 2014-11-03: qty 2

## 2014-11-03 MED ORDER — MORPHINE SULFATE (PF) 2 MG/ML IV SOLN
2.0000 mg | Freq: Once | INTRAVENOUS | Status: AC
  Administered 2014-11-03: 2 mg via INTRAVENOUS
  Filled 2014-11-03: qty 1

## 2014-11-03 MED ORDER — ONDANSETRON HCL 4 MG/2ML IJ SOLN
INTRAMUSCULAR | Status: AC
  Filled 2014-11-03: qty 2

## 2014-11-03 MED ORDER — LIDOCAINE HCL (CARDIAC) 10 MG/ML IV SOLN
INTRAVENOUS | Status: DC | PRN
  Administered 2014-11-03: 50 mg via INTRAVENOUS

## 2014-11-03 MED ORDER — SUCCINYLCHOLINE CHLORIDE 20 MG/ML IJ SOLN
INTRAMUSCULAR | Status: AC
  Filled 2014-11-03: qty 1

## 2014-11-03 MED ORDER — PREDNISONE 10 MG PO TABS: 10.0000 mg | ORAL_TABLET | Freq: Every day | ORAL | Status: DC

## 2014-11-03 MED ORDER — MIDAZOLAM HCL 5 MG/5ML IJ SOLN
INTRAMUSCULAR | Status: DC | PRN
Start: 2014-11-03 — End: 2014-11-03
  Administered 2014-11-03 (×2): 1 mg via INTRAVENOUS

## 2014-11-03 MED ORDER — LACTATED RINGERS IV SOLN
INTRAVENOUS | Status: DC | PRN
  Administered 2014-11-03: 08:00:00 via INTRAVENOUS

## 2014-11-03 MED ORDER — OXYCODONE HCL 5 MG PO TABS: 10.0000 mg | ORAL_TABLET | Freq: Four times a day (QID) | ORAL | Status: DC | PRN

## 2014-11-03 MED ORDER — LIDOCAINE HCL (PF) 1 % IJ SOLN
INTRAMUSCULAR | Status: AC
  Filled 2014-11-03: qty 5

## 2014-11-03 MED ORDER — STERILE WATER FOR IRRIGATION IR SOLN
Status: DC | PRN
  Administered 2014-11-03: 500 mL

## 2014-11-03 MED ORDER — ONDANSETRON HCL 4 MG/2ML IJ SOLN
INTRAMUSCULAR | Status: DC | PRN
  Administered 2014-11-03: 4 mg via INTRAVENOUS

## 2014-11-03 SURGICAL SUPPLY — 18 items
BLOCK BITE 60FR ADLT L/F BLUE (MISCELLANEOUS) ×4 IMPLANT
ELECT REM PT RETURN 9FT ADLT (ELECTROSURGICAL)
ELECTRODE REM PT RTRN 9FT ADLT (ELECTROSURGICAL) IMPLANT
FLOOR PAD 36X40 (MISCELLANEOUS) ×4
FORCEP COLD BIOPSY (CUTTING FORCEPS) IMPLANT
FORCEP RJ3 GP 1.8X160 W-NEEDLE (CUTTING FORCEPS) IMPLANT
FORCEPS BIOP RAD 4 LRG CAP 4 (CUTTING FORCEPS) ×4 IMPLANT
FORMALIN 10 PREFIL 20ML (MISCELLANEOUS) ×4 IMPLANT
MANIFOLD NEPTUNE II (INSTRUMENTS) ×4 IMPLANT
NEEDLE SCLEROTHERAPY 25GX240 (NEEDLE) IMPLANT
PAD FLOOR 36X40 (MISCELLANEOUS) ×2 IMPLANT
PROBE APC STR FIRE (PROBE) IMPLANT
PROBE INJECTION GOLD (MISCELLANEOUS)
PROBE INJECTION GOLD 7FR (MISCELLANEOUS) IMPLANT
SNARE ROTATE MED OVAL 20MM (MISCELLANEOUS) IMPLANT
SYR 50ML LL SCALE MARK (SYRINGE) ×4 IMPLANT
TUBING IRRIGATION ENDOGATOR (MISCELLANEOUS) ×4 IMPLANT
WATER STERILE IRR 1000ML POUR (IV SOLUTION) ×4 IMPLANT

## 2014-11-03 NOTE — Anesthesia Preprocedure Evaluation (Addendum)
Anesthesia Evaluation  Patient identified by MRN, date of birth, ID band Patient awake    Reviewed: Allergy & Precautions, NPO status   Airway Mallampati: II  TM Distance: >3 FB Neck ROM: Full  Mouth opening: Limited Mouth Opening Comment: Small opening Dental  (+) Teeth Intact, Dental Advisory Given   Pulmonary          Cardiovascular hypertension, Rhythm:Regular Rate:Normal     Neuro/Psych  Headaches, PSYCHIATRIC DISORDERS Anxiety Depression  Neuromuscular disease    GI/Hepatic hiatal hernia, Denies reflux or problems post gastric bypass   Endo/Other  DM resolved post Gastric bypass  Renal/GU Renal diseaseAdrenocortical insufficiency     Musculoskeletal Patient states she is being worked up for LUPUS   Abdominal   Peds  Hematology   Anesthesia Other Findings   Reproductive/Obstetrics                            Anesthesia Physical Anesthesia Plan  ASA: II and emergent  Anesthesia Plan: MAC   Post-op Pain Management:    Induction:   Airway Management Planned: Simple Face Mask  Additional Equipment:   Intra-op Plan:   Post-operative Plan:   Informed Consent:   Dental advisory given  Plan Discussed with: Surgeon  Anesthesia Plan Comments:         Anesthesia Quick Evaluation

## 2014-11-03 NOTE — Anesthesia Procedure Notes (Signed)
Procedure Name: MAC Date/Time: 11/03/2014 8:33 AM Performed by: Franco Nones Pre-anesthesia Checklist: Patient identified, Emergency Drugs available, Suction available, Timeout performed and Patient being monitored Patient Re-evaluated:Patient Re-evaluated prior to inductionOxygen Delivery Method: Non-rebreather mask

## 2014-11-03 NOTE — Progress Notes (Signed)
PROGRESS NOTE  Bethany Barton:[id number] DOB: Jan 03, 1971 DOA: 10/30/2014 PCP: Cassell Smiles., MD  Summary: 37 yof PMH DM type 2, recent admit for AKI, hypotension, and hypovolemia 8/26-8/27, presented with complaints of nausea, vomiting, and constipation. Evaluation in the emergency room with a CT abdominal scan suggested small bowel obstruction. Admitted for further investigation and management.  Assessment/Plan: 1. Ileus with abdominal pain, possible gastritis, improved. Lipase normal. EGD 11/03/14 results unrevealing, below. GI recommends that patient eats 6 small meals a day.  2. Nausea, vomiting, and constipation secondary to Ileus. Resolved, was able to tolerate liquids and some solids. Bowels are moving.  3. 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 Discussed with Dr. Fransico Him, who recommends Prednisone 10 mg daily until follow up appointment. Hypotension/fluctuating blood pressure appears resolved, antihypertensives adjusted. Possible autoimmune disease? Speckled pattern of ANA with high titers per H&P.  Has follow up with rheumatology  4. DM Type 2, diet controlled with associated neuropathy. Remained stable. SSI 5. History of polysubstance abuse. Per chart  6. Anxiety, depression    Will plan home on Prednisone 10 mg home today   Eat small meals 6 a day  Brendia Sacks, MD  [facility]  Pager [phone] If 7PM-7AM, please contact night-coverage at [email or web address], password [facility] 11/03/2014, 6:42 AM  LOS: 4 days   Consultants:  General surgery- Dr Lovell Sheehan  GI - Dr. 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 No evidence of erosive esophagitis. Very small gastric pouch with gastritis and single erosion. Biopsy taken Patent gastrojejunostomy anastomosis and normal jejunal mucosa for up to 60 cm from the anastomosis  Antibiotics:    HPI/Subjective: Vomiting resolved, was able to tolerate liquids and  some solids. Bowels have moved. Was able to sleep, ready to go home.  Objective: Filed Vitals:   11/02/14 0543 11/02/14 0844 11/02/14 1426 11/02/14 2216  BP: 110/62 125/77 128/72 128/69  Pulse: 54 58 66 71  Temp: 98.7 F (37.1 C)  98.6 F (37 C) 97.8 F (36.6 C)  TempSrc: Oral  Oral Oral  Resp: Height:      Weight:      SpO2: 100% 99% 98% 99%    Intake/Output Summary (Last 24 hours) at 11/03/14 0642 Last data filed at 11/02/14 1819  Gross per 24 hour  Intake    960 ml  Output      0 ml  Net    960 ml     Filed Weights   10/30/14 1158 10/30/14 1756  Weight: 77.111 kg (170 lb) 75.388 kg (166 lb 3.2 oz)    Exam:  Afebrile, VSS, not hypoxic General:  Appears comfortable, calm. Cardiovascular: Regular rate and rhythm, no murmur, rub or gallop. No lower extremity edema. Respiratory: Clear to auscultation bilaterally, no wheezes, rales or rhonchi. Normal respiratory effort. Abdomen: soft, nt. nd Skin: no rash or induration  Psychiatric: grossly normal mood and affect, speech fluent and appropriate  New data reviewed:  No new data  Pertinent data since admission:  CT A/P revealed small midly dilated small bowel loops in the left abdomen which may be normal postoperative change although although can be seen due to ileus or an early/ partial obstructive process. Mild mucosal enhancement and minimal wall thickening of the native stomach which may be due to gastritis.  ACTH stim test. No normal range established.  Pending data:    Scheduled Meds: . cefUROXime  500 mg Oral BID  WC  . clonazePAM  0.5 mg Oral BID  . cloNIDine  0.2 mg Oral BID  . enoxaparin (LOVENOX) injection  40 mg Subcutaneous Q24H  . l-methylfolate-B6-B12  1 tablet Oral Daily  . mupirocin ointment   Nasal BID  . pantoprazole (PROTONIX) IV  40 mg Intravenous Q24H  . polyethylene glycol  17 g Oral BID  . predniSONE  10 mg Oral Q breakfast  . senna  1 tablet Oral QHS   Continuous  Infusions:    Principal Problem:   Ileus Active Problems:   Opioid dependence   DM type 2, uncontrolled, with neuropathy   Essential hypertension, benign   Anxiety   Nausea with vomiting   Gastritis and gastroduodenitis  I, [name], acting as scribe, recorded this note contemporaneously in the presence of Dr. Melton Alar. Irene Limbo, M.D. on 11/03/2014 .  I have reviewed the above documentation for accuracy and completeness, and I agree with the above. Brendia Sacks, MD

## 2014-11-03 NOTE — Transfer of Care (Signed)
Immediate Anesthesia Transfer of Care Note  Patient: Bethany Barton  Procedure(s) Performed: Procedure(s) (LRB): ESOPHAGOGASTRODUODENOSCOPY (EGD) WITH PROPOFOL (N/A)  Patient Location: PACU  Anesthesia Type: MAC  Level of Consciousness: awake  Airway & Oxygen Therapy: Patient Spontanous Breathing. Nasal cannula  Post-op Assessment: Report given to PACU RN, Post -op Vital signs reviewed and stable and Patient moving all extremities  Post vital signs: Reviewed and stable  Complications: No apparent anesthesia complications

## 2014-11-03 NOTE — Anesthesia Postprocedure Evaluation (Signed)
  Anesthesia Post-op Note  Patient: Bethany Barton  Procedure(s) Performed: Procedure(s) with comments: ESOPHAGOGASTRODUODENOSCOPY (EGD) WITH PROPOFOL (N/A) - anastomosis @ 35  Patient Location: PACU  Anesthesia Type:MAC  Level of Consciousness: awake, alert  and patient cooperative  Airway and Oxygen Therapy: Patient Spontanous Breathing and non-rebreather face mask  Post-op Pain: mild  Post-op Assessment: Post-op Vital signs reviewed, Patient's Cardiovascular Status Stable, Respiratory Function Stable and Patent Airway              Post-op Vital Signs: Reviewed and stable  Last Vitals:  Filed Vitals:   11/03/14 0904  BP: 126/77  Pulse:   Temp: 36.6 C  Resp: 16    Complications: No apparent anesthesia complications

## 2014-11-03 NOTE — Op Note (Signed)
EGD PROCEDURE REPORT  PATIENT:  Bethany Barton  MR#:  [id number] Birthdate:  1970-04-01, 44 y.o., female Endoscopist:  Dr. Malissa Hippo, MD Referred By:  Dr. Brendia Sacks, MD  Procedure Date: 11/03/2014  Procedure:   EGD  Indications:  Patient is 44 year old Caucasian female with complicated history whose been experiencing nausea vomiting intermittent diarrhea as well as epigastric pain since she received multiple courses of anti-biotic for right foot cellulitis. Diarrhea has improved but her epigastric pain persists along with nausea and anorexia. GI history is significant for Roux-en-Y gastric surgery for obesity.            Informed Consent:  The risks, benefits, alternatives & imponderables which include, but are not limited to, bleeding, infection, perforation, drug reaction and potential missed lesion have been reviewed.  The potential for biopsy, lesion removal, esophageal dilation, etc. have also been discussed.  Questions have been answered.  All parties agreeable.  Please see history & physical in medical record for more information.  Medications:  Cetacaine spray topically for oropharyngeal anesthesia Monitored anesthesia care. Please see Tobi Bastos sees her records for details.  Description of procedure:  The endoscope was introduced through the mouth and advanced to the second portion of the duodenum without difficulty or limitations. The mucosal surfaces were surveyed very carefully during advancement of the scope and upon withdrawal.  Findings:  Esophagus:  Mucosa of the esophagus was normal. GEJ:  32 cm Hiatus:  35 cm Stomach:  Very small gastric pouch with patchy erythema edema and single erosion. Gastrojejunal anastomosis was wide open marks by metallic clips. Jejunum:  Jejunal mucosa was examined for 60 cm and revealed no abnormality.  Therapeutic/Diagnostic Maneuvers Performed:  Gastric biopsy taken for routine histology.  Complications:  None  Impression: Altered UGI  anatomy secondary to Roux-en-Y gastric bypass. No evidence of erosive esophagitis. Very small gastric pouch with gastritis and single erosion. Biopsy taken Patent gastrojejunostomy anastomosis and normal jejunal mucosa for up to 60 cm from the anastomosis  Recommendations:  Patient should be in 6 small meals a day. Await biopsy results.  REHMAN,NAJEEB U  11/03/2014  9:00 AM  CC: Dr. Cassell Smiles., MD & Dr. Bonnetta Barry ref. provider found

## 2014-11-05 ENCOUNTER — Other Ambulatory Visit (HOSPITAL_COMMUNITY): Payer: Self-pay | Admitting: Internal Medicine

## 2014-11-05 DIAGNOSIS — Z1231 Encounter for screening mammogram for malignant neoplasm of breast: Secondary | ICD-10-CM

## 2014-11-06 ENCOUNTER — Encounter (HOSPITAL_COMMUNITY): Payer: Self-pay | Admitting: Internal Medicine

## 2014-11-06 LAB — GI PATHOGEN PANEL BY PCR, STOOL
C difficile toxin A/B: NOT DETECTED
CRYPTOSPORIDIUM BY PCR: NOT DETECTED
Campylobacter by PCR: NOT DETECTED
E coli (ETEC) LT/ST: NOT DETECTED
E coli (STEC): NOT DETECTED
E coli 0157 by PCR: NOT DETECTED
G LAMBLIA BY PCR: NOT DETECTED
NOROVIRUS G1/G2: NOT DETECTED
ROTAVIRUS A BY PCR: NOT DETECTED
Salmonella by PCR: NOT DETECTED
Shigella by PCR: NOT DETECTED

## 2014-11-14 DIAGNOSIS — E2740 Unspecified adrenocortical insufficiency: Secondary | ICD-10-CM | POA: Insufficient documentation

## 2014-11-19 ENCOUNTER — Encounter: Payer: Self-pay | Admitting: Neurology

## 2014-11-19 ENCOUNTER — Ambulatory Visit (INDEPENDENT_AMBULATORY_CARE_PROVIDER_SITE_OTHER): Payer: 59 | Admitting: Neurology

## 2014-11-19 VITALS — BP 159/95 | HR 84 | Ht 63.0 in | Wt 172.0 lb

## 2014-11-19 DIAGNOSIS — G43009 Migraine without aura, not intractable, without status migrainosus: Secondary | ICD-10-CM | POA: Diagnosis not present

## 2014-11-19 DIAGNOSIS — G629 Polyneuropathy, unspecified: Secondary | ICD-10-CM

## 2014-11-19 MED ORDER — SUMATRIPTAN SUCCINATE 6 MG/0.5ML ~~LOC~~ SOLN: 6.0000 mg | SUBCUTANEOUS | Status: DC | PRN

## 2014-11-19 MED ORDER — TOPIRAMATE 25 MG PO TABS: ORAL_TABLET | ORAL | Status: DC

## 2014-11-19 MED ORDER — TOPIRAMATE 100 MG PO TABS: 100.0000 mg | ORAL_TABLET | Freq: Two times a day (BID) | ORAL | Status: DC
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
## 2014-11-22 ENCOUNTER — Emergency Department (HOSPITAL_COMMUNITY): Payer: 59

## 2014-11-22 ENCOUNTER — Emergency Department (HOSPITAL_COMMUNITY)
Admission: EM | Admit: 2014-11-22 | Discharge: 2014-11-22 | Disposition: A | Discharge status: Home / Self Care | Payer: 59 | Attending: Emergency Medicine | Admitting: Emergency Medicine

## 2014-11-22 ENCOUNTER — Encounter (HOSPITAL_COMMUNITY): Payer: Self-pay

## 2014-11-22 DIAGNOSIS — R569 Unspecified convulsions: Secondary | ICD-10-CM | POA: Diagnosis present

## 2014-11-22 DIAGNOSIS — Y9389 Activity, other specified: Secondary | ICD-10-CM | POA: Insufficient documentation

## 2014-11-22 DIAGNOSIS — Y998 Other external cause status: Secondary | ICD-10-CM | POA: Insufficient documentation

## 2014-11-22 DIAGNOSIS — Y9289 Other specified places as the place of occurrence of the external cause: Secondary | ICD-10-CM | POA: Insufficient documentation

## 2014-11-22 DIAGNOSIS — F1110 Opioid abuse, uncomplicated: Secondary | ICD-10-CM | POA: Insufficient documentation

## 2014-11-22 DIAGNOSIS — R000 Tachycardia, unspecified: Secondary | ICD-10-CM | POA: Diagnosis not present

## 2014-11-22 DIAGNOSIS — F329 Major depressive disorder, single episode, unspecified: Secondary | ICD-10-CM | POA: Insufficient documentation

## 2014-11-22 DIAGNOSIS — S00512A Abrasion of oral cavity, initial encounter: Secondary | ICD-10-CM | POA: Diagnosis not present

## 2014-11-22 DIAGNOSIS — F419 Anxiety disorder, unspecified: Secondary | ICD-10-CM | POA: Diagnosis not present

## 2014-11-22 DIAGNOSIS — G43909 Migraine, unspecified, not intractable, without status migrainosus: Secondary | ICD-10-CM | POA: Diagnosis not present

## 2014-11-22 DIAGNOSIS — X58XXXA Exposure to other specified factors, initial encounter: Secondary | ICD-10-CM | POA: Insufficient documentation

## 2014-11-22 DIAGNOSIS — Z872 Personal history of diseases of the skin and subcutaneous tissue: Secondary | ICD-10-CM | POA: Diagnosis not present

## 2014-11-22 DIAGNOSIS — I10 Essential (primary) hypertension: Secondary | ICD-10-CM | POA: Diagnosis not present

## 2014-11-22 DIAGNOSIS — E119 Type 2 diabetes mellitus without complications: Secondary | ICD-10-CM | POA: Insufficient documentation

## 2014-11-22 DIAGNOSIS — Z8739 Personal history of other diseases of the musculoskeletal system and connective tissue: Secondary | ICD-10-CM | POA: Insufficient documentation

## 2014-11-22 DIAGNOSIS — Z3202 Encounter for pregnancy test, result negative: Secondary | ICD-10-CM | POA: Diagnosis not present

## 2014-11-22 DIAGNOSIS — Z79899 Other long term (current) drug therapy: Secondary | ICD-10-CM | POA: Diagnosis not present

## 2014-11-22 LAB — URINALYSIS, ROUTINE W REFLEX MICROSCOPIC
BILIRUBIN URINE: NEGATIVE
Glucose, UA: NEGATIVE mg/dL
Ketones, ur: NEGATIVE mg/dL
Leukocytes, UA: NEGATIVE
NITRITE: NEGATIVE
PROTEIN: 30 mg/dL — AB
SPECIFIC GRAVITY, URINE: 1.023 (ref 1.005–1.030)
UROBILINOGEN UA: 0.2 mg/dL (ref 0.0–1.0)
pH: 5.5 (ref 5.0–8.0)

## 2014-11-22 LAB — I-STAT TROPONIN, ED
Troponin i, poc: 0 ng/mL (ref 0.00–0.08)
Troponin i, poc: 0.01 ng/mL (ref 0.00–0.08)

## 2014-11-22 LAB — CBC WITH DIFFERENTIAL/PLATELET
Basophils Absolute: 0 10*3/uL (ref 0.0–0.1)
Basophils Relative: 0 %
EOS ABS: 0.1 10*3/uL (ref 0.0–0.7)
EOS PCT: 1 %
HCT: 39.7 % (ref 36.0–46.0)
Hemoglobin: 13.3 g/dL (ref 12.0–15.0)
LYMPHS ABS: 2.3 10*3/uL (ref 0.7–4.0)
LYMPHS PCT: 18 %
MCH: 30.4 pg (ref 26.0–34.0)
MCHC: 33.5 g/dL (ref 30.0–36.0)
MCV: 90.6 fL (ref 78.0–100.0)
MONO ABS: 1.1 10*3/uL — AB (ref 0.1–1.0)
MONOS PCT: 9 %
Neutro Abs: 9.2 10*3/uL — ABNORMAL HIGH (ref 1.7–7.7)
Neutrophils Relative %: 72 %
PLATELETS: 290 10*3/uL (ref 150–400)
RBC: 4.38 MIL/uL (ref 3.87–5.11)
RDW: 13.5 % (ref 11.5–15.5)
WBC: 13 10*3/uL — ABNORMAL HIGH (ref 4.0–10.5)

## 2014-11-22 LAB — BASIC METABOLIC PANEL
Anion gap: 11 (ref 5–15)
BUN: 16 mg/dL (ref 4–21)
BUN: 16 mg/dL (ref 6–20)
CALCIUM: 8.4 mg/dL — AB (ref 8.9–10.3)
CO2: 23 mmol/L (ref 22–32)
CREATININE: 0.61 mg/dL (ref 0.44–1.00)
Chloride: 101 mmol/L (ref 101–111)
Creatinine: 0.6 mg/dL (ref 0.5–1.1)
GFR calc Af Amer: >60 mL/min (ref >60)
GLUCOSE: 73 mg/dL
GLUCOSE: 73 mg/dL (ref 65–99)
POTASSIUM: 3.6 mmol/L (ref 3.5–5.1)
SODIUM: 135 mmol/L (ref 135–145)

## 2014-11-22 LAB — URINE MICROSCOPIC-ADD ON

## 2014-11-22 LAB — RAPID URINE DRUG SCREEN, HOSP PERFORMED
Amphetamines: NOT DETECTED
Barbiturates: NOT DETECTED
Benzodiazepines: NOT DETECTED
COCAINE: NOT DETECTED
OPIATES: POSITIVE — AB
TETRAHYDROCANNABINOL: NOT DETECTED

## 2014-11-22 LAB — LIPID PANEL
CHOLESTEROL: 167 mg/dL (ref 0–200)
HDL: 47 mg/dL (ref 35–70)
LDL CALC: 93 mg/dL
Triglycerides: 133 mg/dL (ref 40–160)

## 2014-11-22 LAB — D-DIMER, QUANTITATIVE (NOT AT ARMC)

## 2014-11-22 LAB — TSH: TSH: 3.34 u[IU]/mL (ref 0.41–5.90)

## 2014-11-22 LAB — POC URINE PREG, ED: Preg Test, Ur: NEGATIVE

## 2014-11-22 LAB — POCT ERYTHROCYTE SEDIMENTATION RATE, NON-AUTOMATED: SED RATE: 8 mm

## 2014-11-22 MED ORDER — ACETAMINOPHEN 325 MG PO TABS
650.0000 mg | ORAL_TABLET | Freq: Once | ORAL | Status: AC
Start: 2014-11-22 — End: 2014-11-22
  Administered 2014-11-22: 650 mg via ORAL
  Filled 2014-11-22: qty 2

## 2014-11-22 MED ORDER — ONDANSETRON HCL 4 MG/2ML IJ SOLN
4.0000 mg | Freq: Once | INTRAMUSCULAR | Status: AC
  Administered 2014-11-22: 4 mg via INTRAVENOUS
  Filled 2014-11-22: qty 2

## 2014-11-22 NOTE — ED Notes (Signed)
PER EMS: pt was leaving work, she became very dizzy and bystander reports patient did not look great and assisted her to the ground and then the pt began to have a seizure. Bystanders say it lasted 10 minutes, full tonic clonic seizure. Upon arrival pt was A&OX4 but slow to respond. Initial HR was 140, BP 160 palpated, 12 lead unremarkable. Pt is undergoing testing by PCP for lupus. Oral trauma to right side of tongue, no urinary incontinence. No hx of seizures.

## 2014-11-22 NOTE — Discharge Instructions (Signed)

## 2014-11-22 NOTE — ED Notes (Signed)
Pt A&OX4, ambulatory at d/c with steady gait but wheeled out via wheelchair. Pt states she has all of her belongings with her at discharge. Pt is asking if she had her glasses when she arrived to the ED but she did not. Pt states they might be in her pocketbook.

## 2014-11-22 NOTE — ED Provider Notes (Signed)
CSN: [id number]     Arrival date & time 11/22/14  1745 History   First MD Initiated Contact with Patient 11/22/14 1753     Chief Complaint  Patient presents with  . Seizures   Bethany Barton is a 44 y.o. female  With a past medical history significant for hypertension, diabetes, substance abuse, and recent admission for ileus and adrenal insufficiency who presents with loss of consciousness concerning for seizure and chest pain, shortness of breath, and anxiety. The patient reports that she was feeling well over the last several days and began feeling fatigued while leaving work today. The patient said she was walking outside and a bystander saw her again to collapse. The bystander reported that the patient had a tonic-clonic seizure for around 10 minutes. The EMS team reported that the patient was tachycardic into the 140s on their arrival and she was postictal. The patient said that she was not having any chest pain until arriving at [facility]. The patient began having chest pain and shortness of breath. The patient described the pain as sharp and located in her central chest. The patient denied any radiation, any diaphoresis but did report some nausea. The patient denies recent fevers, chills, constipation, diarrhea, dysuria. Of note, the patient was recently admitted for an ileus and was not managed surgically. The patient also reports that she is being worked up for lupus and adrenal insufficiency currently. The patient denies any other symptoms on arrival and has never had a history of seizures.    (Consider location/radiation/quality/duration/timing/severity/associated sxs/prior Treatment) Patient is a 44 y.o. female presenting with seizures and chest pain. The history is provided by the patient and the spouse. No language interpreter was used.  Seizures Seizure activity on arrival: no   Seizure type:  Grand mal Preceding symptoms comment:  Fatigue Initial focality:   None Episode characteristics: generalized shaking and unresponsiveness   Postictal symptoms: confusion and memory loss   Return to baseline: yes   Severity:  Moderate Duration:  10 minutes (per bystander report, EMS unsure of length) Timing:  Once Progression:  Resolved Context: not previous head injury   Recent head injury:  No recent head injuries PTA treatment:  None History of seizures: no   Chest Pain Pain location:  Substernal area Pain quality: sharp   Pain radiates to:  Does not radiate Pain radiates to the back: no   Pain severity:  Moderate Onset quality:  Sudden Duration:  30 minutes Timing:  Intermittent Progression:  Waxing and waning Chronicity:  New Relieved by:  Nothing Worsened by:  Nothing tried Ineffective treatments:  None tried Associated symptoms: anxiety, nausea and shortness of breath   Associated symptoms: no abdominal pain, no back pain, no cough, no diaphoresis, no fatigue, no fever, no headache, no numbness, no palpitations, not vomiting and no weakness   Nausea:    Severity:  Moderate Shortness of breath:    Severity:  Mild Risk factors: diabetes mellitus and immobilization (recent admission for ileus)     Past Medical History  Diagnosis Date  . Hypertension   . Peripheral neuropathy   . Facial tic   . Diabetes mellitus without complication   . Polysubstance abuse     etoh, opiates  . Migraines   . Depression   . Anxiety   . Osteoarthritis   . Neuropathic pain of both feet     chronic  . Cellulitis     Right foot   Past Surgical History  Procedure Laterality Date  . 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 Abdominal hysterectomy  2013  . Ankle fusion Right 1991  . Appendectomy  1987  . Cesarean section  1997  . Abdominoplasty  2013  . Cholecystectomy    . Incision and drainage abscess N/A 09/25/2013    Procedure: INCISION AND DRAINAGE ABSCESS;  Surgeon: Cherylynn Ridges, MD;  Location: [facility] OR;  Service: General;   Laterality: N/A;  . Cholecystectomy    . Esophagogastroduodenoscopy (egd) with propofol N/A 11/03/2014    Procedure: ESOPHAGOGASTRODUODENOSCOPY (EGD) WITH PROPOFOL;  Surgeon: Malissa Hippo, MD;  Location: [facility];  Service: Endoscopy;  Laterality: N/A;  anastomosis @ 35  . Esophageal biopsy N/A 11/03/2014    Procedure: BIOPSY;  Surgeon: Malissa Hippo, MD;  Location: [facility];  Service: Endoscopy;  Laterality: N/A;   Family History  Problem Relation Age of Onset  . Bipolar disorder Sister   . Drug abuse Sister   . Schizophrenia Son   . Bipolar disorder Father   . Alcohol abuse Father   . Drug abuse Mother   . Alcohol abuse Mother    Social History  Substance Use Topics  . Smoking status: Never Smoker   . Smokeless tobacco: Never Used  . Alcohol Use: No     Comment: Denies alcohol since last June. /history of cocaine and narcotic dependence 07/25/13   OB History    No data available     Review of Systems  Constitutional: Negative for fever, chills, diaphoresis and fatigue.  HENT: Negative for congestion and rhinorrhea.   Eyes: Negative for visual disturbance.  Respiratory: Positive for shortness of breath. Negative for cough, chest tightness, wheezing and stridor.   Cardiovascular: Positive for chest pain. Negative for palpitations and leg swelling.  Gastrointestinal: Positive for nausea. Negative for vomiting, abdominal pain, diarrhea and constipation.  Genitourinary: Negative for dysuria and flank pain.  Musculoskeletal: Negative for back pain, neck pain and neck stiffness.  Skin: Negative for rash and wound.  Neurological: Positive for seizures. Negative for weakness, numbness and headaches.  Psychiatric/Behavioral: Negative for confusion.  All other systems reviewed and are negative.     Allergies  Iodine; Lyrica; Neomycin; Nsaids; Phenergan; and Sulfa antibiotics  Home Medications   Prior to Admission medications   Medication Sig Start Date End Date Taking?  Authorizing Provider  acetaminophen (TYLENOL) 500 MG tablet Take 500 mg by mouth every 6 (six) hours as needed.    Historical Provider, MD  clonazePAM (KLONOPIN) 0.5 MG tablet Take 0.5 mg by mouth 2 (two) times daily. 09/13/14   Historical Provider, MD  cloNIDine (CATAPRES) 0.2 MG tablet Take 1 tablet (0.2 mg total) by mouth 2 (two) times daily. 11/03/14   Standley Brooking, MD  cyclobenzaprine (FLEXERIL) 10 MG tablet Take 1-2 at bedtime prn. 11/13/14   Historical Provider, MD  DULoxetine (CYMBALTA) 60 MG capsule Take 60 mg by mouth daily.    Historical Provider, MD  eletriptan (RELPAX) 40 MG tablet Take 1 tablet (40 mg total) by mouth as needed for migraine or headache. One tablet by mouth at onset of headache. May repeat in 2 hours if headache persists or recurs. 11/12/13   Levert Feinstein, MD  L-Methylfolate-B6-B12 (FOLTANX) 3-35-2 MG TABS Take 1 tablet by mouth 2 (two) times daily. 10/16/14   Historical Provider, MD  Multiple Vitamin (MULTIVITAMIN WITH MINERALS) TABS tablet Take 1 tablet by mouth daily.    Historical Provider, MD  NUVIGIL 200 MG TABS Take one tablet daily. 10/21/14   Historical Provider, MD  ondansetron (ZOFRAN) 8 MG tablet Take 1 tablet (8 mg total) by mouth every 8 (eight) hours as needed for nausea or vomiting. 10/26/14   Henderson Cloud, MD  Oxycodone HCl 10 MG TABS Take one tablet every three hours as needed for pain. 10/16/14   Historical Provider, MD  predniSONE (DELTASONE) 10 MG tablet Take 1 tablet (10 mg total) by mouth daily with breakfast. 11/03/14   Standley Brooking, MD  SUMAtriptan (IMITREX) 6 MG/0.5ML SOLN injection Inject 0.5 mLs (6 mg total) into the skin every 2 (two) hours as needed for migraine or headache. May repeat in 2 hours if headache persists or recurs. 11/19/14   Levert Feinstein, MD  topiramate (TOPAMAX) 100 MG tablet Take 1 tablet (100 mg total) by mouth 2 (two) times daily. 11/19/14   Levert Feinstein, MD  topiramate (TOPAMAX) 25 MG tablet One po bid xone week, then 2  tabs po bid, then 3 tabs po bid 11/19/14   Levert Feinstein, MD  VITAMIN D, CHOLECALCIFEROL, PO Take 2,000 Units by mouth daily.    Historical Provider, MD   SpO2 99% Physical Exam  Constitutional: She is oriented to person, place, and time. She appears well-developed and well-nourished. She does not appear ill. No distress.  HENT:  Nose: Nose normal.  Mouth/Throat:    Eyes: Conjunctivae and EOM are normal. Pupils are equal, round, and reactive to light.  Neck: Normal range of motion.  Cardiovascular: Regular rhythm, normal heart sounds and intact distal pulses.  Tachycardia present.   No murmur heard. Pulmonary/Chest: Effort normal and breath sounds normal. No accessory muscle usage or stridor. No respiratory distress. She has no wheezes. She has no rhonchi. She has no rales. She exhibits no tenderness.  Abdominal: Soft. She exhibits no mass. There is no tenderness. There is no rebound.  Musculoskeletal: Normal range of motion. She exhibits no tenderness.  Neurological: She is alert and oriented to person, place, and time. She has normal reflexes. She displays normal reflexes. No cranial nerve deficit. She exhibits normal muscle tone. Coordination normal.  Skin: Skin is warm. She is not diaphoretic. No erythema.  Psychiatric: She has a normal mood and affect.  Nursing note and vitals reviewed.   ED Course  Procedures (including critical care time) Labs Review Labs Reviewed  URINALYSIS, ROUTINE W REFLEX MICROSCOPIC (NOT AT [facility]) - Abnormal; Notable for the following:    APPearance CLOUDY (*)    Hgb urine dipstick SMALL (*)    Protein, ur 30 (*)    All other components within normal limits  URINE RAPID DRUG SCREEN, HOSP PERFORMED - Abnormal; Notable for the following:    Opiates POSITIVE (*)    All other components within normal limits  URINE MICROSCOPIC-ADD ON - Abnormal; Notable for the following:    Casts HYALINE CASTS (*)    Crystals CA OXALATE CRYSTALS (*)    All other components  within normal limits  CBC WITH DIFFERENTIAL/PLATELET - Abnormal; Notable for the following:    WBC 13.0 (*)    Neutro Abs 9.2 (*)    Monocytes Absolute 1.1 (*)    All other components within normal limits  BASIC METABOLIC PANEL - Abnormal; Notable for the following:    Calcium 8.4 (*)    All other components within normal limits  URINE CULTURE  D-DIMER, QUANTITATIVE (NOT AT [facility] [place])  POC URINE PREG, ED  Rosezena Sensor, ED  Rosezena Sensor, ED  Rosezena Sensor, ED    Imaging Review Ct Head Wo Contrast  11/22/2014   CLINICAL DATA:  First time seizure. Dizziness and fall prior to the seizure.  EXAM: CT HEAD WITHOUT CONTRAST  TECHNIQUE: Contiguous axial images were obtained from the base of the skull through the vertex without intravenous contrast.  COMPARISON:  01/03/2011.  FINDINGS: Normal appearing cerebral hemispheres and posterior fossa structures. Normal size and position of the ventricles. No skull fracture, intracranial hemorrhage, mass lesion, CT evidence of acute infarction or paranasal sinus air-fluid levels. Minimal left maxillary and right sphenoid sinus mucosal thickening.  IMPRESSION: 1. No skull fracture or intracranial hemorrhage. 2. Minimal chronic sinusitis.   Electronically Signed   By: Beckie Salts M.D.   On: 11/22/2014 19:34   Dg Chest Portable 1 View  11/22/2014   CLINICAL DATA:  Patient recently discharged from hospital for abdominal pain. Tired and fatigued.  EXAM: PORTABLE CHEST 1 VIEW  COMPARISON:  Chest radiograph 10/25/2014  FINDINGS: Monitoring leads overlie the patient. Stable cardiac and mediastinal contours. No consolidative pulmonary opacities. No pleural effusion or pneumothorax.  IMPRESSION: No active disease.   Electronically Signed   By: Annia Belt M.D.   On: 11/22/2014 18:41   I have personally reviewed and evaluated these images and lab results as part of my medical decision-making.   EKG Interpretation   Date/Time:  Friday November 22 2014 17:55:09  EDT Ventricular Rate:  111 PR Interval:  164 QRS Duration: 92 QT Interval:  347 QTC Calculation: 471 R Axis:   53 Text Interpretation:  Sinus tachycardia Probable inferior infarct, old  Probable anterolateral infarct, old [id number] No old tracing to compare  Confirmed by Rhunette Croft, MD, Janey Genta [id number]) on 11/22/2014 8:16:40 PM      MDM   Vertell Limber is a 44 y.o. female  With a past medical history significant for hypertension, diabetes, substance abuse, and recent admission for ileus and adrenal insufficiency who presents with loss of consciousness concerning for seizure and chest pain, shortness of breath, and anxiety. Based on the patient's history there is concern for seizure versus syncope. The patient had diagnostic laboratory and imaging studies to investigate the etiology of her symptoms.   The patient's diagnostic workup included a CT scan of the head which did not show any intracranial hemorrhage, mass, or other abnormality. There was no fracture from her loss of consciousness. The patient's chest x-ray showed no acute cardiopulmonary disease. The patient's BMP revealed a normal renal function and normal electrolytes. The patient's CBC showed a mild leukocytosis of 13.0 but otherwise was unremarkable. The patient's d-dimer was negative and her troponins were negative 2.   The patient's significant other arrived and report that her PCP has been changing some of her home medications including her blood pressure medications. These changes may have contributed to the patient's episode today. As the patient's workup has been unremarkable for intracranial abnormalities, pulmonary emboli, ACS, and infectious etiologies, the patient was felt appropriate for observation and subsequent discharge. The patient was provided Tylenol for her mild headache which she reports resolved. The patient was watched in the emergency department during her workup and she did not have any further episodes of seizure or  syncope. The patient reports feeling much better after resting in the ED.   The patient was instructed to follow-up with her PCP in the next several days to continue further workup and relation to her first-time seizure. The patient was also given return precautions  for any new or worsening symptoms. The patient had no other questions, concerns, or complaints and the patient was discharged in good condition.   This patient was seen with Dr. Rhunette Croft,  emergency medicine attending.   Final diagnoses:  Seizure          Theda Belfast, MD 11/23/14 4401  Derwood Kaplan, MD 12/02/14 0272

## 2014-11-22 NOTE — ED Notes (Signed)
Pt now reporting chest pain, around left breast, sharp burning pain that just started a few minutes ago all of a sudden. Dr. Rush Landmark at bedside.

## 2014-11-25 ENCOUNTER — Encounter: Payer: Self-pay | Admitting: Neurology

## 2014-11-25 ENCOUNTER — Ambulatory Visit (HOSPITAL_COMMUNITY): Payer: Self-pay

## 2014-11-25 ENCOUNTER — Ambulatory Visit (INDEPENDENT_AMBULATORY_CARE_PROVIDER_SITE_OTHER): Payer: 59 | Admitting: Neurology

## 2014-11-25 ENCOUNTER — Telehealth: Payer: Self-pay | Admitting: Neurology

## 2014-11-25 VITALS — BP 181/127 | HR 121 | Ht 63.0 in | Wt 167.0 lb

## 2014-11-25 DIAGNOSIS — R569 Unspecified convulsions: Secondary | ICD-10-CM | POA: Diagnosis not present

## 2014-11-25 LAB — URINE CULTURE
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
## 2014-11-25 NOTE — Patient Instructions (Signed)
No driving until seizure-free for 6 months 

## 2014-11-25 NOTE — Telephone Encounter (Signed)
Spoke to patient - her appt has been moved to an available spot for today - instructed her not to drive.

## 2014-11-25 NOTE — Telephone Encounter (Signed)
Pt called to make f/u appt post hospital visit for new seizures. Pt states that she was leaving work Friday and felt dizzy and disoriented. She states the next thing she remembers is waking up in the ambulance. She didn't recognize any one around. Says it was about an hr before she could. She was taken to [facility] . Pt states she has been afraid to take her medication because she doesn't know what has set it off. She states that she is exhausted still. Appt was made for Oct. 3rd at 730 am. Pt drove to work today. [phone]

## 2014-11-26 ENCOUNTER — Telehealth (HOSPITAL_BASED_OUTPATIENT_CLINIC_OR_DEPARTMENT_OTHER): Payer: Self-pay | Admitting: Emergency Medicine

## 2014-11-26 ENCOUNTER — Telehealth: Payer: Self-pay | Admitting: Neurology

## 2014-11-26 NOTE — Telephone Encounter (Signed)
Spoke to [employer] - her mother told her she had a seizure at age 44 - similar to her recent episode - collapsed, become cyanotic then started having convulsions.  She was evaluated - no medications were started and no cause was determined.  She never had another incident until recently.

## 2014-11-26 NOTE — Telephone Encounter (Signed)
Reviewed, will go over her seizure history in detail at her next follow up visit. She is already on Topamax  bid

## 2014-11-26 NOTE — Telephone Encounter (Signed)
Patient called to advise Dr. Terrace Arabia that she spoke with her Mother last night and Mom advised that when patient was 44 years old she had a gran mal seizure.

## 2014-11-26 NOTE — Telephone Encounter (Signed)
Post ED Visit - Positive Culture Follow-up  Culture report reviewed by antimicrobial stewardship pharmacist:   Celedonio Miyamoto, Pharm.D., BCPS  Georgina Pillion, Pharm.D., BCPS  [place], [place].D., BCPS, AAHIVP  Estella Husk, Pharm.D., BCPS, AAHIVP  [place], [address].D.  Tennis Must, Pharm.D.  Positive urine culture E. coli Treated with none, asymptomatic, and no further patient follow-up is required at this time.  Berle Mull 11/26/2014, 9:47 AM

## 2014-11-26 NOTE — Telephone Encounter (Signed)
Left message for a return call - would like to get more information concerning her childhood seizure.

## 2014-11-28 ENCOUNTER — Other Ambulatory Visit: Payer: Self-pay | Admitting: Neurology

## 2014-11-28 ENCOUNTER — Telehealth: Payer: Self-pay | Admitting: *Deleted

## 2014-11-28 NOTE — Telephone Encounter (Signed)
Rx for Nuvigil faxed and confirmed to [facility] at [phone].

## 2014-12-02 ENCOUNTER — Ambulatory Visit: Payer: 59 | Admitting: Neurology

## 2014-12-03 ENCOUNTER — Telehealth (INDEPENDENT_AMBULATORY_CARE_PROVIDER_SITE_OTHER): Payer: Self-pay | Admitting: *Deleted

## 2014-12-03 ENCOUNTER — Telehealth: Payer: Self-pay | Admitting: Internal Medicine

## 2014-12-03 NOTE — Telephone Encounter (Signed)
Patient was scheduled an apt with Dorene Ar, NP. Per Dr. Karilyn Cota, this is Dr. Luvenia Starch patient that he did an EGD on while covering there office. Melessa Cowell that Dr. Karilyn Cota said she needed to go to the ED but call Dr. Luvenia Starch office and make them aware. [facility] was called and told also.

## 2014-12-03 NOTE — Telephone Encounter (Signed)
Lupita Leash from [place] office called to let us know that the appointment that was made with them was cancelled because NUR was covering for [facility] and she is technically a [facility] patient and will need to be scheduled with [place] whenever the patient calls. Lupita Leash advised her to go to the ED and to contact us as well. This is just a heads up if the patient calls and I'm not at my desk.

## 2014-12-03 NOTE — Telephone Encounter (Signed)
Pt has appt on 12/12/2014 with Gerrit Halls

## 2014-12-04 ENCOUNTER — Ambulatory Visit: Payer: Self-pay | Admitting: Urology

## 2014-12-05 ENCOUNTER — Encounter (HOSPITAL_COMMUNITY): Payer: Self-pay | Admitting: *Deleted

## 2014-12-05 ENCOUNTER — Emergency Department (HOSPITAL_COMMUNITY): Payer: 59

## 2014-12-05 ENCOUNTER — Ambulatory Visit (INDEPENDENT_AMBULATORY_CARE_PROVIDER_SITE_OTHER): Payer: Self-pay | Admitting: Internal Medicine

## 2014-12-05 ENCOUNTER — Emergency Department (HOSPITAL_COMMUNITY)
Admission: EM | Admit: 2014-12-05 | Discharge: 2014-12-05 | Disposition: A | Discharge status: Home / Self Care | Payer: 59 | Attending: Emergency Medicine | Admitting: Emergency Medicine

## 2014-12-05 DIAGNOSIS — Z3202 Encounter for pregnancy test, result negative: Secondary | ICD-10-CM | POA: Insufficient documentation

## 2014-12-05 DIAGNOSIS — G43909 Migraine, unspecified, not intractable, without status migrainosus: Secondary | ICD-10-CM | POA: Diagnosis not present

## 2014-12-05 DIAGNOSIS — Z872 Personal history of diseases of the skin and subcutaneous tissue: Secondary | ICD-10-CM | POA: Insufficient documentation

## 2014-12-05 DIAGNOSIS — Z90710 Acquired absence of both cervix and uterus: Secondary | ICD-10-CM | POA: Insufficient documentation

## 2014-12-05 DIAGNOSIS — L97509 Non-pressure chronic ulcer of other part of unspecified foot with unspecified severity: Secondary | ICD-10-CM

## 2014-12-05 DIAGNOSIS — R1012 Left upper quadrant pain: Secondary | ICD-10-CM | POA: Insufficient documentation

## 2014-12-05 DIAGNOSIS — Z9049 Acquired absence of other specified parts of digestive tract: Secondary | ICD-10-CM | POA: Diagnosis not present

## 2014-12-05 DIAGNOSIS — R112 Nausea with vomiting, unspecified: Secondary | ICD-10-CM | POA: Insufficient documentation

## 2014-12-05 DIAGNOSIS — F329 Major depressive disorder, single episode, unspecified: Secondary | ICD-10-CM | POA: Diagnosis not present

## 2014-12-05 DIAGNOSIS — L97511 Non-pressure chronic ulcer of other part of right foot limited to breakdown of skin: Secondary | ICD-10-CM | POA: Insufficient documentation

## 2014-12-05 DIAGNOSIS — G629 Polyneuropathy, unspecified: Secondary | ICD-10-CM | POA: Insufficient documentation

## 2014-12-05 DIAGNOSIS — E11621 Type 2 diabetes mellitus with foot ulcer: Secondary | ICD-10-CM | POA: Insufficient documentation

## 2014-12-05 DIAGNOSIS — Z7952 Long term (current) use of systemic steroids: Secondary | ICD-10-CM | POA: Diagnosis not present

## 2014-12-05 DIAGNOSIS — R140 Abdominal distension (gaseous): Secondary | ICD-10-CM | POA: Diagnosis not present

## 2014-12-05 DIAGNOSIS — G40909 Epilepsy, unspecified, not intractable, without status epilepticus: Secondary | ICD-10-CM | POA: Diagnosis not present

## 2014-12-05 DIAGNOSIS — M79674 Pain in right toe(s): Secondary | ICD-10-CM | POA: Diagnosis present

## 2014-12-05 DIAGNOSIS — I10 Essential (primary) hypertension: Secondary | ICD-10-CM | POA: Diagnosis not present

## 2014-12-05 DIAGNOSIS — R197 Diarrhea, unspecified: Secondary | ICD-10-CM | POA: Diagnosis not present

## 2014-12-05 DIAGNOSIS — M1990 Unspecified osteoarthritis, unspecified site: Secondary | ICD-10-CM | POA: Diagnosis not present

## 2014-12-05 DIAGNOSIS — N3001 Acute cystitis with hematuria: Secondary | ICD-10-CM

## 2014-12-05 DIAGNOSIS — Z79899 Other long term (current) drug therapy: Secondary | ICD-10-CM | POA: Diagnosis not present

## 2014-12-05 DIAGNOSIS — F419 Anxiety disorder, unspecified: Secondary | ICD-10-CM | POA: Insufficient documentation

## 2014-12-05 DIAGNOSIS — Z791 Long term (current) use of non-steroidal anti-inflammatories (NSAID): Secondary | ICD-10-CM | POA: Diagnosis not present

## 2014-12-05 LAB — CBC WITH DIFFERENTIAL/PLATELET
BASOS ABS: 0 10*3/uL (ref 0.0–0.1)
BASOS PCT: 0 %
EOS ABS: 0.1 10*3/uL (ref 0.0–0.7)
Eosinophils Relative: 1 %
HEMATOCRIT: 37.4 % (ref 36.0–46.0)
HEMOGLOBIN: 12.8 g/dL (ref 12.0–15.0)
Lymphocytes Relative: 35 %
Lymphs Abs: 2.4 10*3/uL (ref 0.7–4.0)
MCH: 30.5 pg (ref 26.0–34.0)
MCHC: 34.2 g/dL (ref 30.0–36.0)
MCV: 89 fL (ref 78.0–100.0)
Monocytes Absolute: 0.8 10*3/uL (ref 0.1–1.0)
Monocytes Relative: 11 %
NEUTROS ABS: 3.6 10*3/uL (ref 1.7–7.7)
NEUTROS PCT: 53 %
Platelets: 246 10*3/uL (ref 150–400)
RBC: 4.2 MIL/uL (ref 3.87–5.11)
RDW: 14.1 % (ref 11.5–15.5)
WBC: 6.8 10*3/uL (ref 4.0–10.5)

## 2014-12-05 LAB — URINALYSIS, ROUTINE W REFLEX MICROSCOPIC
GLUCOSE, UA: NEGATIVE mg/dL
LEUKOCYTES UA: NEGATIVE
NITRITE: POSITIVE — AB
PROTEIN: NEGATIVE mg/dL
Specific Gravity, Urine: 1.02 (ref 1.005–1.030)
Urobilinogen, UA: 0.2 mg/dL (ref 0.0–1.0)
pH: 6 (ref 5.0–8.0)

## 2014-12-05 LAB — HCG, SERUM, QUALITATIVE: Preg, Serum: NEGATIVE

## 2014-12-05 LAB — URINE MICROSCOPIC-ADD ON

## 2014-12-05 LAB — MAGNESIUM: Magnesium: 1.9 mg/dL (ref 1.7–2.4)

## 2014-12-05 LAB — COMPREHENSIVE METABOLIC PANEL
ALK PHOS: 71 U/L (ref 38–126)
ALT: 23 U/L (ref 14–54)
ANION GAP: 7 (ref 5–15)
AST: 19 U/L (ref 15–41)
Albumin: 3.2 g/dL — ABNORMAL LOW (ref 3.5–5.0)
BILIRUBIN TOTAL: 0.3 mg/dL (ref 0.3–1.2)
BUN: 22 mg/dL — AB (ref 6–20)
CALCIUM: 7.9 mg/dL — AB (ref 8.9–10.3)
CO2: 23 mmol/L (ref 22–32)
CREATININE: 0.76 mg/dL (ref 0.44–1.00)
Chloride: 107 mmol/L (ref 101–111)
GFR calc Af Amer: >60 mL/min (ref >60)
GFR calc non Af Amer: >60 mL/min (ref >60)
GLUCOSE: 99 mg/dL (ref 65–99)
Potassium: 3.1 mmol/L — ABNORMAL LOW (ref 3.5–5.1)
SODIUM: 137 mmol/L (ref 135–145)
TOTAL PROTEIN: 5.7 g/dL — AB (ref 6.5–8.1)

## 2014-12-05 LAB — LIPASE, BLOOD: Lipase: 20 U/L — ABNORMAL LOW (ref 22–51)

## 2014-12-05 LAB — PHOSPHORUS: PHOSPHORUS: 4.4 mg/dL (ref 2.5–4.6)

## 2014-12-05 MED ORDER — METOCLOPRAMIDE HCL 5 MG/ML IJ SOLN
10.0000 mg | Freq: Once | INTRAMUSCULAR | Status: AC
  Administered 2014-12-05: 10 mg via INTRAVENOUS
  Filled 2014-12-05: qty 2

## 2014-12-05 MED ORDER — MORPHINE SULFATE (PF) 4 MG/ML IV SOLN
4.0000 mg | Freq: Once | INTRAVENOUS | Status: AC
  Administered 2014-12-05: 4 mg via INTRAVENOUS
  Filled 2014-12-05: qty 1

## 2014-12-05 MED ORDER — CEPHALEXIN 500 MG PO CAPS: 500.0000 mg | ORAL_CAPSULE | Freq: Four times a day (QID) | ORAL | Status: DC

## 2014-12-05 MED ORDER — SODIUM CHLORIDE 0.9 % IV BOLUS (SEPSIS)
1000.0000 mL | Freq: Once | INTRAVENOUS | Status: AC
  Administered 2014-12-05: 1000 mL via INTRAVENOUS

## 2014-12-05 MED ORDER — OXYCODONE-ACETAMINOPHEN 5-325 MG PO TABS
1.0000 | ORAL_TABLET | Freq: Once | ORAL | Status: AC
  Administered 2014-12-05: 1 via ORAL
  Filled 2014-12-05: qty 1

## 2014-12-05 MED ORDER — DOXYCYCLINE HYCLATE 100 MG PO CAPS: 100.0000 mg | ORAL_CAPSULE | Freq: Two times a day (BID) | ORAL | Status: DC

## 2014-12-05 NOTE — ED Provider Notes (Signed)
CSN: [id number]     Arrival date & time 12/05/14  1028 History  By signing my name below, I, Bethany Barton, attest that this documentation has been prepared under the direction and in the presence of Bethany Guise, MD. Electronically Signed: [name], ED Scribe. 12/05/2014. 1:11 PM.  Chief Complaint  Patient presents with  . Abdominal Pain; toe pain    The history is provided by the patient. No language interpreter was used.   HPI Comments: Bethany Barton is a 44 y.o. Female with hx of HTN, peripheral neuropathy, DM, osteoarthritis, polysubstance abuse, and seizure who presents to the Emergency Department complaining of right great toe pain onset 2 weeks ago. Pt states that she hit her toe on a rock while having a seizure that lasted 3 minutes and now has a wound to the area that is not healing with yellow and red purulent drainage. Pt reports using Bactroban, epsom salts and Neosporin to no relief. She states that she has seen her PCP, but the treatments they have told her to do have not worked. Pt states that she had cellulitis in January and had adverse effects to the anti-biotics; states that she has not had any anti-biotics since her last hospital admission. Pt reports that she has intermittent abdominal pain with nausea and decreased appetite onset one month ago and diarrhea 5x a day. She states that she has had difficulty controlled bowel movements due to diarrhea and trouble keeping fluids down.  She states that she believes she had a seizure yesterday at work with focal activity for 4 minutes and vomiting afterwards. She is undergoing outpatient EEG and MRI for further seizure work-up next week. Also has follow-up appointment with GI for work-up of 4 weeks diarrhea next week. She reports that she is currently on Topamax, and has been compliant with medications. Pt states that she has recently had an increase in her Klonopin to 4x a day.   Past Medical History  Diagnosis Date  .  Hypertension   . Peripheral neuropathy (HCC)   . Facial tic   . Diabetes mellitus without complication (HCC)   . Polysubstance abuse     etoh, opiates  . Migraines   . Depression   . Anxiety   . Osteoarthritis   . Neuropathic pain of both feet (HCC)     chronic  . Cellulitis     Right foot  . Seizure [facility])    Past Surgical History  Procedure Laterality Date  . 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 Abdominal hysterectomy  2013  . Ankle fusion Right 1991  . Appendectomy  1987  . Cesarean section  1997  . Abdominoplasty  2013  . Cholecystectomy    . Incision and drainage abscess N/A 09/25/2013    Procedure: INCISION AND DRAINAGE ABSCESS;  Surgeon: Cherylynn Ridges, MD;  Location: [facility] OR;  Service: General;  Laterality: N/A;  . Cholecystectomy    . Esophagogastroduodenoscopy (egd) with propofol N/A 11/03/2014    Procedure: ESOPHAGOGASTRODUODENOSCOPY (EGD) WITH PROPOFOL;  Surgeon: Malissa Hippo, MD;  Location: [facility] ORS;  Service: Endoscopy;  Laterality: N/A;  anastomosis @ 35  . Esophageal biopsy N/A 11/03/2014    Procedure: BIOPSY;  Surgeon: Malissa Hippo, MD;  Location: [facility] ORS;  Service: Endoscopy;  Laterality: N/A;   Family History  Problem Relation Age of Onset  . Bipolar disorder Sister   . Drug abuse Sister   . Schizophrenia Son   .  Bipolar disorder Father   . Alcohol abuse Father   . Drug abuse Mother   . Alcohol abuse Mother    Social History  Substance Use Topics  . Smoking status: Never Smoker   . Smokeless tobacco: Never Used  . Alcohol Use: No     Comment: Denies alcohol since last June. /history of cocaine and narcotic dependence 07/25/13   OB History    No data available     Review of Systems  Gastrointestinal: Positive for nausea, vomiting, abdominal pain and diarrhea.  Musculoskeletal: Positive for arthralgias.  Skin: Positive for wound.  Neurological: Positive for seizures.  All other systems reviewed and are negative.  Allergies   Iodine; Lyrica; Neomycin; Nsaids; Phenergan; Sulfa antibiotics; and Hibiclens  Home Medications   Prior to Admission medications   Medication Sig Start Date End Date Taking? Authorizing Provider  acetaminophen (TYLENOL) 500 MG tablet Take 500 mg by mouth every 6 (six) hours as needed.   Yes Historical Provider, MD  aspirin-acetaminophen-caffeine (EXCEDRIN MIGRAINE) [phone] MG tablet Take 1 tablet by mouth every 6 (six) hours as needed for headache.   Yes Historical Provider, MD  clonazePAM (KLONOPIN) 0.5 MG tablet Take 0.5 mg by mouth 2 (two) times daily. 09/13/14  Yes Historical Provider, MD  cloNIDine (CATAPRES) 0.2 MG tablet Take 1 tablet (0.2 mg total) by mouth 2 (two) times daily. Patient taking differently: Take 0.2 mg by mouth 4 (four) times daily.  11/03/14  Yes Standley Brooking, MD  cyclobenzaprine (FLEXERIL) 10 MG tablet Take 1-2 at bedtime prn. 11/13/14  Yes Historical Provider, MD  diclofenac (VOLTAREN) 50 MG EC tablet Take 50 mg by mouth 2 (two) times daily.   Yes Historical Provider, MD  DULoxetine (CYMBALTA) 60 MG capsule Take 60 mg by mouth daily.   Yes Historical Provider, MD  eletriptan (RELPAX) 40 MG tablet Take 1 tablet (40 mg total) by mouth as needed for migraine or headache. One tablet by mouth at onset of headache. May repeat in 2 hours if headache persists or recurs. 11/12/13  Yes Levert Feinstein, MD  L-Methylfolate-B6-B12 (FOLTANX) 3-35-2 MG TABS Take 1 tablet by mouth 2 (two) times daily. 10/16/14  Yes Historical Provider, MD  Multiple Vitamin (MULTIVITAMIN WITH MINERALS) TABS tablet Take 1 tablet by mouth daily.   Yes Historical Provider, MD  NUVIGIL 200 MG TABS TAKE ONE TABLET BY MOUTH ONCE DAILY. 11/28/14  Yes Levert Feinstein, MD  ondansetron (ZOFRAN) 8 MG tablet Take 1 tablet (8 mg total) by mouth every 8 (eight) hours as needed for nausea or vomiting. 10/26/14  Yes Henderson Cloud, MD  Oxycodone HCl 10 MG TABS Take one tablet every four hours as needed for pain. 10/16/14   Yes Historical Provider, MD  predniSONE (DELTASONE) 10 MG tablet Take 1 tablet (10 mg total) by mouth daily with breakfast. 11/03/14  Yes Standley Brooking, MD  SUMAtriptan (IMITREX) 6 MG/0.5ML SOLN injection Inject 0.5 mLs (6 mg total) into the skin every 2 (two) hours as needed for migraine or headache. May repeat in 2 hours if headache persists or recurs. 11/19/14  Yes Levert Feinstein, MD  topiramate (TOPAMAX) 100 MG tablet Take 1 tablet (100 mg total) by mouth 2 (two) times daily. 11/19/14  Yes Levert Feinstein, MD  VITAMIN D, CHOLECALCIFEROL, PO Take 2,000 Units by mouth daily.   Yes Historical Provider, MD  cephALEXin (KEFLEX) 500 MG capsule Take 1 capsule (500 mg total) by mouth 4 (four) times daily. 12/05/14   Annabelle Harman  [name], MD  doxycycline (VIBRAMYCIN) 100 MG capsule Take 1 capsule (100 mg total) by mouth 2 (two) times daily. 12/05/14   Bethany Guise, MD  topiramate (TOPAMAX) 25 MG tablet One po bid xone week, then 2 tabs po bid, then 3 tabs po bid Patient not taking: Reported on 12/05/2014 11/19/14   Levert Feinstein, MD   BP 138/101 mmHg  Pulse 115  Temp(Src) 97.8 F (36.6 C) (Oral)  Resp 16  Wt 167 lb (75.751 kg)  SpO2 100% Physical Exam Physical Exam  Nursing note and vitals reviewed. Constitutional: Well developed, well nourished, non-toxic, and in no acute distress Head: Normocephalic and atraumatic.  Mouth/Throat: Oropharynx is clear and moist.  Neck: Normal range of motion. Neck supple.  Cardiovascular: Normal rate and regular rhythm.   Pulmonary/Chest: Effort normal and breath sounds normal.  Abdominal: Soft. There is no rebound and no guarding. LUQ tenderness to palpation; obese, distended abdomen Musculoskeletal: Normal range of motion. Dime sized skin tear with slight ulceration involving dorsal aspect of left great toe, no drainage, tenderness to palpation Neurological: Alert, no facial droop, fluent speech, moves all extremities symmetrically Skin: Skin is warm and dry.  Psychiatric:  Cooperative  ED Course  Procedures (including critical care time) DIAGNOSTIC STUDIES: Oxygen Saturation is 100% on RA, normal by my interpretation.    COORDINATION OF CARE: 11:46 AM-Discussed treatment plan which includes labs and x-ray with pt at bedside and pt agreed to plan.   Labs Review Labs Reviewed  URINALYSIS, ROUTINE W REFLEX MICROSCOPIC (NOT AT [facility]) - Abnormal; Notable for the following:    Hgb urine dipstick MODERATE (*)    Bilirubin Urine SMALL (*)    Ketones, ur TRACE (*)    Nitrite POSITIVE (*)    All other components within normal limits  COMPREHENSIVE METABOLIC PANEL - Abnormal; Notable for the following:    Potassium 3.1 (*)    BUN 22 (*)    Calcium 7.9 (*)    Total Protein 5.7 (*)    Albumin 3.2 (*)    All other components within normal limits  LIPASE, BLOOD - Abnormal; Notable for the following:    Lipase 20 (*)    All other components within normal limits  URINE MICROSCOPIC-ADD ON - Abnormal; Notable for the following:    Bacteria, UA MANY (*)    All other components within normal limits  C DIFFICILE QUICK SCREEN W PCR REFLEX  URINE CULTURE  HCG, SERUM, QUALITATIVE  CBC WITH DIFFERENTIAL/PLATELET  MAGNESIUM  PHOSPHORUS   Imaging Review Dg Foot Complete Left  12/05/2014   CLINICAL DATA:  Ulcer on top of left great toe for 2 weeks, after an injury.  EXAM: LEFT FOOT - COMPLETE 3+ VIEW  COMPARISON:  None.  FINDINGS: Three views of the left foot are provided. Osseous alignment is normal. Bone mineralization is normal. No fracture line or displaced fracture fragment identified.  There are no destructive changes or demineralization to suggest osteomyelitis. Soft tissues about the left great toe are unremarkable. No abnormal soft tissue gas seen. No radiodense foreign body identified in the soft tissues.  IMPRESSION: Unremarkable plain film examination of the left foot. No acute findings.   Electronically Signed   By: Bary Richard M.D.   On: 12/05/2014 12:35      MDM   Final diagnoses:  Diabetic ulcer of toe (HCC)  Diarrhea, unspecified type  Acute cystitis with hematuria    In of 44 year old female who presents with 4 weeks of  diarrhea, 2 weeks of left toe pain, and intermittent seizures. She is tachycardic on presentation with heart rate in the 110s, but normotensive and appears well perfused. Some dry mucous membranes are noted on exam. Likely dehydrated from significant diarrhea over the past month. Blood work does not show significant acute kidney injury or significant electrolyte or metabolic derangements. Evidence of UTI with hematuria, and she will be treated with course of antibiotics. States chronic hematuria and has outpatient follow-up with urology for additional workup of this. Evidence of diabetic foot ulcer overlying the left great toe, and given increasing pain with questionable drainage I will treat for diabetic ulcer infection. Some overlying erythema is noted, and she'll be given a course of Keflex and doxycycline which will also cover for her UTI. She has follow-up next week with gastroenterology for workup of her 4 weeks of diarrhea. She also has upcoming EEG and MRI with her neurologist for additional outpatient workup of her seizures. At this time she will maintain on her Topamax and will avoid driving until cleared by her neurologist. She has had CT abdomen pelvis during the course of her current illness, and symptoms are not changed. I do not feel that she requires any additional imaging. Unable to provide stool sample here, and she has agreed to obtain stool sample to give to her gastroenterologist to evaluate for C. difficile and any other stool studies as needed. She feels improved after antiemetics and IV fluids. She is able to tolerate by mouth intake. She will follow-up closely as an outpatient with her PCP as well as her other specialists. Strict return instructions reviewed. She expressed understanding of all discharge  instructions for comfortable to plan of care.    I personally performed the services described in this documentation, which was scribed in my presence. The recorded information has been reviewed and is accurate.    Bethany Guise, MD 12/05/14 [phone]

## 2014-12-05 NOTE — Discharge Instructions (Signed)
Please follow-up with your gastroenterologist and urologist as scheduled. Return without fail for worsening symptoms including fevers, vomiting unable to keep down food or fluids, worsening pain, or any other symptoms concerning to you.  Diarrhea Diarrhea is watery poop (stool). It can make you feel weak, tired, thirsty, or give you a dry mouth (signs of dehydration). Watery poop is a sign of another problem, most often an infection. It often lasts 2-3 days. It can last longer if it is a sign of something serious. Take care of yourself as told by your doctor. HOME CARE   Drink 1 cup (8 ounces) of fluid each time you have watery poop.  Do not drink the following fluids:  Those that contain simple sugars (fructose, glucose, galactose, lactose, sucrose, maltose).  Sports drinks.  Fruit juices.  Whole milk products.  Sodas.  Drinks with caffeine (coffee, tea, soda) or alcohol.  Oral rehydration solution may be used if the doctor says it is okay. You may make your own solution. Follow this recipe:   - teaspoon table salt.   teaspoon baking soda.   teaspoon salt substitute containing potassium chloride.  1 tablespoons sugar.  1 liter (34 ounces) of water.  Avoid the following foods:  High fiber foods, such as raw fruits and vegetables.  Nuts, seeds, and whole grain breads and cereals.   Those that are sweetened with sugar alcohols (xylitol, sorbitol, mannitol).  Try eating the following foods:  Starchy foods, such as rice, toast, pasta, low-sugar cereal, oatmeal, baked potatoes, crackers, and bagels.  Bananas.  Applesauce.  Eat probiotic-rich foods, such as yogurt and milk products that are fermented.  Wash your hands well after each time you have watery poop.  Only take medicine as told by your doctor.  Take a warm bath to help lessen burning or pain from having watery poop. GET HELP RIGHT AWAY IF:   You cannot drink fluids without throwing up (vomiting).  You  keep throwing up.  You have blood in your poop, or your poop looks black and tarry.  You do not pee (urinate) in 6-8 hours, or there is only a small amount of very dark pee.  You have belly (abdominal) pain that gets worse or stays in the same spot (localizes).  You are weak, dizzy, confused, or light-headed.  You have a very bad headache.  Your watery poop gets worse or does not get better.  You have a fever or lasting symptoms for more than 2-3 days.  You have a fever and your symptoms suddenly get worse. MAKE SURE YOU:   Understand these instructions.  Will watch your condition.  Will get help right away if you are not doing well or get worse.   This information is not intended to replace advice given to you by your health care provider. Make sure you discuss any questions you have with your health care provider.   Document Released: 08/04/2007 Document Revised: 03/08/2014 Document Reviewed: 10/24/2011 Elsevier Interactive Patient Education [employer]

## 2014-12-05 NOTE — ED Notes (Signed)
Instructed by MD to send a sterile cup home with pt so she can obtain stool sample and take it too her Gastroenterologist office -- Last C diff test on stoll when she was in hospital was negative ( per pt )

## 2014-12-05 NOTE — ED Notes (Addendum)
Patient reports toe pain to right great toe after hitting toe on rock. Small wound noted to right great toe. Patient also reports abd pain with nausea and thinks she may be having seizures at night.

## 2014-12-05 NOTE — ED Notes (Signed)
Discharge instructions reviewed with pt.

## 2014-12-06 ENCOUNTER — Encounter (HOSPITAL_COMMUNITY): Payer: Self-pay | Admitting: Emergency Medicine

## 2014-12-06 ENCOUNTER — Emergency Department (HOSPITAL_COMMUNITY)
Admission: EM | Admit: 2014-12-06 | Discharge: 2014-12-06 | Disposition: A | Discharge status: Home / Self Care | Payer: 59 | Attending: Emergency Medicine | Admitting: Emergency Medicine

## 2014-12-06 ENCOUNTER — Emergency Department (HOSPITAL_COMMUNITY): Payer: 59

## 2014-12-06 DIAGNOSIS — E119 Type 2 diabetes mellitus without complications: Secondary | ICD-10-CM | POA: Insufficient documentation

## 2014-12-06 DIAGNOSIS — R42 Dizziness and giddiness: Secondary | ICD-10-CM | POA: Diagnosis not present

## 2014-12-06 DIAGNOSIS — R1084 Generalized abdominal pain: Secondary | ICD-10-CM | POA: Insufficient documentation

## 2014-12-06 DIAGNOSIS — R55 Syncope and collapse: Secondary | ICD-10-CM | POA: Insufficient documentation

## 2014-12-06 DIAGNOSIS — G40909 Epilepsy, unspecified, not intractable, without status epilepticus: Secondary | ICD-10-CM | POA: Insufficient documentation

## 2014-12-06 DIAGNOSIS — G43909 Migraine, unspecified, not intractable, without status migrainosus: Secondary | ICD-10-CM | POA: Insufficient documentation

## 2014-12-06 DIAGNOSIS — Z79899 Other long term (current) drug therapy: Secondary | ICD-10-CM | POA: Insufficient documentation

## 2014-12-06 DIAGNOSIS — R112 Nausea with vomiting, unspecified: Secondary | ICD-10-CM | POA: Diagnosis not present

## 2014-12-06 DIAGNOSIS — R5383 Other fatigue: Secondary | ICD-10-CM | POA: Diagnosis not present

## 2014-12-06 DIAGNOSIS — M1990 Unspecified osteoarthritis, unspecified site: Secondary | ICD-10-CM | POA: Diagnosis not present

## 2014-12-06 DIAGNOSIS — Z872 Personal history of diseases of the skin and subcutaneous tissue: Secondary | ICD-10-CM | POA: Diagnosis not present

## 2014-12-06 DIAGNOSIS — I10 Essential (primary) hypertension: Secondary | ICD-10-CM | POA: Diagnosis not present

## 2014-12-06 DIAGNOSIS — G629 Polyneuropathy, unspecified: Secondary | ICD-10-CM | POA: Insufficient documentation

## 2014-12-06 DIAGNOSIS — R197 Diarrhea, unspecified: Secondary | ICD-10-CM | POA: Diagnosis not present

## 2014-12-06 DIAGNOSIS — F419 Anxiety disorder, unspecified: Secondary | ICD-10-CM | POA: Insufficient documentation

## 2014-12-06 DIAGNOSIS — Z7952 Long term (current) use of systemic steroids: Secondary | ICD-10-CM | POA: Insufficient documentation

## 2014-12-06 LAB — BASIC METABOLIC PANEL
Anion gap: 8 (ref 5–15)
BUN: 12 mg/dL (ref 6–20)
CALCIUM: 8.6 mg/dL — AB (ref 8.9–10.3)
CHLORIDE: 111 mmol/L (ref 101–111)
CO2: 24 mmol/L (ref 22–32)
CREATININE: 0.7 mg/dL (ref 0.44–1.00)
GFR calc Af Amer: >60 mL/min (ref >60)
GFR calc non Af Amer: >60 mL/min (ref >60)
GLUCOSE: 91 mg/dL (ref 65–99)
Potassium: 3.1 mmol/L — ABNORMAL LOW (ref 3.5–5.1)
Sodium: 143 mmol/L (ref 135–145)

## 2014-12-06 LAB — CBC WITH DIFFERENTIAL/PLATELET
BASOS PCT: 0 %
Basophils Absolute: 0 10*3/uL (ref 0.0–0.1)
Eosinophils Absolute: 0.1 10*3/uL (ref 0.0–0.7)
Eosinophils Relative: 1 %
HEMATOCRIT: 40.3 % (ref 36.0–46.0)
HEMOGLOBIN: 13.8 g/dL (ref 12.0–15.0)
LYMPHS ABS: 1.5 10*3/uL (ref 0.7–4.0)
Lymphocytes Relative: 25 %
MCH: 30.6 pg (ref 26.0–34.0)
MCHC: 34.2 g/dL (ref 30.0–36.0)
MCV: 89.4 fL (ref 78.0–100.0)
MONO ABS: 0.5 10*3/uL (ref 0.1–1.0)
MONOS PCT: 8 %
NEUTROS ABS: 3.8 10*3/uL (ref 1.7–7.7)
NEUTROS PCT: 66 %
Platelets: 253 10*3/uL (ref 150–400)
RBC: 4.51 MIL/uL (ref 3.87–5.11)
RDW: 14 % (ref 11.5–15.5)
WBC: 5.9 10*3/uL (ref 4.0–10.5)

## 2014-12-06 MED ORDER — ACETAMINOPHEN 500 MG PO TABS
500.0000 mg | ORAL_TABLET | Freq: Once | ORAL | Status: AC
  Administered 2014-12-06: 500 mg via ORAL
  Filled 2014-12-06: qty 1

## 2014-12-06 MED ORDER — SODIUM CHLORIDE 0.9 % IV SOLN
INTRAVENOUS | Status: DC
  Administered 2014-12-06: 17:00:00 via INTRAVENOUS

## 2014-12-06 MED ORDER — CLONIDINE HCL 0.2 MG PO TABS
0.2000 mg | ORAL_TABLET | Freq: Once | ORAL | Status: AC
Start: 2014-12-06 — End: 2014-12-06
  Administered 2014-12-06: 0.2 mg via ORAL
  Filled 2014-12-06: qty 1

## 2014-12-06 MED ORDER — SODIUM CHLORIDE 0.9 % IV BOLUS (SEPSIS)
1000.0000 mL | Freq: Once | INTRAVENOUS | Status: AC
Start: 2014-12-06 — End: 2014-12-06
  Administered 2014-12-06: 1000 mL via INTRAVENOUS

## 2014-12-06 MED ORDER — PREDNISONE 10 MG PO TABS: 10.0000 mg | ORAL_TABLET | Freq: Every day | ORAL | Status: DC

## 2014-12-06 NOTE — ED Provider Notes (Signed)
CSN: [id number]     Arrival date & time 12/06/14  1324 History   First MD Initiated Contact with Patient 12/06/14 1350     Chief Complaint  Patient presents with  . Fall     (Consider location/radiation/quality/duration/timing/severity/associated sxs/prior Treatment) Patient is a 44 y.o. female presenting with fall. The history is provided by the patient and the EMS personnel.  Fall Associated symptoms include abdominal pain. Pertinent negatives include no chest pain, no headaches and no shortness of breath.   patient seen yesterday in the emergency department for wound to the left great toe. She is on Anaprox for this was seen by her primary care doctor in the office this morning. Given a shot of Solu-Medrol. Patient is on antibodies. Patient was seen in the emergency department yesterday she is known to have a history of hypertension peripheral neuropathy diabetes osteoarthritis polysubstance abuse and seizure yesterday she presented for complaint of the toe hurting.  After returning from her primary care's office. She was bending over underneath the kitchen cabinet under the sink to get some cleaning materials and she has syncopal episode. When she awoke she called EMS.  Agent now without any new complaints. Patient known to have chronic abdominal problems followed by GI medicine. These them and going on for weeks.  Past Medical History  Diagnosis Date  . Hypertension   . Peripheral neuropathy (HCC)   . Facial tic   . Diabetes mellitus without complication (HCC)   . Polysubstance abuse     etoh, opiates  . Migraines   . Depression   . Anxiety   . Osteoarthritis   . Neuropathic pain of both feet (HCC)     chronic  . Cellulitis     Right foot  . Seizure [facility])    Past Surgical History  Procedure Laterality Date  . 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 Abdominal hysterectomy  2013  . Ankle fusion Right 1991  . Appendectomy  1987  . Cesarean section   1997  . Abdominoplasty  2013  . Cholecystectomy    . Incision and drainage abscess N/A 09/25/2013    Procedure: INCISION AND DRAINAGE ABSCESS;  Surgeon: Cherylynn Ridges, MD;  Location: [facility] OR;  Service: General;  Laterality: N/A;  . Cholecystectomy    . Esophagogastroduodenoscopy (egd) with propofol N/A 11/03/2014    Procedure: ESOPHAGOGASTRODUODENOSCOPY (EGD) WITH PROPOFOL;  Surgeon: Malissa Hippo, MD;  Location: [facility] ORS;  Service: Endoscopy;  Laterality: N/A;  anastomosis @ 35  . Esophageal biopsy N/A 11/03/2014    Procedure: BIOPSY;  Surgeon: Malissa Hippo, MD;  Location: [facility] ORS;  Service: Endoscopy;  Laterality: N/A;   Family History  Problem Relation Age of Onset  . Bipolar disorder Sister   . Drug abuse Sister   . Schizophrenia Son   . Bipolar disorder Father   . Alcohol abuse Father   . Drug abuse Mother   . Alcohol abuse Mother    Social History  Substance Use Topics  . Smoking status: Never Smoker   . Smokeless tobacco: Never Used  . Alcohol Use: No     Comment: Denies alcohol since last June. /history of cocaine and narcotic dependence 07/25/13   OB History    No data available     Review of Systems  Constitutional: Positive for fatigue. Negative for fever.  HENT: Negative for congestion.   Eyes: Negative for visual disturbance.  Respiratory: Negative for shortness of breath.  Cardiovascular: Negative for chest pain.  Gastrointestinal: Positive for nausea, vomiting, abdominal pain and diarrhea.  Genitourinary: Negative for dysuria.  Musculoskeletal: Negative for back pain.  Skin: Positive for wound. Negative for rash.  Neurological: Positive for syncope and light-headedness. Negative for headaches.  Hematological: Does not bruise/bleed easily.  Psychiatric/Behavioral: Negative for confusion.      Allergies  Doxycycline; Keflex; Iodine; Lyrica; Neomycin; Nsaids; Phenergan; Sulfa antibiotics; and Hibiclens  Home Medications   Prior to Admission medications    Medication Sig Start Date End Date Taking? Authorizing Provider  acetaminophen (TYLENOL) 500 MG tablet Take 1,000 mg by mouth every 6 (six) hours as needed for moderate pain.    Yes Historical Provider, MD  aspirin-acetaminophen-caffeine (EXCEDRIN MIGRAINE) [phone] MG tablet Take 1 tablet by mouth every 6 (six) hours as needed for headache.   Yes Historical Provider, MD  clonazePAM (KLONOPIN) 0.5 MG tablet Take 0.25 mg by mouth 2 (two) times daily.  09/13/14  Yes Historical Provider, MD  cloNIDine (CATAPRES) 0.2 MG tablet Take 1 tablet (0.2 mg total) by mouth 2 (two) times daily. Patient taking differently: Take 0.2 mg by mouth 4 (four) times daily.  11/03/14  Yes Standley Brooking, MD  diclofenac (VOLTAREN) 50 MG EC tablet Take 50 mg by mouth 2 (two) times daily as needed for moderate pain.    Yes Historical Provider, MD  DULoxetine (CYMBALTA) 60 MG capsule Take 60 mg by mouth daily.   Yes Historical Provider, MD  eletriptan (RELPAX) 40 MG tablet Take 1 tablet (40 mg total) by mouth as needed for migraine or headache. One tablet by mouth at onset of headache. May repeat in 2 hours if headache persists or recurs. 11/12/13  Yes Levert Feinstein, MD  L-Methylfolate-B6-B12 (FOLTANX) 3-35-2 MG TABS Take 1 tablet by mouth 2 (two) times daily. 10/16/14  Yes Historical Provider, MD  Multiple Vitamin (MULTIVITAMIN WITH MINERALS) TABS tablet Take 1 tablet by mouth daily.   Yes Historical Provider, MD  NUVIGIL 200 MG TABS TAKE ONE TABLET BY MOUTH ONCE DAILY. 11/28/14  Yes Levert Feinstein, MD  ondansetron (ZOFRAN) 8 MG tablet Take 1 tablet (8 mg total) by mouth every 8 (eight) hours as needed for nausea or vomiting. 10/26/14  Yes Henderson Cloud, MD  Oxycodone HCl 10 MG TABS Take one tablet every four hours as needed for pain. 10/16/14  Yes Historical Provider, MD  predniSONE (DELTASONE) 10 MG tablet Take 1 tablet (10 mg total) by mouth daily with breakfast. 11/03/14  Yes Standley Brooking, MD  SUMAtriptan (IMITREX) 6  MG/0.5ML SOLN injection Inject 0.5 mLs (6 mg total) into the skin every 2 (two) hours as needed for migraine or headache. May repeat in 2 hours if headache persists or recurs. 11/19/14  Yes Levert Feinstein, MD  topiramate (TOPAMAX) 25 MG tablet One po bid xone week, then 2 tabs po bid, then 3 tabs po bid Patient taking differently: Take 50 mg by mouth 4 (four) times daily. One po bid xone week, then 2 tabs po bid, then 3 tabs po bid 11/19/14  Yes Levert Feinstein, MD  VITAMIN D, CHOLECALCIFEROL, PO Take 2,000 Units by mouth daily.   Yes Historical Provider, MD  cephALEXin (KEFLEX) 500 MG capsule Take 1 capsule (500 mg total) by mouth 4 (four) times daily. Patient not taking: Reported on 12/06/2014 12/05/14   [name], MD  doxycycline (VIBRAMYCIN) 100 MG capsule Take 1 capsule (100 mg total) by mouth 2 (two) times daily. Patient not taking:  Reported on 12/06/2014 12/05/14   [name], MD  predniSONE (DELTASONE) 10 MG tablet Take 1 tablet (10 mg total) by mouth daily with breakfast. 12/06/14   Vanetta Mulders, MD  topiramate (TOPAMAX) 100 MG tablet Take 1 tablet (100 mg total) by mouth 2 (two) times daily. Patient not taking: Reported on 12/06/2014 11/19/14   Levert Feinstein, MD   BP 154/100 mmHg  Pulse 79  Temp(Src) 97.7 F (36.5 C) (Oral)  Resp 19  Ht 5\' 3"  (1.6 m)  Wt 166 lb (75.297 kg)  BMI 29.41 kg/m2  SpO2 100% Physical Exam  Constitutional: She is oriented to person, place, and time. She appears well-developed and well-nourished. No distress.  HENT:  Head: Normocephalic and atraumatic.  Mouth/Throat: Oropharynx is clear and moist.  Eyes: Conjunctivae and EOM are normal. Pupils are equal, round, and reactive to light.  Neck: Normal range of motion. Neck supple.  Cardiovascular: Normal rate, regular rhythm and normal heart sounds.   No murmur heard. Pulmonary/Chest: Breath sounds normal. No respiratory distress.  Abdominal: Soft. Bowel sounds are normal. She exhibits no distension. There is tenderness.   Generalized tenderness no guarding mild in nature. No distention.  Musculoskeletal: Normal range of motion.  Left great toe with the bandage on it no particular drainage. This was evaluated by her primary care doctor this morning.  Neurological: She is alert and oriented to person, place, and time. No cranial nerve deficit. She exhibits normal muscle tone. Coordination normal.  Nursing note and vitals reviewed.   ED Course  Procedures (including critical care time) Labs Review Labs Reviewed  BASIC METABOLIC PANEL - Abnormal; Notable for the following:    Potassium 3.1 (*)    Calcium 8.6 (*)    All other components within normal limits  CBC WITH DIFFERENTIAL/PLATELET   Results for orders placed or performed during the hospital encounter of 12/06/14  CBC with Differential/Platelet  Result Value Ref Range   WBC 5.9 4.0 - 10.5 K/uL   RBC 4.51 3.87 - 5.11 MIL/uL   Hemoglobin 13.8 12.0 - 15.0 g/dL   HCT 96.0 45.4 - 09.8 %   MCV 89.4 78.0 - 100.0 fL   MCH 30.6 26.0 - 34.0 pg   MCHC 34.2 30.0 - 36.0 g/dL   RDW 11.9 14.7 - 82.9 %   Platelets 253 150 - 400 K/uL   Neutrophils Relative % 66 %   Neutro Abs 3.8 1.7 - 7.7 K/uL   Lymphocytes Relative 25 %   Lymphs Abs 1.5 0.7 - 4.0 K/uL   Monocytes Relative 8 %   Monocytes Absolute 0.5 0.1 - 1.0 K/uL   Eosinophils Relative 1 %   Eosinophils Absolute 0.1 0.0 - 0.7 K/uL   Basophils Relative 0 %   Basophils Absolute 0.0 0.0 - 0.1 K/uL  Basic metabolic panel  Result Value Ref Range   Sodium 143 135 - 145 mmol/L   Potassium 3.1 (L) 3.5 - 5.1 mmol/L   Chloride 111 101 - 111 mmol/L   CO2 24 22 - 32 mmol/L   Glucose, Bld 91 65 - 99 mg/dL   BUN 12 6 - 20 mg/dL   Creatinine, Ser 5.62 0.44 - 1.00 mg/dL   Calcium 8.6 (L) 8.9 - 10.3 mg/dL   GFR calc non Af Amer >60 >60 mL/min   GFR calc Af Amer >60 >60 mL/min   Anion gap 8 5 - 15     Imaging Review Ct Head Wo Contrast  12/06/2014   CLINICAL DATA:  Syncope when reaching under the sink  today, nausea, vomiting, abdominal pain  EXAM: CT HEAD WITHOUT CONTRAST  TECHNIQUE: Contiguous axial images were obtained from the base of the skull through the vertex without intravenous contrast.  COMPARISON:  11/22/2014  FINDINGS: Normal ventricular morphology.  No midline shift or mass effect.  Normal appearance of brain parenchyma.  No intracranial hemorrhage, mass lesion or evidence of acute infarction.  No extra-axial fluid collections.  Bones and sinuses unremarkable.  IMPRESSION: No acute intracranial abnormalities.   Electronically Signed   By: Ulyses Southward M.D.   On: 12/06/2014 16:31   Dg Abd Acute W/chest  12/06/2014   CLINICAL DATA:  Abdominal pain beginning today.  Fall at home.  EXAM: DG ABDOMEN ACUTE W/ 1V CHEST  COMPARISON:  Chest radiography 11/22/2014.  CT abdomen 10/30/2014.  FINDINGS: Bowel gas pattern is normal without evidence of ileus, obstruction or free air. Previous gastric bypass surgery. No significant bone finding.  One-view chest shows normal heart and mediastinal shadows. Lungs are clear. No effusions. No free air under the diaphragm.  IMPRESSION: Negative acute abdominal series.  Previous gastric bypass surgery.   Electronically Signed   By: Paulina Fusi M.D.   On: 12/06/2014 16:01   Dg Foot Complete Left  12/05/2014   CLINICAL DATA:  Ulcer on top of left great toe for 2 weeks, after an injury.  EXAM: LEFT FOOT - COMPLETE 3+ VIEW  COMPARISON:  None.  FINDINGS: Three views of the left foot are provided. Osseous alignment is normal. Bone mineralization is normal. No fracture line or displaced fracture fragment identified.  There are no destructive changes or demineralization to suggest osteomyelitis. Soft tissues about the left great toe are unremarkable. No abnormal soft tissue gas seen. No radiodense foreign body identified in the soft tissues.  IMPRESSION: Unremarkable plain film examination of the left foot. No acute findings.   Electronically Signed   By: Bary Richard M.D.    On: 12/05/2014 12:35   I have personally reviewed and evaluated these images and lab results as part of my medical decision-making.   EKG Interpretation   Date/Time:  Friday December 06 2014 14:34:33 EDT Ventricular Rate:  104 PR Interval:  166 QRS Duration: 95 QT Interval:  353 QTC Calculation: 464 R Axis:   113 Text Interpretation:  Sinus tachycardia Inferior infarct, old Consider  anterolateral infarct No significant change since last tracing Reconfirmed  by Tattiana Fakhouri  MD, Anissia Wessells [phone]) on 12/06/2014 2:56:51 PM      MDM   Final diagnoses:  Syncope, unspecified syncope type   Patient workup for the base in the left episode at home without any acute findings. Patient was bending over under the sink when she passed out. Head CT negative EKG without arrhythmias cardiac monitoring here without arrhythmias. Chest x-ray and abdominal series without any acute findings. Patient has a history of chronic abdominal problems. Patient also supposedly has a history of adrenal insufficiency was in to see her primary care doctor today she was given a shot of Solu-Medrol. Patient is having treatment with antibodies for a toe infection. Patient did not take her clonidine or other blood pressure medicines. Her blood pressure has been elevated here she will need to restart her meds and follow-up with her regular doctor to make sure that they control her blood pressure. Patient will have her prednisone renewed. No leukocytosis no significant anemia no significant electrolyte abnormalities other than a mild hypokalemia that was present when patient was seen yesterday.  Patient is to follow-up with her GI doctor and her primary care doctor.     Vanetta Mulders, MD 12/06/14 1743

## 2014-12-06 NOTE — ED Notes (Signed)
Patient arrives via EMS from home with c/o fall and LOC today. EMS reports alert and oriented on scene. Patient on keflex and doxycyline for left great toe wound. Patient states she was "up all night due to the antibiotics." Reports n/v/d. Seen at Dr Sharyon Medicus office today and given Solu-medrol IM at his office. Patient went home and was reaching under the sink and passed out. Patient reports calling EMS upon awaking.

## 2014-12-06 NOTE — Discharge Instructions (Signed)
Follow-up with your primary care doctor and GI doctor. Return for any new or worse symptoms.

## 2014-12-07 ENCOUNTER — Other Ambulatory Visit: Payer: Self-pay | Admitting: Neurology

## 2014-12-07 LAB — URINE CULTURE

## 2014-12-08 NOTE — ED Notes (Signed)
(+)  urine culture treated with Cephalexin, OK per T. Kirichenko 

## 2014-12-10 ENCOUNTER — Other Ambulatory Visit: Payer: Self-pay | Admitting: Neurology

## 2014-12-10 NOTE — Telephone Encounter (Signed)
Last OV says Sumatriptan PRN.  Unsure if you want her to continue Relpax if needed when Imitrex is not used.  Thank you.

## 2014-12-11 ENCOUNTER — Other Ambulatory Visit: Payer: 59

## 2014-12-11 ENCOUNTER — Other Ambulatory Visit: Payer: Self-pay | Admitting: *Deleted

## 2014-12-11 ENCOUNTER — Telehealth: Payer: Self-pay

## 2014-12-11 DIAGNOSIS — R569 Unspecified convulsions: Secondary | ICD-10-CM

## 2014-12-11 NOTE — Telephone Encounter (Signed)
Bethany RiasBrandy Barton [facility] I think you have to [facility] or call them and she is also needing to talk to some one about her issues and meds. Please advise

## 2014-12-11 NOTE — Telephone Encounter (Signed)
Her [facility] has [facility] (arrival time 12:15 for a 12:30 appointment).  She will also keep her MRI and follow up appts.  She will continue Topamax as prescribed until she comes back in to see Dr. Terrace ArabiaYan.  She will call us with any new concerns.

## 2014-12-11 NOTE — Telephone Encounter (Signed)
Left message for a return call

## 2014-12-11 NOTE — Telephone Encounter (Signed)
Left message for a return call - need to discuss appt details for EEG and follow up with patient.

## 2014-12-12 ENCOUNTER — Telehealth: Payer: Self-pay | Admitting: Gastroenterology

## 2014-12-12 ENCOUNTER — Ambulatory Visit: Payer: Self-pay | Admitting: Gastroenterology

## 2014-12-12 ENCOUNTER — Encounter: Payer: Self-pay | Admitting: Gastroenterology

## 2014-12-12 NOTE — Telephone Encounter (Signed)
PATIENT WAS A NO SHOW AND LETTER SENT  °

## 2014-12-17 ENCOUNTER — Encounter: Payer: Self-pay | Admitting: Nurse Practitioner

## 2014-12-17 ENCOUNTER — Ambulatory Visit (INDEPENDENT_AMBULATORY_CARE_PROVIDER_SITE_OTHER): Payer: 59 | Admitting: Nurse Practitioner

## 2014-12-17 VITALS — BP 151/101 | HR 94 | Temp 97.8°F | Ht 63.0 in | Wt 165.2 lb

## 2014-12-17 DIAGNOSIS — R197 Diarrhea, unspecified: Secondary | ICD-10-CM

## 2014-12-17 DIAGNOSIS — K567 Ileus, unspecified: Secondary | ICD-10-CM

## 2014-12-17 DIAGNOSIS — R110 Nausea: Secondary | ICD-10-CM | POA: Insufficient documentation

## 2014-12-17 NOTE — Assessment & Plan Note (Signed)
Symptoms resolved. Will notify us of any recurrence.
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
## 2014-12-17 NOTE — Patient Instructions (Signed)
1. We will contact her neurologist regarding the risks of using Zofran. 2. Return for follow-up in 3 months.

## 2014-12-17 NOTE — Assessment & Plan Note (Signed)
Vomiting improved. Persistent nausea. Is worried about taking Reglan due to side effects, was told that Zofran has been linked to seizures for which she has a history of and is reluctant to take this on the advice of her neurologist. Compazine is technically an antipsychotic and I'm not comfortable starting this, and she has an allergy to Phenergan with altered mental status. Additionally, Phenergan is likely increased risk due to her chronic pain medications and Klonopin. At this point, since she states her neurologist raised the issue of seizures with Zofran, I will message her neurologist Dr. Terrace ArabiaYan for their input. Return for follow-up in 3 months.

## 2014-12-17 NOTE — Assessment & Plan Note (Signed)
Status post hospitalization for ileus. Symptoms including abdominal pain and diarrhea have resolved. However current GI symptoms. Only remaining symptom is nausea for which we are seeking treatment 4. See notes below for details. Follow-up in 3 months.

## 2014-12-18 ENCOUNTER — Emergency Department (HOSPITAL_COMMUNITY)
Admission: EM | Admit: 2014-12-18 | Discharge: 2014-12-18 | Disposition: A | Discharge status: Home / Self Care | Payer: 59 | Attending: Emergency Medicine | Admitting: Emergency Medicine

## 2014-12-18 ENCOUNTER — Ambulatory Visit (HOSPITAL_COMMUNITY)
Admission: RE | Admit: 2014-12-18 | Discharge: 2014-12-18 | Disposition: A | Discharge status: Home / Self Care | Payer: 59 | Source: Ambulatory Visit | Attending: Neurology | Admitting: Neurology

## 2014-12-18 ENCOUNTER — Ambulatory Visit (INDEPENDENT_AMBULATORY_CARE_PROVIDER_SITE_OTHER): Payer: 59

## 2014-12-18 ENCOUNTER — Telehealth: Payer: Self-pay | Admitting: Neurology

## 2014-12-18 ENCOUNTER — Ambulatory Visit: Payer: 59

## 2014-12-18 ENCOUNTER — Telehealth: Payer: Self-pay | Admitting: Nurse Practitioner

## 2014-12-18 ENCOUNTER — Encounter (HOSPITAL_COMMUNITY): Payer: Self-pay | Admitting: Emergency Medicine

## 2014-12-18 ENCOUNTER — Ambulatory Visit: Payer: Self-pay | Admitting: Neurology

## 2014-12-18 DIAGNOSIS — Z872 Personal history of diseases of the skin and subcutaneous tissue: Secondary | ICD-10-CM | POA: Diagnosis not present

## 2014-12-18 DIAGNOSIS — G40909 Epilepsy, unspecified, not intractable, without status epilepticus: Secondary | ICD-10-CM | POA: Insufficient documentation

## 2014-12-18 DIAGNOSIS — R569 Unspecified convulsions: Secondary | ICD-10-CM | POA: Insufficient documentation

## 2014-12-18 DIAGNOSIS — F329 Major depressive disorder, single episode, unspecified: Secondary | ICD-10-CM | POA: Diagnosis not present

## 2014-12-18 DIAGNOSIS — E119 Type 2 diabetes mellitus without complications: Secondary | ICD-10-CM | POA: Insufficient documentation

## 2014-12-18 DIAGNOSIS — G629 Polyneuropathy, unspecified: Secondary | ICD-10-CM | POA: Insufficient documentation

## 2014-12-18 DIAGNOSIS — Z8719 Personal history of other diseases of the digestive system: Secondary | ICD-10-CM | POA: Diagnosis not present

## 2014-12-18 DIAGNOSIS — G43909 Migraine, unspecified, not intractable, without status migrainosus: Secondary | ICD-10-CM | POA: Insufficient documentation

## 2014-12-18 DIAGNOSIS — I10 Essential (primary) hypertension: Secondary | ICD-10-CM | POA: Insufficient documentation

## 2014-12-18 DIAGNOSIS — M1990 Unspecified osteoarthritis, unspecified site: Secondary | ICD-10-CM | POA: Diagnosis not present

## 2014-12-18 DIAGNOSIS — Z7952 Long term (current) use of systemic steroids: Secondary | ICD-10-CM | POA: Insufficient documentation

## 2014-12-18 DIAGNOSIS — F419 Anxiety disorder, unspecified: Secondary | ICD-10-CM | POA: Diagnosis not present

## 2014-12-18 DIAGNOSIS — Z79899 Other long term (current) drug therapy: Secondary | ICD-10-CM | POA: Diagnosis not present

## 2014-12-18 DIAGNOSIS — R251 Tremor, unspecified: Secondary | ICD-10-CM | POA: Insufficient documentation

## 2014-12-18 DIAGNOSIS — Z139 Encounter for screening, unspecified: Secondary | ICD-10-CM

## 2014-12-18 NOTE — Telephone Encounter (Signed)
Discussed use of Zofran and patient conserns for seizure risk with her neurologist:  "Zofran as needed should be a ok choice for her. I looked into Micromedex,   Tonic-clonic seizure  a) A case report described a patient who experienced tonic-clonic movements, frothing at the mouth, and unresponsiveness shortly after an IV infusion of 10 mg of ondansetron. The patient was not rechallenged, and no further seizures occurred   I have nerve seen a zofran induced seizure in my daily practice. "  Please notify the patient that use of Zofran should be ok. Call with any questions.

## 2014-12-18 NOTE — Progress Notes (Signed)
cc'ed to pcp °

## 2014-12-18 NOTE — ED Notes (Signed)
Patent was rapid response in hospital in EEG department. Patient was having EEG done was giving strobe light stimulation and patient had seizure like activity x 5 minutes. No post-ictal state per ICU RN, pt was immediate alert/oriented x 4 after activity.

## 2014-12-18 NOTE — Discharge Instructions (Signed)
°Emergency Department Resource Guide °1) Find a Doctor and Pay Out of Pocket °Although you won't have to find out who is covered by your insurance plan, it is a good idea to ask around and get recommendations. You will then need to call the office and see if the doctor you have chosen will accept you as a new patient and what types of options they offer for patients who are self-pay. Some doctors offer discounts or will set up payment plans for their patients who do not have insurance, but you will need to ask so you aren't surprised when you get to your appointment. ° °2) Contact Your Local Health Department °Not all health departments have doctors that can see patients for sick visits, but many do, so it is worth a call to see if yours does. If you don't know where your local health department is, you can check in your phone book. The CDC also has a tool to help you locate your state's health department, and many state websites also have listings of all of their local health departments. ° °3) Find a Walk-in Clinic °If your illness is not likely to be very severe or complicated, you may want to try a walk in clinic. These are popping up all over the country in pharmacies, drugstores, and shopping centers. They're usually staffed by nurse practitioners or physician assistants that have been trained to treat common illnesses and complaints. They're usually fairly quick and inexpensive. However, if you have serious medical issues or chronic medical problems, these are probably not your best option. ° °No Primary Care Doctor: °- Call [facility] at  832-8000 - they can help you locate a primary care doctor that  accepts your insurance, provides certain services, etc. °- Physician Referral Service- 1-800-533-3463 ° °Chronic Pain Problems: °Organization         Address  Phone   Notes  °[facility]  (336) 297-2271 Patients need to be referred by their primary care doctor.  ° °Medication  Assistance: °Organization         Address  Phone   Notes  °[place] [facility] [address] °[place], [place] [address] (336) 641-8030 --Must be a resident of [place] °-- Must have NO insurance coverage whatsoever (no Medicaid/ Medicare, etc.) °-- The pt. MUST have a primary care doctor that directs their care regularly and follows them in the community °  °[facility]  (866) 331-1348   °[employer]  (888) 892-1162   ° °Agencies that provide inexpensive medical care: °Organization         Address  Phone   Notes  °[facility]  (336) 832-8035   °[facility]    (336) 832-7272   °[facility] [address] °[place], [place] [address] (336) 832-4777   °[facility] [address], °[place] (336) 271-4999   °[facility]    (336) 373-0678   °[facility]    (336) 272-1050   °[facility] ° [address], [place] Phone:  (336) 832-4444, Fax:  (336) 832-4440 Hours of Operation:  9 am - 6 pm, M-F.  Also accepts Medicaid/Medicare and self-pay.  °[facility] ° [address], [place] Phone: (336) 832-3150, Fax: (336) 832-3151. Hours of Operation:  8:30 am - 5:30 pm, M-F.  Also accepts Medicaid and self-pay.  °[facility] [place] [address], [place] Phone: (336) 878-6027   °[facility] [address], [place], [place] (336)723-1848, Ext. 123 Mondays & Thursdays: 7-9 AM.  First 15 patients are seen on a first come, first serve basis. °  ° °Medicaid-accepting [place] Providers: ° °Organization         Address  Phone   Notes  °[facility] [address], [place] (336) 641-2100 Also accepts self-pay patients.  °[facility] [address], [place] ° (336) 856-9996   °[facility] [address], [place]  (336) 288-8857   °[facility] [address] [place] [address], [place] (336) 299-7000   °Veita Bland [address], [place]  ° (336) 373-1557 Only accepts [place] Access Medicaid patients after they have their name applied to their card.  ° °Self-Pay (no insurance) in [place]: ° °Organization         Address  Phone   Notes  °Sickle Cell Patients, [facility] [address], [place] (336) 832-1970   °[facility] [address], [place] (336) 832-4400   °[facility] ° [address], [place] (336) 992-4800   °[facility]/Dr. Osei-Bonsu ° [address] [place] [address], [place] or [address], [place] (336) 841-8500 Phone number for both [place] and [place] locations is the same.  °[facility] [address], [place] (336) 299-0000   °[facility] [address] [place] [address], [place] or [address] (336) 852-7530 °(336) 878-2260   °[facility] [address], [place] (336) 350-1642, phone; (336) 294-5005, fax Sees patients 1st and 3rd Saturday of every month.  Must not qualify for public or private insurance (i.e. Medicaid, Medicare, [place] Health Choice, Veterans' Benefits) • Household income should be no more than 200% of the poverty level •The clinic cannot treat you if you are pregnant or think you are pregnant • Sexually transmitted diseases are not treated at the clinic.  ° ° °Dental Care: °Organization         Address  Phone  Notes  °[place] [facility] [address], [place] (336) 641-6152 Accepts children up to age 21 who are enrolled in Medicaid or [place] Health Choice; pregnant women with a Medicaid card; and children who have applied for Medicaid or [place] Health Choice, but were declined, whose parents can pay a reduced fee at time of service.  °[place]  [facility] [place]  [address], [place] (336) 641-7733 Accepts children up to age 21 who are enrolled in Medicaid or [place] Health Choice; pregnant women with a Medicaid card; and children who have applied for Medicaid or [place] Health Choice, but were declined, whose parents can pay a reduced fee at time of service.  °[facility] ° [address], [place] (336) 641-4533 Patients are seen by appointment only. Walk-ins are not accepted. [facility] will see patients 18 years of age and older. °Monday - Tuesday (8am-5pm) °Most Wednesdays (8:30-5pm) °$30 per visit, cash only  °[facility] ° [address], [place] (336) 641-4533 Patients are seen by appointment only. Walk-ins are not accepted. [facility] will see patients 18 years of age and older. °One   Wednesday Evening (Monthly: Volunteer Based).  $30 per visit, cash only  °[facility]  (919) 537-3737 for adults; Children under age 4, call Graduate Pediatric Dentistry at ([phone]) [phone]. Children aged 4-14, please call (919) [phone] to request a pediatric application. ° Dental services are provided in all areas of dental care including fillings, crowns and bridges, complete and partial dentures, implants, gum treatment, root canals, and extractions. Preventive care is also provided. Treatment is provided to both adults and children. °Patients are selected via a lottery and there is often a waiting list. °  °[facility] [address], °[place] ° (336) 763-8833 [email or web address] °  °[facility] [address], [place], [place] (336)723-1848, Ext. 123 Second and Fourth Thursday of each month, opens at 6:30 AM; Clinic ends at 9 AM.  Patients are seen on a first-come first-served basis, and a limited number are seen during each clinic.  ° °[facility] ° [address], [place], [place] (336) 723-7904    Eligibility Requirements °You must have lived in [place], [place], or [place] counties for at least the last three months. °  You cannot be eligible for state or federal sponsored healthcare insurance, including Veterans Administration, Medicaid, or Medicare. °  You generally cannot be eligible for healthcare insurance through your employer.  °  How to apply: °Eligibility screenings are held every Tuesday and Wednesday afternoon from 1:00 pm until 4:00 pm. You do not need an appointment for the interview!  °[facility] [address], [place], [place] 336-631-2330   °[facility]  336-342-8273   °[facility]  336-703-3100   °[facility]  336-570-6415   ° °Behavioral Health Resources in the Community: °Intensive Outpatient Programs °Organization         Address  Phone  Notes  °[facility] [address], [place], [place] 336-878-6098   °[facility] [address], [place], [place] 336-832-9800   °[facility] [address], [place], [place] ° 336-882-2125   °[facility] [address],  °[place], [place] 1-800-853-5163 or 336-641-4981   °Substance Abuse Resources °Organization         Address  Phone  Notes  °[facility]  336-882-2125   °[facility]  336-784-9470   °[facility]  336-285-9073   °[facility]  336-845-3988   °Residential & Outpatient Substance Abuse Program  1-800-659-3381   °Psychological Services °Organization         Address  Phone  Notes  °[facility]  336- 832-9600   °[facility]  336- 378-7881   °[facility] [address], [place] 1-800-853-5163 or 336-641-4981   ° °Mobile Crisis Teams °Organization         Address  Phone  Notes  °[facility], Mobile Crisis Care Unit  1-877-626-1772   °Assertive °[facility] ° [address]  [place], [place] 336-834-9664   °Sharon DeEsch [address] °[place] 336-554-5454   ° °Self-Help/Support Groups °Organization         Address  Phone             Notes  °[facility]  - variety of support groups  336- 373-1402 Call for more information  °Narcotics Anonymous (NA), [facility] [address], °[place]  2 meetings at this location  ° °  Residential Treatment Programs Organization         Address  Phone  Notes  [facility] [address],    [place]  [phone]   [facility]  [address], [place] [phone], [place], [place] [phone]   [facility] [address], [place] [phone] Admissions: 8am-3pm M-F  [facility] [address]    [place], [place] [phone]   [facility] [address], [place], [place] [phone]   [facility] [address]  [place], [place] [phone]   [facility] Intensive Outpatient [address], [place], [place] [phone]   [facility] ([facility] [address]  [place], [place] [phone] or [phone]   [facility] ([facility]) [address] [place], [place] [phone] Accepts Medicaid  [facility] [address]  [place] [phone] Substance Abuse/Addiction Treatment   [facility]         Address  Phone  Notes  [facility]  612-640-1411(888) 651-361-2677   Angie FavaJulie Brannon, PhD [address], [address] [place], [place]   [phone](336) [phone] or 3602724000(336) 602-531-1299   [facility]   [address] [place], [place] [phone](336) [phone]   [facility] [address], [place], [place] 726-485-8735(336) [phone] Insurance/Medicaid/sponsorship through [facility]  [facility] [address]                                    [place], [place] [phone](336) [phone] Therapy/tele-psych/case    [facility] [address]   [place], [place] [phone](336) [phone]    Dr. Lolly MustacheArfeen  ([phone]) [phone](336) [phone]   [facility]  [employer] [facility] 1) [address], [place] 2) [address], [place] 3)  [address] [place] [address], [place] ([phone])[phone](336) ([phone](336) [phone]  ([phone](336) ([phone]   [facility] Child [facility] 201-697-9452(336) 902-306-0012 or (334) 197-2190(336) 5028742911 (After Hours)      Take your usual prescriptions as previously directed.  Call your regular Neurologist today to schedule a follow up appointment within the next week.  Return to the Emergency Department immediately sooner if worsening.

## 2014-12-18 NOTE — Progress Notes (Addendum)
Patient came for EEG, patient arrived anxious, during electrode placement patient was talking about trying to get disability and that her job was wanting her to quit and she had to have a paper by the end of this week stating she WAS NOT medically able to work and she didn't think she was going to get it. During the electrode placement the  procedure was explained and patient was told that photic stimulation could cause a seizure. She stated she already knew that. Tech waited to the end of recording to perform photic. Patient being with a jerk as soon as the strobe light went off. During the last two rounds of photic patient arched her back yelled loudly and then began a bicycle movement with her legs, then she went into the fetal position and then started rolling to roll out of bed.on both sides. Corena HerterElizabeth Edwards, RN came in to help as well as two respiratory techs. Rapid response was called. Patient had no post ictal period and was able to answer questions after episode. Patient was taken by myself and Rapid response nurse to the ED.

## 2014-12-18 NOTE — Telephone Encounter (Signed)
Patient called stating that she is going be late, around 3PM for her MRI today due to her having a seizure while having her EEG @ [facility].  Sela HuaLenny is aware of this and we will see the patient once she gets here.

## 2014-12-18 NOTE — Progress Notes (Signed)
**Note De-Identified Bethany Barton Obfuscation** RRT responded to a rapid response call.

## 2014-12-18 NOTE — ED Notes (Signed)
MD at bedside. 

## 2014-12-18 NOTE — Telephone Encounter (Signed)
Called pt and LMOM.  

## 2014-12-18 NOTE — ED Provider Notes (Signed)
CSN: [id number]     Arrival date & time 12/18/14  1334 History   First MD Initiated Contact with Patient 12/18/14 1352     Chief Complaint  Patient presents with  . Seizures      HPI Pt was seen at 1400. Per EEG staff, pt and previous chart, c/o pt with sudden onset and resolution of one "shaking episode" that occurred PTA. Pt was in EEG dept to obtain outpatient EEG, and during the EEG set up was told the photic stimulation could cause a seizure. Pt then began the EEG. Towards the end of the EEG when the strobe light went off, pt began to have a "shaking episode." (See note below.) Pt was not incont of bowel/bladder, there was no post-ictal period, and she was able to answer questions directly after this episode. Pt was then brought to the ED for evaluation. Pt currently denies any complaints.   Past Medical History  Diagnosis Date  . Hypertension   . Peripheral neuropathy (HCC)   . Facial tic   . Diabetes mellitus without complication (HCC)   . Polysubstance abuse     etoh, opiates  . Migraines   . Depression   . Anxiety   . Osteoarthritis   . Neuropathic pain of both feet (HCC)     chronic  . Cellulitis     Right foot  . Seizure (HCC)   . Ileus [facility](HCC)     hospitalized in November 2016   Past Surgical History  Procedure Laterality Date  . 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 Abdominal hysterectomy  2013  . Ankle fusion Right 1991  . Appendectomy  1987  . Cesarean section  1997  . Abdominoplasty  2013  . Cholecystectomy    . Incision and drainage abscess N/A 09/25/2013    Procedure: INCISION AND DRAINAGE ABSCESS;  Surgeon: Cherylynn RidgesJames O [name], MD;  Location: [facility] OR;  Service: General;  Laterality: N/A;  . Cholecystectomy    . Esophagogastroduodenoscopy (egd) with propofol N/A 11/03/2014    NUR:  . Esophageal biopsy N/A 11/03/2014    Procedure: BIOPSY;  Surgeon: Malissa HippoNajeeb U Rehman, MD;  Location: [facility];  Service: Endoscopy;  Laterality: N/A;   Family History   Problem Relation Age of Onset  . Bipolar disorder Sister   . Drug abuse Sister   . Schizophrenia Son   . Bipolar disorder Father   . Alcohol abuse Father   . Drug abuse Mother   . Alcohol abuse Mother   . Colon cancer Neg Hx    Social History  Substance Use Topics  . Smoking status: Never Smoker   . Smokeless tobacco: Never Used  . Alcohol Use: No     Comment: Denies alcohol since June 2015.    Review of Systems ROS: Statement: All systems negative except as marked or noted in the HPI; Constitutional: Negative for fever and chills. ; ; Eyes: Negative for eye pain, redness and discharge. ; ; ENMT: Negative for ear pain, hoarseness, nasal congestion, sinus pressure and sore throat. ; ; Cardiovascular: Negative for chest pain, palpitations, diaphoresis, dyspnea and peripheral edema. ; ; Respiratory: Negative for cough, wheezing and stridor. ; ; Gastrointestinal: Negative for nausea, vomiting, diarrhea, abdominal pain, blood in stool, hematemesis, jaundice and rectal bleeding. . ; ; Genitourinary: Negative for dysuria, flank pain and hematuria. ; ; Musculoskeletal: Negative for back pain and neck pain. Negative for swelling and trauma.; ; Skin: Negative for pruritus,  rash, abrasions, blisters, bruising and skin lesion.; ; Neuro: +"shaking episode." Negative for headache, lightheadedness and neck stiffness. Negative for weakness, altered level of consciousness , altered mental status, extremity weakness, paresthesias, and syncope.      Allergies  Doxycycline; Keflex; Iodine; Lyrica; Neomycin; Nsaids; Phenergan; Sulfa antibiotics; and Hibiclens  Home Medications   Prior to Admission medications   Medication Sig Start Date End Date Taking? Authorizing Provider  aspirin-acetaminophen-caffeine (EXCEDRIN MIGRAINE) [phone] MG tablet Take 1 tablet by mouth every 6 (six) hours as needed for headache.    Historical Provider, MD  clonazePAM (KLONOPIN) 0.5 MG tablet Take 0.25 mg by mouth 2 (two)  times daily.  09/13/14   Historical Provider, MD  cloNIDine (CATAPRES) 0.2 MG tablet Take 1 tablet (0.2 mg total) by mouth 2 (two) times daily. Patient taking differently: Take 0.2 mg by mouth 4 (four) times daily.  11/03/14   Standley Brooking, MD  DULoxetine (CYMBALTA) 60 MG capsule TAKE (1) CAPSULE BY MOUTH ONCE DAILY. 12/08/14   Levert Feinstein, MD  L-Methylfolate-B6-B12 (FOLTANX) 3-35-2 MG TABS Take 1 tablet by mouth 2 (two) times daily. 10/16/14   Historical Provider, MD  Multiple Vitamin (MULTIVITAMIN WITH MINERALS) TABS tablet Take 1 tablet by mouth daily.    Historical Provider, MD  NUVIGIL 200 MG TABS TAKE ONE TABLET BY MOUTH ONCE DAILY. 11/28/14   Levert Feinstein, MD  ondansetron (ZOFRAN) 8 MG tablet Take 1 tablet (8 mg total) by mouth every 8 (eight) hours as needed for nausea or vomiting. 10/26/14   Henderson Cloud, MD  Oxycodone HCl 10 MG TABS Take one tablet every four hours as needed for pain. 10/16/14   Historical Provider, MD  predniSONE (DELTASONE) 10 MG tablet Take 1 tablet (10 mg total) by mouth daily with breakfast. 12/06/14   Vanetta Mulders, MD  RELPAX 40 MG tablet TAKE ONE TABLET AS NEEDED FOR HEADACHE. MAY REPEAT IN 2 HOURS IF NEEDED. 12/10/14   Levert Feinstein, MD  SUMAtriptan (IMITREX) 6 MG/0.5ML SOLN injection Inject 0.5 mLs (6 mg total) into the skin every 2 (two) hours as needed for migraine or headache. May repeat in 2 hours if headache persists or recurs. 11/19/14   Levert Feinstein, MD  topiramate (TOPAMAX) 100 MG tablet Take 1 tablet (100 mg total) by mouth 2 (two) times daily. 11/19/14   Levert Feinstein, MD  topiramate (TOPAMAX) 25 MG tablet One po bid xone week, then 2 tabs po bid, then 3 tabs po bid Patient taking differently: Take 50 mg by mouth 4 (four) times daily. One po bid xone week, then 2 tabs po bid, then 3 tabs po bid 11/19/14   Levert Feinstein, MD  VITAMIN D, CHOLECALCIFEROL, PO Take 2,000 Units by mouth daily.    Historical Provider, MD   BP 148/94 mmHg  Pulse 85  Temp(Src) 97.9 F  (36.6 C) (Oral)  Resp 21  SpO2 100% Physical Exam  1405: Physical examination:  Nursing notes reviewed; Vital signs and O2 SAT reviewed;  Constitutional: Well developed, Well nourished, Well hydrated, In no acute distress; Head:  Normocephalic, atraumatic; Eyes: EOMI, PERRL, No scleral icterus; ENMT: Mouth and pharynx normal, Mucous membranes moist; Neck: Supple, Full range of motion, No lymphadenopathy; Cardiovascular: Regular rate and rhythm, No murmur, rub, or gallop; Respiratory: Breath sounds clear & equal bilaterally, No rales, rhonchi, wheezes.  Speaking full sentences with ease, Normal respiratory effort/excursion; Chest: Nontender, Movement normal; Abdomen: Soft, Nontender, Nondistended, Normal bowel sounds; Genitourinary: No CVA tenderness; Extremities: Pulses normal,  No tenderness, No edema, No calf edema or asymmetry.; Neuro: AA&Ox3, Major CN grossly intact. No facial droop. Speech clear. No gross focal motor or sensory deficits in extremities.; Skin: Color normal, Warm, Dry.   ED Course  Procedures (including critical care time) Labs Review   Imaging Review  I have personally reviewed and evaluated these images and lab results as part of my medical decision-making.   EKG Interpretation None      MDM  MDM Reviewed: previous chart, nursing note and vitals    EEG dept note from PTA:     Progress Notes      Varney Baas at 12/18/2014 1:06 PM     Status: Addendum       Expand All Collapse All   Patient came for EEG, patient arrived anxious, during electrode placement patient was talking about trying to get disability and that her job was wanting her to quit and she had to have a paper by the end of this week stating she WAS NOT medically able to work and she didn't think she was going to get it. During the electrode placement the procedure was explained and patient was told that photic stimulation could cause a seizure. She stated she already new that. Tech  waited to the end of recording to perform photic. Patient being with a jerk as soon as the strobe light went off. During the last two rounds of photic patient arched her back yelled loudly and then began a bicycle movement with her legs, then she went into the fetal position and then started rolling to roll out of bed.on both sides. Corena Herter, RN came in to help as well as two respiratory techs. Rapid response was called. Patient had no post ictal period and was able to answer questions after episode. Patient was taken by myself and Rapid response nurse to the ED.         1420:  T/C to Neuro Dr. Terrace Arabia, case discussed, including:  HPI, pertinent PM/SHx, VS/PE, dx testing, ED course and treatment:  Agrees this episode above does not sound like a true seizure, pt is due also for MRI today; pt can be d/c from the ED and encouraged to keep her f/u appt on 01/09/15 in the office. Dx and d/w Neuro MD, d/w pt and family.  Questions answered.  Verb understanding, agreeable to d/c home with outpt f/u.     Samuel Jester, DO 12/21/14 0002

## 2014-12-20 NOTE — Procedures (Signed)
[facility] Zyann Mabry A. Gerilyn Pilgrim, MD     [email or web address]           HISTORY: The patient is a 44 year old female who presents with recurrent spells of shaking suspicious for Epilepsy.   MEDICATIONS: Scheduled Meds: Continuous Infusions: PRN Meds:.  Prior to Admission medications   Medication Sig Start Date End Date Taking? Authorizing Provider  aspirin-acetaminophen-caffeine (EXCEDRIN MIGRAINE) [phone] MG tablet Take 1 tablet by mouth every 6 (six) hours as needed for headache.    Historical Provider, MD  clonazePAM (KLONOPIN) 0.5 MG tablet Take 0.25 mg by mouth 2 (two) times daily.  09/13/14   Historical Provider, MD  cloNIDine (CATAPRES) 0.2 MG tablet Take 1 tablet (0.2 mg total) by mouth 2 (two) times daily. Patient taking differently: Take 0.2 mg by mouth 4 (four) times daily.  11/03/14   Standley Brooking, MD  DULoxetine (CYMBALTA) 60 MG capsule TAKE (1) CAPSULE BY MOUTH ONCE DAILY. 12/08/14   Levert Feinstein, MD  L-Methylfolate-B6-B12 (FOLTANX) 3-35-2 MG TABS Take 1 tablet by mouth 2 (two) times daily. 10/16/14   Historical Provider, MD  Multiple Vitamin (MULTIVITAMIN WITH MINERALS) TABS tablet Take 1 tablet by mouth daily.    Historical Provider, MD  NUVIGIL 200 MG TABS TAKE ONE TABLET BY MOUTH ONCE DAILY. 11/28/14   Levert Feinstein, MD  ondansetron (ZOFRAN) 8 MG tablet Take 1 tablet (8 mg total) by mouth every 8 (eight) hours as needed for nausea or vomiting. 10/26/14   Henderson Cloud, MD  Oxycodone HCl 10 MG TABS Take one tablet every four hours as needed for pain. 10/16/14   Historical Provider, MD  predniSONE (DELTASONE) 10 MG tablet Take 1 tablet (10 mg total) by mouth daily with breakfast. 12/06/14   Vanetta Mulders, MD  RELPAX 40 MG tablet TAKE ONE TABLET AS NEEDED FOR HEADACHE. MAY REPEAT IN 2 HOURS IF NEEDED. 12/10/14   Levert Feinstein, MD  SUMAtriptan (IMITREX) 6 MG/0.5ML SOLN injection Inject 0.5 mLs (6 mg total) into the skin every 2 (two) hours as needed for migraine  or headache. May repeat in 2 hours if headache persists or recurs. 11/19/14   Levert Feinstein, MD  topiramate (TOPAMAX) 100 MG tablet Take 1 tablet (100 mg total) by mouth 2 (two) times daily. 11/19/14   Levert Feinstein, MD  topiramate (TOPAMAX) 25 MG tablet One po bid xone week, then 2 tabs po bid, then 3 tabs po bid Patient taking differently: Take 50 mg by mouth 4 (four) times daily. One po bid xone week, then 2 tabs po bid, then 3 tabs po bid 11/19/14   Levert Feinstein, MD  VITAMIN D, CHOLECALCIFEROL, PO Take 2,000 Units by mouth daily.    Historical Provider, MD      ANALYSIS: A 16 channel recording using standard 10 20 measurements is conducted for 30 minutes. There is a well formed posterior dominant rhythm of  13.5-14 Hz which attenuates with opening. There is beta activity observed in the frontal areas. Awake and drowsy activities are observed. There is no focal or lateralized slowing observed throughout the recording. Photic stimulation is carried out with the patient having a spell of movements associated with her back being arched backwards. The technician reports that she was had scissoring like movements with his spells and her eyes were closed. There is no significant postictal lethargy. The patient went into a fetal position afterwards. There are no electrographic correlates with this event. No postictal slowing on the electrographic recording.There are no epileptiform activities  observed.    IMPRESSION:  This recording is essentially unrevealing. The event occurred throughout the recording shows no electrographic correlates and is not due to epileptic seizures.       Eudell Julian A. Gerilyn Pilgrimoonquah, M.D.  Diplomate, Biomedical engineerAmerican Board of Psychiatry and Neurology ( Neurology).

## 2014-12-20 NOTE — Telephone Encounter (Signed)
Called pt Bethany Barton to call back. 

## 2014-12-23 ENCOUNTER — Telehealth: Payer: Self-pay | Admitting: *Deleted

## 2014-12-23 NOTE — Telephone Encounter (Signed)
Mailed letter °

## 2014-12-23 NOTE — Telephone Encounter (Signed)
Left patient a message to call

## 2014-12-23 NOTE — Telephone Encounter (Signed)
-----   Message from Levert FeinsteinYijun Yan, MD sent at 12/21/2014  1:50 PM EDT ----- Please call pt for normal MRI brain.

## 2014-12-23 NOTE — Telephone Encounter (Signed)
Left message, as previously approved by patient, on her voicemail letting her know her results are normal.

## 2014-12-25 ENCOUNTER — Ambulatory Visit (INDEPENDENT_AMBULATORY_CARE_PROVIDER_SITE_OTHER): Payer: 59 | Admitting: Neurology

## 2014-12-25 ENCOUNTER — Telehealth: Payer: Self-pay

## 2014-12-25 ENCOUNTER — Encounter: Payer: Self-pay | Admitting: Neurology

## 2014-12-25 VITALS — BP 115/84 | HR 81 | Ht 63.0 in | Wt 167.0 lb

## 2014-12-25 DIAGNOSIS — E1140 Type 2 diabetes mellitus with diabetic neuropathy, unspecified: Secondary | ICD-10-CM

## 2014-12-25 DIAGNOSIS — E1165 Type 2 diabetes mellitus with hyperglycemia: Secondary | ICD-10-CM | POA: Diagnosis not present

## 2014-12-25 DIAGNOSIS — G43709 Chronic migraine without aura, not intractable, without status migrainosus: Secondary | ICD-10-CM

## 2014-12-25 DIAGNOSIS — R569 Unspecified convulsions: Secondary | ICD-10-CM

## 2014-12-25 DIAGNOSIS — IMO0002 Reserved for concepts with insufficient information to code with codable children: Secondary | ICD-10-CM

## 2014-12-25 MED ORDER — TOPIRAMATE ER 200 MG PO CAP24: 200.0000 mg | ORAL_CAPSULE | Freq: Every day | ORAL | Status: DC
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
## 2014-12-25 NOTE — Telephone Encounter (Signed)
[employer] [facility]([employer]) has approved the request for coverage on Trokendi effective until 12/26/2014 Ref # [id number]

## 2014-12-26 NOTE — Telephone Encounter (Signed)
Trokendi xr is approved.

## 2014-12-30 ENCOUNTER — Other Ambulatory Visit: Payer: Self-pay

## 2014-12-31 MED ORDER — ONDANSETRON HCL 8 MG PO TABS: 8.0000 mg | ORAL_TABLET | Freq: Three times a day (TID) | ORAL | Status: DC | PRN

## 2015-01-01 ENCOUNTER — Ambulatory Visit (HOSPITAL_COMMUNITY)
Admission: RE | Admit: 2015-01-01 | Discharge: 2015-01-01 | Disposition: A | Discharge status: Home / Self Care | Payer: 59 | Source: Ambulatory Visit | Attending: Internal Medicine | Admitting: Internal Medicine

## 2015-01-01 ENCOUNTER — Other Ambulatory Visit: Payer: Self-pay | Admitting: Urology

## 2015-01-01 ENCOUNTER — Ambulatory Visit (INDEPENDENT_AMBULATORY_CARE_PROVIDER_SITE_OTHER): Payer: 59 | Admitting: Urology

## 2015-01-01 DIAGNOSIS — R8299 Other abnormal findings in urine: Secondary | ICD-10-CM

## 2015-01-01 DIAGNOSIS — Z1231 Encounter for screening mammogram for malignant neoplasm of breast: Secondary | ICD-10-CM | POA: Diagnosis not present

## 2015-01-01 DIAGNOSIS — R102 Pelvic and perineal pain: Secondary | ICD-10-CM

## 2015-01-01 DIAGNOSIS — R3129 Other microscopic hematuria: Secondary | ICD-10-CM

## 2015-01-03 ENCOUNTER — Ambulatory Visit (HOSPITAL_COMMUNITY)
Admission: RE | Admit: 2015-01-03 | Discharge: 2015-01-03 | Disposition: A | Discharge status: Home / Self Care | Payer: 59 | Source: Ambulatory Visit | Attending: Urology | Admitting: Urology

## 2015-01-03 DIAGNOSIS — R3129 Other microscopic hematuria: Secondary | ICD-10-CM | POA: Insufficient documentation

## 2015-01-06 ENCOUNTER — Telehealth: Payer: Self-pay | Admitting: Neurology

## 2015-01-06 ENCOUNTER — Encounter: Payer: Self-pay | Admitting: *Deleted

## 2015-01-06 NOTE — Telephone Encounter (Signed)
Spoke to [place] - she is going to pick up the letter from our office.  Placed up front for pick up.

## 2015-01-06 NOTE — Telephone Encounter (Signed)
Patient called back to request that letter be faxed to: Attention Dorene SorrowJerry [phone] and patient will also pick up letter.

## 2015-01-06 NOTE — Telephone Encounter (Signed)
Letter is done, please send to patient.

## 2015-01-06 NOTE — Telephone Encounter (Signed)
Patient is calling to request letter for work, stating that she can't drive for the 6 months from seizure but that she can work with only restriction of being unable to drive. Patient has to have letter before she can return to work. Patient would like for us to scan letter and send to secure email address: [email or web address]  Please call [phone].

## 2015-01-06 NOTE — Telephone Encounter (Signed)
Letter faxed and confirmed to requested number.

## 2015-01-08 ENCOUNTER — Ambulatory Visit (INDEPENDENT_AMBULATORY_CARE_PROVIDER_SITE_OTHER): Payer: 59 | Admitting: Urology

## 2015-01-08 DIAGNOSIS — N760 Acute vaginitis: Secondary | ICD-10-CM

## 2015-01-08 DIAGNOSIS — R3129 Other microscopic hematuria: Secondary | ICD-10-CM | POA: Diagnosis not present

## 2015-01-08 DIAGNOSIS — R102 Pelvic and perineal pain: Secondary | ICD-10-CM

## 2015-01-08 DIAGNOSIS — R8299 Other abnormal findings in urine: Secondary | ICD-10-CM

## 2015-01-08 NOTE — Telephone Encounter (Signed)
Patient called to advise note needs to say that the only limitation is that she can't drive from 6 months of the date of seizure

## 2015-01-08 NOTE — Telephone Encounter (Signed)
Letter is done, no driving until seizure free for 6 months

## 2015-01-08 NOTE — Telephone Encounter (Signed)
Patient also requests that note be faxed to her at [phone]

## 2015-01-09 ENCOUNTER — Encounter: Payer: Self-pay | Admitting: *Deleted

## 2015-01-09 ENCOUNTER — Ambulatory Visit: Payer: Self-pay | Admitting: Neurology

## 2015-01-09 NOTE — Telephone Encounter (Signed)
Letter is updated - faxed to requested number and mailed to her home address.

## 2015-01-21 ENCOUNTER — Ambulatory Visit: Payer: 59 | Admitting: Neurology

## 2015-01-22 ENCOUNTER — Ambulatory Visit: Payer: Self-pay | Admitting: Urology

## 2015-01-29 ENCOUNTER — Other Ambulatory Visit: Payer: Self-pay | Admitting: Neurology

## 2015-02-04 ENCOUNTER — Encounter: Payer: Self-pay | Admitting: Internal Medicine

## 2015-02-05 ENCOUNTER — Encounter: Payer: Self-pay | Admitting: Internal Medicine

## 2015-02-05 ENCOUNTER — Ambulatory Visit (INDEPENDENT_AMBULATORY_CARE_PROVIDER_SITE_OTHER): Payer: 59 | Admitting: Internal Medicine

## 2015-02-05 ENCOUNTER — Ambulatory Visit: Payer: Self-pay | Admitting: Urology

## 2015-02-05 ENCOUNTER — Other Ambulatory Visit (INDEPENDENT_AMBULATORY_CARE_PROVIDER_SITE_OTHER): Payer: 59 | Admitting: *Deleted

## 2015-02-05 VITALS — BP 118/64 | HR 110 | Temp 98.7°F | Resp 12 | Ht 64.0 in | Wt 164.2 lb

## 2015-02-05 DIAGNOSIS — E1165 Type 2 diabetes mellitus with hyperglycemia: Secondary | ICD-10-CM

## 2015-02-05 DIAGNOSIS — E2740 Unspecified adrenocortical insufficiency: Secondary | ICD-10-CM | POA: Diagnosis not present

## 2015-02-05 DIAGNOSIS — E1140 Type 2 diabetes mellitus with diabetic neuropathy, unspecified: Secondary | ICD-10-CM | POA: Diagnosis not present

## 2015-02-05 DIAGNOSIS — IMO0002 Reserved for concepts with insufficient information to code with codable children: Secondary | ICD-10-CM

## 2015-02-05 LAB — T4, FREE: FREE T4: 0.45 ng/dL — AB (ref 0.60–1.60)

## 2015-02-05 LAB — T3, FREE: T3 FREE: 3.2 pg/mL (ref 2.3–4.2)

## 2015-02-05 LAB — POCT GLYCOSYLATED HEMOGLOBIN (HGB A1C): Hemoglobin A1C: 5.6

## 2015-02-05 LAB — TSH: TSH: 0.67 u[IU]/mL (ref 0.35–4.50)

## 2015-02-05 MED ORDER — HYDROCORTISONE 10 MG PO TABS: 10.0000 mg | ORAL_TABLET | Freq: Every day | ORAL | Status: DC

## 2015-02-05 NOTE — Patient Instructions (Addendum)
Please stop at the lab.  Please stop Prednisone and start hydrocortisone 10 mg in a.m. and 10 mg in p.m. (no later than 6 PM). In 1 month, decrease the pm dose to 5 mg.  - You absolutely need to take this medication every day and not skip doses. - Please double the dose if you have a fever, for the duration of the fever. - If you cannot take anything by mouth (vomiting) or you have severe diarrhea so that you eliminate the hydrocortisone pills in your stool, please make sure that you get the hydrocortisone in the vein instead - go to the nearest emergency department/urgent care or you may go to your PCPs office  - Please try to get a MedAlert bracelet or pendant indicating: "Adrenal insufficiency". - Do not run out of hydrocortisone  Please return in 3 months.
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
## 2015-02-06 LAB — FOLLICLE STIMULATING HORMONE: FSH: 44.6 m[IU]/mL

## 2015-02-06 LAB — PROLACTIN: PROLACTIN: 1.6 ng/mL

## 2015-02-06 LAB — LUTEINIZING HORMONE: LH: 13.46 m[IU]/mL

## 2015-02-08 LAB — INSULIN-LIKE GROWTH FACTOR
IGF-I, LC/MS: 134 ng/mL (ref 52–328)
Z-Score (Female): -0.1 SD (ref ?–2.0)

## 2015-02-18 ENCOUNTER — Other Ambulatory Visit: Payer: Self-pay | Admitting: Gastroenterology

## 2015-02-19 ENCOUNTER — Ambulatory Visit: Payer: Self-pay | Admitting: Urology

## 2015-02-20 MED ORDER — ONDANSETRON HCL 8 MG PO TABS: 8.0000 mg | ORAL_TABLET | Freq: Three times a day (TID) | ORAL | Status: DC | PRN

## 2015-03-05 ENCOUNTER — Inpatient Hospital Stay: Admission: RE | Admit: 2015-03-05 | Payer: BLUE CROSS/BLUE SHIELD | Source: Ambulatory Visit

## 2015-03-07 ENCOUNTER — Other Ambulatory Visit: Payer: BLUE CROSS/BLUE SHIELD

## 2015-03-11 ENCOUNTER — Ambulatory Visit
Admission: RE | Admit: 2015-03-11 | Discharge: 2015-03-11 | Disposition: A | Discharge status: Home / Self Care | Payer: BLUE CROSS/BLUE SHIELD | Source: Ambulatory Visit | Attending: Internal Medicine | Admitting: Internal Medicine

## 2015-03-11 MED ORDER — GADOBENATE DIMEGLUMINE 529 MG/ML IV SOLN
8.0000 mL | Freq: Once | INTRAVENOUS | Status: AC | PRN
  Administered 2015-03-11: 8 mL via INTRAVENOUS

## 2015-03-13 ENCOUNTER — Other Ambulatory Visit: Payer: Self-pay | Admitting: Orthopedic Surgery

## 2015-03-19 ENCOUNTER — Telehealth: Payer: Self-pay | Admitting: Nurse Practitioner

## 2015-03-19 ENCOUNTER — Ambulatory Visit: Payer: 59 | Admitting: Nurse Practitioner

## 2015-03-19 NOTE — Telephone Encounter (Signed)
Pt was a no show

## 2015-03-24 NOTE — Telephone Encounter (Signed)
Noted  

## 2015-04-10 ENCOUNTER — Telehealth: Payer: Self-pay | Admitting: Neurology

## 2015-04-10 NOTE — Telephone Encounter (Signed)
Patient called to advise Dr. 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 Elvera Lennox is not familiar with this type of cyst and referred back to PCP, patient wants to run by Dr. Terrace Arabia, wonders if this is what caused or contributed to seizure and migraines, also thinks she may have had another seizure a couple of nights ago, bit the back of her tongue and was very tired the next day.

## 2015-04-10 NOTE — Telephone Encounter (Addendum)
Per Dr. Terrace Arabia, her migraines and seizures are not related to findings on scan - it would be recommended for her to get a repeat scan w/wo contrast in a year.  She is coming in next week to discuss seizure activity and will continue her medication until this appt.

## 2015-04-15 ENCOUNTER — Telehealth: Payer: Self-pay | Admitting: Internal Medicine

## 2015-04-15 MED ORDER — HYDROCORTISONE 10 MG PO TABS: 10.0000 mg | ORAL_TABLET | Freq: Two times a day (BID) | ORAL | Status: DC

## 2015-04-15 NOTE — Telephone Encounter (Signed)
[facility] note dated 04/14/15 5:37 pm Caller states she is needing a new rx fro her steriod med called into [facility]. This is not the pharmacy she usually uses. Her regular pharmacy did not give her the right amount fo the medicaiton, she only got 30 tablets and it should be 60. She is not currently out of her medication. Pt was instructed to call office in AM.

## 2015-04-15 NOTE — Telephone Encounter (Signed)
Not sure about the mix up. Last Rx was sent to [facility], not [facility] and was for qty 60. Resent rx with the correct sig (2 times daily), qty#60 with 5 refills.

## 2015-04-17 ENCOUNTER — Encounter: Payer: Self-pay | Admitting: Neurology

## 2015-04-17 ENCOUNTER — Ambulatory Visit (INDEPENDENT_AMBULATORY_CARE_PROVIDER_SITE_OTHER): Payer: BLUE CROSS/BLUE SHIELD | Admitting: Neurology

## 2015-04-17 VITALS — BP 119/83 | HR 90 | Ht 64.0 in | Wt 165.0 lb

## 2015-04-17 DIAGNOSIS — G6289 Other specified polyneuropathies: Secondary | ICD-10-CM | POA: Diagnosis not present

## 2015-04-17 DIAGNOSIS — R269 Unspecified abnormalities of gait and mobility: Secondary | ICD-10-CM | POA: Diagnosis not present

## 2015-04-17 MED ORDER — SUMATRIPTAN SUCCINATE 6 MG/0.5ML ~~LOC~~ SOAJ: 6.0000 mg | SUBCUTANEOUS | Status: DC | PRN

## 2015-04-17 MED ORDER — ELETRIPTAN HYDROBROMIDE 40 MG PO TABS: 40.0000 mg | ORAL_TABLET | ORAL | Status: DC | PRN
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
## 2015-04-19 ENCOUNTER — Other Ambulatory Visit: Payer: Self-pay | Admitting: Nurse Practitioner

## 2015-06-25 ENCOUNTER — Ambulatory Visit: Payer: 59 | Admitting: Neurology

## 2015-07-02 ENCOUNTER — Other Ambulatory Visit: Payer: Self-pay | Admitting: Nurse Practitioner

## 2015-07-02 ENCOUNTER — Other Ambulatory Visit: Payer: Self-pay | Admitting: Neurology

## 2015-07-26 ENCOUNTER — Observation Stay (HOSPITAL_COMMUNITY)
Admission: EM | Admit: 2015-07-26 | Discharge: 2015-07-27 | Disposition: A | Discharge status: Home / Self Care | Payer: BLUE CROSS/BLUE SHIELD | Attending: Internal Medicine | Admitting: Internal Medicine

## 2015-07-26 ENCOUNTER — Emergency Department (HOSPITAL_COMMUNITY): Payer: BLUE CROSS/BLUE SHIELD

## 2015-07-26 ENCOUNTER — Encounter (HOSPITAL_COMMUNITY): Payer: Self-pay

## 2015-07-26 DIAGNOSIS — F10129 Alcohol abuse with intoxication, unspecified: Secondary | ICD-10-CM | POA: Diagnosis not present

## 2015-07-26 DIAGNOSIS — F10929 Alcohol use, unspecified with intoxication, unspecified: Secondary | ICD-10-CM

## 2015-07-26 DIAGNOSIS — M1990 Unspecified osteoarthritis, unspecified site: Secondary | ICD-10-CM | POA: Diagnosis not present

## 2015-07-26 DIAGNOSIS — E119 Type 2 diabetes mellitus without complications: Secondary | ICD-10-CM | POA: Diagnosis not present

## 2015-07-26 DIAGNOSIS — T50902A Poisoning by unspecified drugs, medicaments and biological substances, intentional self-harm, initial encounter: Secondary | ICD-10-CM | POA: Insufficient documentation

## 2015-07-26 DIAGNOSIS — I10 Essential (primary) hypertension: Secondary | ICD-10-CM | POA: Diagnosis not present

## 2015-07-26 DIAGNOSIS — Z79899 Other long term (current) drug therapy: Secondary | ICD-10-CM | POA: Diagnosis not present

## 2015-07-26 DIAGNOSIS — T50901A Poisoning by unspecified drugs, medicaments and biological substances, accidental (unintentional), initial encounter: Secondary | ICD-10-CM | POA: Diagnosis present

## 2015-07-26 DIAGNOSIS — E2740 Unspecified adrenocortical insufficiency: Secondary | ICD-10-CM | POA: Diagnosis present

## 2015-07-26 DIAGNOSIS — F329 Major depressive disorder, single episode, unspecified: Secondary | ICD-10-CM | POA: Diagnosis not present

## 2015-07-26 DIAGNOSIS — R4182 Altered mental status, unspecified: Secondary | ICD-10-CM | POA: Diagnosis present

## 2015-07-26 DIAGNOSIS — F1120 Opioid dependence, uncomplicated: Secondary | ICD-10-CM | POA: Diagnosis present

## 2015-07-26 DIAGNOSIS — F1910 Other psychoactive substance abuse, uncomplicated: Secondary | ICD-10-CM | POA: Diagnosis not present

## 2015-07-26 HISTORY — DX: Unspecified adrenocortical insufficiency: E27.40

## 2015-07-26 HISTORY — DX: Other chronic pain: G89.29

## 2015-07-26 HISTORY — DX: Dorsalgia, unspecified: M54.9

## 2015-07-26 HISTORY — DX: Pain in unspecified foot: M79.673

## 2015-07-26 HISTORY — DX: Fibromyalgia: M79.7

## 2015-07-26 LAB — COMPREHENSIVE METABOLIC PANEL
ALBUMIN: 3 g/dL — AB (ref 3.5–5.0)
ALK PHOS: 85 U/L (ref 38–126)
ALT: 34 U/L (ref 14–54)
ANION GAP: 10 (ref 5–15)
AST: 38 U/L (ref 15–41)
BILIRUBIN TOTAL: 0.2 mg/dL — AB (ref 0.3–1.2)
BUN: 12 mg/dL (ref 6–20)
CALCIUM: 8.1 mg/dL — AB (ref 8.9–10.3)
CO2: 23 mmol/L (ref 22–32)
Chloride: 110 mmol/L (ref 101–111)
Creatinine, Ser: 0.53 mg/dL (ref 0.44–1.00)
GFR calc Af Amer: >60 mL/min (ref >60)
GLUCOSE: 96 mg/dL (ref 65–99)
Potassium: 3.9 mmol/L (ref 3.5–5.1)
Sodium: 143 mmol/L (ref 135–145)
TOTAL PROTEIN: 5.7 g/dL — AB (ref 6.5–8.1)

## 2015-07-26 LAB — CBC WITH DIFFERENTIAL/PLATELET
Basophils Absolute: 0 10*3/uL (ref 0.0–0.1)
Basophils Relative: 1 %
EOS PCT: 2 %
Eosinophils Absolute: 0.2 10*3/uL (ref 0.0–0.7)
HEMATOCRIT: 39.3 % (ref 36.0–46.0)
Hemoglobin: 13 g/dL (ref 12.0–15.0)
LYMPHS PCT: 47 %
Lymphs Abs: 3 10*3/uL (ref 0.7–4.0)
MCH: 29 pg (ref 26.0–34.0)
MCHC: 33.1 g/dL (ref 30.0–36.0)
MCV: 87.5 fL (ref 78.0–100.0)
MONO ABS: 0.5 10*3/uL (ref 0.1–1.0)
MONOS PCT: 7 %
NEUTROS ABS: 2.7 10*3/uL (ref 1.7–7.7)
Neutrophils Relative %: 43 %
Platelets: 267 10*3/uL (ref 150–400)
RBC: 4.49 MIL/uL (ref 3.87–5.11)
RDW: 12.5 % (ref 11.5–15.5)
WBC: 6.3 10*3/uL (ref 4.0–10.5)

## 2015-07-26 LAB — PROTIME-INR
INR: 0.96 (ref 0.00–1.49)
Prothrombin Time: 13 seconds (ref 11.6–15.2)

## 2015-07-26 LAB — URINALYSIS, ROUTINE W REFLEX MICROSCOPIC
Bilirubin Urine: NEGATIVE
GLUCOSE, UA: NEGATIVE mg/dL
KETONES UR: NEGATIVE mg/dL
LEUKOCYTES UA: NEGATIVE
Nitrite: POSITIVE — AB
PH: 6 (ref 5.0–8.0)
PROTEIN: NEGATIVE mg/dL
Specific Gravity, Urine: 1.02 (ref 1.005–1.030)

## 2015-07-26 LAB — ACETAMINOPHEN LEVEL: Acetaminophen (Tylenol), Serum: <10 ug/mL — ABNORMAL LOW (ref 10–30)

## 2015-07-26 LAB — TROPONIN I

## 2015-07-26 LAB — RAPID URINE DRUG SCREEN, HOSP PERFORMED
AMPHETAMINES: NOT DETECTED
BARBITURATES: NOT DETECTED
BENZODIAZEPINES: NOT DETECTED
COCAINE: NOT DETECTED
OPIATES: POSITIVE — AB
TETRAHYDROCANNABINOL: NOT DETECTED

## 2015-07-26 LAB — ETHANOL: ALCOHOL ETHYL (B): 340 mg/dL — AB (ref <5)

## 2015-07-26 LAB — URINE MICROSCOPIC-ADD ON

## 2015-07-26 LAB — SALICYLATE LEVEL: Salicylate Lvl: <4 mg/dL (ref 2.8–30.0)

## 2015-07-26 LAB — LACTIC ACID, PLASMA: Lactic Acid, Venous: 2.8 mmol/L (ref 0.5–2.0)

## 2015-07-26 MED ORDER — SODIUM CHLORIDE 0.9 % IV SOLN
INTRAVENOUS | Status: DC
  Administered 2015-07-26 (×2): via INTRAVENOUS

## 2015-07-26 MED ORDER — SODIUM CHLORIDE 0.9 % IV BOLUS (SEPSIS)
1000.0000 mL | Freq: Once | INTRAVENOUS | Status: AC
  Administered 2015-07-26: 1000 mL via INTRAVENOUS

## 2015-07-26 MED ORDER — SODIUM CHLORIDE 0.9 % IV SOLN
INTRAVENOUS | Status: DC
  Administered 2015-07-27: 01:00:00 via INTRAVENOUS

## 2015-07-26 MED ORDER — LORAZEPAM 2 MG/ML IJ SOLN: 1.0000 mg | Freq: Once | INTRAMUSCULAR | Status: DC

## 2015-07-26 MED ORDER — LORAZEPAM 2 MG/ML IJ SOLN
1.0000 mg | Freq: Once | INTRAMUSCULAR | Status: AC
  Administered 2015-07-26: 1 mg via INTRAVENOUS
  Filled 2015-07-26: qty 1

## 2015-07-26 MED ORDER — NALOXONE HCL 2 MG/2ML IJ SOSY
2.0000 mg | PREFILLED_SYRINGE | Freq: Once | INTRAMUSCULAR | Status: AC
  Administered 2015-07-26: 2 mg via INTRAVENOUS
  Filled 2015-07-26: qty 2

## 2015-07-26 MED ORDER — ONDANSETRON HCL 4 MG/2ML IJ SOLN
4.0000 mg | Freq: Once | INTRAMUSCULAR | Status: AC
  Administered 2015-07-26: 4 mg via INTRAVENOUS
  Filled 2015-07-26: qty 2

## 2015-07-26 NOTE — ED Notes (Signed)
Urine to lab, lab into draw- awaiting physician eval
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
## 2015-07-26 NOTE — ED Notes (Signed)
When asked by this nurse if she took meds and alcohol to end her pain or hurt herself pt replied yes. She is tearful apologizing to her friends and stating her whole body hurts

## 2015-07-26 NOTE — ED Provider Notes (Addendum)
CSN: [id number]     Arrival date & time 07/26/15  2052 History  By signing my name below, I, Bethany Barton, attest that this documentation has been prepared under the direction and in the presence of physician practitioner, Vanetta Mulders, MD. Electronically Signed: Linna Barton, Scribe. 07/26/2015. 10:34 PM.   Chief Complaint  Patient presents with  . Altered Mental Status    Patient is a 45 y.o. female presenting with altered mental status. The history is provided by the patient. No language interpreter was used.  Altered Mental Status Presenting symptoms: confusion   Severity:  Severe Most recent episode:  Today Episode history:  Single Duration:  3 hours Timing:  Constant Chronicity:  New Associated symptoms: no abdominal pain, no fever, no headaches, no nausea, no rash and no vomiting      HPI Comments: LEVEL 5 CAVEAT DUE TO MENTAL STATUS CHANGE Bethany Barton is a 45 y.o. female brought in by EMS, with h/o seizure and polysubstance abuse, who presents to the Emergency Department with altered mental status beginning around 2.5 hours ago. Per her neighbors, pt was found unresponsive laying on her sofa around 2.5 hours ago; pt's neighbors went into pt's home and saw her on the couch and called EMS. Per her neighbors, no one could get in contact with pt all day. Pt's neighbors did not notice any alcohol around pt upon entering her home. Per her neighbors, the last time anyone communicated with pt was around 4 hours ago when she opened her door to speak with some other neighbors. Per her neighbors, pt did not leave a suicide note. Per her neighbors, pt denies pain or any other symptoms.  Past Medical History  Diagnosis Date  . Hypertension   . Peripheral neuropathy (HCC)   . Facial tic   . Diabetes mellitus without complication (HCC)   . Polysubstance abuse     etoh, opiates  . Migraines   . Depression   . Anxiety   . Osteoarthritis   . Neuropathic pain of both feet (HCC)      chronic  . Cellulitis     Right foot  . Seizure (HCC)   . [facility])     hospitalized in November 2016  . Chronic foot pain   . Chronic back pain   . Adrenal insufficiency (HCC)   . Fibromyalgia    Past Surgical History  Procedure Laterality Date  . 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 Abdominal hysterectomy  2013  . Ankle fusion Right 1991  . Appendectomy  1987  . Cesarean section  1997  . Abdominoplasty  2013  . Cholecystectomy    . Incision and drainage abscess N/A 09/25/2013    Procedure: INCISION AND DRAINAGE ABSCESS;  Surgeon: Cherylynn Ridges, MD;  Location: [facility] OR;  Service: General;  Laterality: N/A;  . Cholecystectomy    . Esophagogastroduodenoscopy (egd) with propofol N/A 11/03/2014    VHQ:IONGE gastric pouch  . Biopsy N/A 11/03/2014    Procedure: BIOPSY;  Surgeon: Malissa Hippo, MD;  Location: [facility] ORS;  Service: Endoscopy;  Laterality: N/A;   Family History  Problem Relation Age of Onset  . Bipolar disorder Sister   . Drug abuse Sister   . Schizophrenia Son   . Bipolar disorder Father   . Alcohol abuse Father   . Drug abuse Mother   . Alcohol abuse Mother   . Colon cancer Neg Hx    Social History  Substance Use Topics  . Smoking status: Never Smoker   . Smokeless tobacco: Never Used  . Alcohol Use: No     Comment: Denies alcohol since June 2015.   OB History    No data available     Review of Systems  Unable to perform ROS: Mental status change  Constitutional: Negative for fever and chills.  HENT: Negative for rhinorrhea and sore throat.   Eyes: Negative for visual disturbance.  Respiratory: Negative for cough and shortness of breath.   Cardiovascular: Negative for chest pain.  Gastrointestinal: Negative for nausea, vomiting, abdominal pain and diarrhea.  Genitourinary: Negative for dysuria.  Musculoskeletal: Negative for back pain and joint swelling.  Skin: Negative for rash.  Neurological: Negative for headaches.   Hematological: Does not bruise/bleed easily.  Psychiatric/Behavioral: Positive for confusion.    Allergies  Doxycycline; Keflex; Iodine; Lyrica; Neomycin; Nsaids; Phenergan; Sulfa antibiotics; and Hibiclens  Home Medications   Prior to Admission medications   Medication Sig Start Date End Date Taking? Authorizing Provider  Alpha-Lipoic Acid 300 MG CAPS Take by mouth 2 (two) times daily.    Historical Provider, MD  Armodafinil 200 MG TABS TAKE ONE TABLET BY MOUTH ONCE DAILY. 07/03/15   Levert FeinsteinYijun Yan, MD  aspirin-acetaminophen-caffeine (EXCEDRIN MIGRAINE) 4170926252250-250-65 MG tablet Take 1 tablet by mouth every 6 (six) hours as needed for headache.    Historical Provider, MD  clonazePAM (KLONOPIN) 0.5 MG tablet Take 0.5 mg by mouth 2 (two) times daily.  09/13/14   Historical Provider, MD  CLONIDINE HCL PO Take 0.5 mg by mouth 2 (two) times daily.    Historical Provider, MD  DULoxetine (CYMBALTA) 60 MG capsule TAKE (1) CAPSULE BY MOUTH ONCE DAILY. 12/08/14   Levert FeinsteinYijun Yan, MD  eletriptan (RELPAX) 40 MG tablet Take 1 tablet (40 mg total) by mouth as needed for migraine or headache. May repeat in 2 hours if headache persists or recurs. 04/17/15   Levert FeinsteinYijun Yan, MD  ferrous sulfate 325 (65 FE) MG tablet Take 325 mg by mouth daily with breakfast.    Historical Provider, MD  hydrocortisone (CORTEF) 10 MG tablet Take 1 tablet (10 mg total) by mouth 2 (two) times daily. 04/15/15   Carlus Pavlovristina Gherghe, MD  L-Methylfolate-B6-B12 (FOLTANX) 3-35-2 MG TABS Take 1 tablet by mouth 2 (two) times daily. 10/16/14   Historical Provider, MD  losartan (COZAAR) 100 MG tablet Take 100 mg by mouth daily.    Historical Provider, MD  MAGNESIUM PO Take by mouth daily.    Historical Provider, MD  Multiple Vitamin (MULTIVITAMIN WITH MINERALS) TABS tablet Take 1 tablet by mouth daily.    Historical Provider, MD  Naloxone HCl (EVZIO) 0.4 MG/0.4ML SOAJ Inject as directed as needed.    Historical Provider, MD  Omega-3 Fatty Acids (FISH OIL PO) Take by  mouth daily.    Historical Provider, MD  ondansetron (ZOFRAN) 8 MG tablet TAKE 1 TABLET EVERY 8 HOURS AS NEEDED FOR NAUSEA OR VOMITING. 07/03/15   Nira RetortAnna W Sams, NP  Oxycodone HCl 20 MG TABS as needed. 04/04/15   Historical Provider, MD  Potassium 99 MG TABS Take by mouth daily.    Historical Provider, MD  pravastatin (PRAVACHOL) 10 MG tablet daily. 03/22/15   Historical Provider, MD  SUMAtriptan 6 MG/0.5ML SOAJ Inject 6 mg into the skin as needed. 04/17/15   Levert FeinsteinYijun Yan, MD  Topiramate ER (TROKENDI XR) 200 MG CP24 Take 200 mg by mouth at bedtime. 12/25/14   Levert FeinsteinYijun Yan, MD  VITAMIN D, CHOLECALCIFEROL,  PO Take 2,000 Units by mouth daily.    Historical Provider, MD   BP 111/78 mmHg  Pulse 96  Resp 16  Ht  (1.575 m)  Wt 72.576 kg  BMI 29.26 kg/m2  SpO2 100% Physical Exam  Constitutional: She is oriented to person, place, and time. She appears well-developed and well-nourished. No distress.  HENT:  Head: Normocephalic and atraumatic.  Mouth/Throat: Mucous membranes are normal.  Eyes: Conjunctivae and EOM are normal. Pupils are equal, round, and reactive to light.  Pupils normal, 5 mm in size bilaterally.  Neck: Neck supple. No tracheal deviation present.  Cardiovascular: Regular rhythm.  Tachycardia present.   Pulmonary/Chest: Effort normal and breath sounds normal. No respiratory distress. She has no wheezes. She has no rales. She exhibits no tenderness.  Abdominal: Bowel sounds are normal.  Musculoskeletal: Normal range of motion.  Cap refill 1 second to both legs. No leg deformity bilaterally.  Neurological: She is alert and oriented to person, place, and time.  Pt responding to some commands and moving around. Movement of the legs. Movement of the arms.  Skin: Skin is warm and dry.  Psychiatric: She has a normal mood and affect. Her behavior is normal.  Nursing note and vitals reviewed.   ED Course  Procedures (including critical care time)  DIAGNOSTIC STUDIES: Oxygen Saturation  is 100% on RA, normal by my interpretation.    COORDINATION OF CARE: 10:34 PM Discussed treatment plan with pt and her neighbors at bedside and they agreed to plan.  Medications  0.9 %  sodium chloride infusion ( Intravenous New Bag/Given 07/26/15 2303)  0.9 %  sodium chloride infusion (not administered)  LORazepam (ATIVAN) injection 1 mg (not administered)  LORazepam (ATIVAN) injection 1 mg (not administered)  ondansetron (ZOFRAN) injection 4 mg (4 mg Intravenous Given 07/26/15 2302)  sodium chloride 0.9 % bolus 1,000 mL (1,000 mLs Intravenous New Bag/Given 07/26/15 2303)  naloxone [facility]) injection 2 mg (2 mg Intravenous Given 07/26/15 2303)   Results for orders placed or performed during the hospital encounter of 07/26/15  Urine rapid drug screen (hosp performed)  Result Value Ref Range   Opiates POSITIVE (A) NONE DETECTED   Cocaine NONE DETECTED NONE DETECTED   Benzodiazepines NONE DETECTED NONE DETECTED   Amphetamines NONE DETECTED NONE DETECTED   Tetrahydrocannabinol NONE DETECTED NONE DETECTED   Barbiturates NONE DETECTED NONE DETECTED  Urinalysis, Routine w reflex microscopic  Result Value Ref Range   Color, Urine YELLOW YELLOW   APPearance CLEAR CLEAR   Specific Gravity, Urine 1.020 1.005 - 1.030   pH 6.0 5.0 - 8.0   Glucose, UA NEGATIVE NEGATIVE mg/dL   Hgb urine dipstick TRACE (A) NEGATIVE   Bilirubin Urine NEGATIVE NEGATIVE   Ketones, ur NEGATIVE NEGATIVE mg/dL   Protein, ur NEGATIVE NEGATIVE mg/dL   Nitrite POSITIVE (A) NEGATIVE   Leukocytes, UA NEGATIVE NEGATIVE  Acetaminophen level  Result Value Ref Range   Acetaminophen (Tylenol), Serum <10 (L) 10 - 30 ug/mL  Comprehensive metabolic panel  Result Value Ref Range   Sodium 143 135 - 145 mmol/L   Potassium 3.9 3.5 - 5.1 mmol/L   Chloride 110 101 - 111 mmol/L   CO2 23 22 - 32 mmol/L   Glucose, Bld 96 65 - 99 mg/dL   BUN 12 6 - 20 mg/dL   Creatinine, Ser 4.09 0.44 - 1.00 mg/dL   Calcium 8.1 (L) 8.9 - 10.3 mg/dL    Total Protein 5.7 (L) 6.5 - 8.1 g/dL  Albumin 3.0 (L) 3.5 - 5.0 g/dL   AST 38 15 - 41 U/L   ALT 34 14 - 54 U/L   Alkaline Phosphatase 85 38 - 126 U/L   Total Bilirubin 0.2 (L) 0.3 - 1.2 mg/dL   GFR calc non Af Amer >60 >60 mL/min   GFR calc Af Amer >60 >60 mL/min   Anion gap 10 5 - 15  Ethanol  Result Value Ref Range   Alcohol, Ethyl (B) 340 (HH) <5 mg/dL  Salicylate level  Result Value Ref Range   Salicylate Lvl <4.0 2.8 - 30.0 mg/dL  Troponin I  Result Value Ref Range   Troponin I <0.03 <0.031 ng/mL  Lactic acid, plasma  Result Value Ref Range   Lactic Acid, Venous 2.8 (HH) 0.5 - 2.0 mmol/L  CBC with Differential  Result Value Ref Range   WBC 6.3 4.0 - 10.5 K/uL   RBC 4.49 3.87 - 5.11 MIL/uL   Hemoglobin 13.0 12.0 - 15.0 g/dL   HCT 16.1 09.6 - 04.5 %   MCV 87.5 78.0 - 100.0 fL   MCH 29.0 26.0 - 34.0 pg   MCHC 33.1 30.0 - 36.0 g/dL   RDW 40.9 81.1 - 91.4 %   Platelets 267 150 - 400 K/uL   Neutrophils Relative % 43 %   Neutro Abs 2.7 1.7 - 7.7 K/uL   Lymphocytes Relative 47 %   Lymphs Abs 3.0 0.7 - 4.0 K/uL   Monocytes Relative 7 %   Monocytes Absolute 0.5 0.1 - 1.0 K/uL   Eosinophils Relative 2 %   Eosinophils Absolute 0.2 0.0 - 0.7 K/uL   Basophils Relative 1 %   Basophils Absolute 0.0 0.0 - 0.1 K/uL  Protime-INR  Result Value Ref Range   Prothrombin Time 13.0 11.6 - 15.2 seconds   INR 0.96 0.00 - 1.49  Urine microscopic-add on  Result Value Ref Range   Squamous Epithelial / LPF 0-5 (A) NONE SEEN   WBC, UA 0-5 0 - 5 WBC/hpf   RBC / HPF 0-5 0 - 5 RBC/hpf   Bacteria, UA MANY (A) NONE SEEN   Urine-Other MUCOUS PRESENT    Dg Chest Port 1 View  07/26/2015  CLINICAL DATA:  Altered mental status, unresponsive EXAM: PORTABLE CHEST 1 VIEW COMPARISON:  12/06/2014 FINDINGS: The heart size and mediastinal contours are within normal limits. Both lungs are clear. The visualized skeletal structures are unremarkable. IMPRESSION: No active disease. Electronically Signed    By: Alcide Clever M.D.   On: 07/26/2015 21:19   CRITICAL CARE Performed by: Vanetta Mulders Total critical care time: 30 minutes Critical care time was exclusive of separately billable procedures and treating other patients. Critical care was necessary to treat or prevent imminent or life-threatening deterioration. Critical care was time spent personally by me on the following activities: development of treatment plan with patient and/or surrogate as well as nursing, discussions with consultants, evaluation of patient's response to treatment, examination of patient, obtaining history from patient or surrogate, ordering and performing treatments and interventions, ordering and review of laboratory studies, ordering and review of radiographic studies, pulse oximetry and re-evaluation of patient's condition.   MDM   Final diagnoses:  Polysubstance abuse  Alcohol intoxication, with unspecified complication (HCC)  Overdose, intentional self-harm, initial encounter (HCC)  Altered mental status, unspecified altered mental status type   Patient found by family members. Patient seen by a neighbor and appeared relatively normal around 6 PM. Family could not get her to respond so they  broke into her house around 8:00 and she was found unconscious on the floor. Patient brought in by EMS. Patient with a past history of polysubstance abuse with opiates and alcohol but apparently no problems recently. Some question of whether this was maybe a suicide attempt. Patient admitted to the one nurse that it was. But has not been clear since then. Patient received Narcan on the way and low dose became more responsive. Then became more confused. Blood alcohol level was extremely high at 340. Patient given a larger dose of Narcan 2 mg and she has become very responsive. The most of her altered mental status was secondary to opiates and not secondary to alcohol. Patient's chest x-rays negative for pneumonia. Patient will  require medical admission because she's not medically cleared at this point in time. Suspect she will become somnolent again when the Narcan wears off. Urine drug screen was positive for opiates otherwise it was negative. Tylenol level was not elevated.  I personally performed the services described in this documentation, which was scribed in my presence. The recorded information has been reviewed and is accurate.     Vanetta Mulders, MD 07/26/15 2339  Patient admitted to nurse that the overdose was intentional and she was trying to harm herself. She denied that to me. Patient IVC for the patient's protection. Paperwork completed. Patient will be admitted by the medical service.  Vanetta Mulders, MD 07/27/15 [phone]

## 2015-07-26 NOTE — ED Notes (Signed)
Per EMS called out after family/friends found patient unresponsive. Pt was unresponsive to all stimuli upon EMS arrival, breathing with palpable pulse. CBG 120. 2mg  Narcan given nasally, patient woke up briefly after Narcan. Currently patient is awake, moaning, flinches to painful stimuli but will follow no commands.

## 2015-07-26 NOTE — ED Notes (Signed)
Discussion with EDP, Dr Herma CarsonZ to inquire if sitter was needed for pt while in ED- He reports that pt will be an admission

## 2015-07-26 NOTE — ED Notes (Signed)
Per friend, pt is going thru a lot marital problems and depression. Is unaware if psych history

## 2015-07-26 NOTE — ED Notes (Signed)
Assisted lab with drawing lactic acid.

## 2015-07-26 NOTE — ED Notes (Signed)
Call from lab- ETOH 340- MD informed

## 2015-07-26 NOTE — ED Notes (Signed)
Lactic acid report of 2.8 given to Dr ZHerma Carson

## 2015-07-26 NOTE — H&P (Signed)
History and Physical    Bethany Barton OZH:[id number] DOB: Jul 04, 1970 DOA: 07/26/2015  Referring MD/NP/PA: Vanetta Mulders, MD PCP: Cassell Smiles., MD Outpatient Specialists: Gastroenterology; Corbin Ade, MD Patient coming from: home  Chief Complaint: Altered mental status  HPI: Bethany Barton is a 45 y.o. female with medical history significant of polysubstance dependence, Sz, HTN, adrenal insufficiency, DM type 2 was brought to the ED after being found unresponsive by her neighbors, following a suspected drug overdose. While en Route to the ED, patient received one dose of Narcan and became more responsive.  Denies trying to harm herself or taking too much medication, though she has been having recent relationship and legal issues.   While in the ED, she receive a larger  dose of Narcan and became very responsive. Workup showed that her blood alcohol level was extremely high at 340. Toxicology was positive for opiates. Her lactic acid was also notably elevated. Hospitalist was asked to refer for admission.   Review of Systems: As per HPI otherwise 10 point review of systems negative.    Past Medical History  Diagnosis Date  . Hypertension   . Peripheral neuropathy (HCC)   . Facial tic   . Diabetes mellitus without complication (HCC)   . Polysubstance abuse     etoh, opiates  . Migraines   . Depression   . Anxiety   . Osteoarthritis   . Neuropathic pain of both feet (HCC)     chronic  . Cellulitis     Right foot  . Seizure (HCC)   . Ileus [facility])     hospitalized in November 2016  . Chronic foot pain   . Chronic back pain   . Adrenal insufficiency (HCC)   . Fibromyalgia     Past Surgical History  Procedure Laterality Date  . 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 Abdominal hysterectomy  2013  . Ankle fusion Right 1991  . Appendectomy  1987  . Cesarean section  1997  . Abdominoplasty  2013  . Cholecystectomy    . Incision and drainage  abscess N/A 09/25/2013    Procedure: INCISION AND DRAINAGE ABSCESS;  Surgeon: Cherylynn Ridges, MD;  Location: [facility] OR;  Service: General;  Laterality: N/A;  . Cholecystectomy    . Esophagogastroduodenoscopy (egd) with propofol N/A 11/03/2014    GEX:BMWUX gastric pouch  . Biopsy N/A 11/03/2014    Procedure: BIOPSY;  Surgeon: Malissa Hippo, MD;  Location: [facility] ORS;  Service: Endoscopy;  Laterality: N/A;     reports that she has never smoked. She has never used smokeless tobacco. She reports that she does not drink alcohol or use illicit drugs.  Allergies  Allergen Reactions  . Doxycycline Diarrhea and Nausea And Vomiting  . Keflex [Cephalexin] Diarrhea and Nausea And Vomiting  . Iodine Other (See Comments)    Blisters  . Lyrica [Pregabalin]   . Neomycin Other (See Comments)    Blisters   . Nsaids Other (See Comments)    Due to gastric bypass.  [name] [Promethazine Hcl] Other (See Comments)    Altered Mental Status  . Sulfa Antibiotics Nausea And Vomiting  . Hibiclens [Chlorhexidine Gluconate] Rash    Family History  Problem Relation Age of Onset  . Bipolar disorder Sister   . Drug abuse Sister   . Schizophrenia Son   . Bipolar disorder Father   . Alcohol abuse Father   . Drug abuse Mother   .  Alcohol abuse Mother   . Colon cancer Neg Hx     Prior to Admission medications   Medication Sig Start Date End Date Taking? Authorizing Provider  Alpha-Lipoic Acid 300 MG CAPS Take by mouth 2 (two) times daily.    Historical Provider, MD  Armodafinil 200 MG TABS TAKE ONE TABLET BY MOUTH ONCE DAILY. 07/03/15   Levert FeinsteinYijun Yan, MD  aspirin-acetaminophen-caffeine (EXCEDRIN MIGRAINE) [phone] MG tablet Take 1 tablet by mouth every 6 (six) hours as needed for headache.    Historical Provider, MD  clonazePAM (KLONOPIN) 0.5 MG tablet Take 0.5 mg by mouth 2 (two) times daily.  09/13/14   Historical Provider, MD  CLONIDINE HCL PO Take 0.5 mg by mouth 2 (two) times daily.    Historical Provider, MD    DULoxetine (CYMBALTA) 60 MG capsule TAKE (1) CAPSULE BY MOUTH ONCE DAILY. 12/08/14   Levert FeinsteinYijun Yan, MD  eletriptan (RELPAX) 40 MG tablet Take 1 tablet (40 mg total) by mouth as needed for migraine or headache. May repeat in 2 hours if headache persists or recurs. 04/17/15   Levert FeinsteinYijun Yan, MD  ferrous sulfate 325 (65 FE) MG tablet Take 325 mg by mouth daily with breakfast.    Historical Provider, MD  hydrocortisone (CORTEF) 10 MG tablet Take 1 tablet (10 mg total) by mouth 2 (two) times daily. 04/15/15   Carlus Pavlovristina Gherghe, MD  L-Methylfolate-B6-B12 (FOLTANX) 3-35-2 MG TABS Take 1 tablet by mouth 2 (two) times daily. 10/16/14   Historical Provider, MD  losartan (COZAAR) 100 MG tablet Take 100 mg by mouth daily.    Historical Provider, MD  MAGNESIUM PO Take by mouth daily.    Historical Provider, MD  Multiple Vitamin (MULTIVITAMIN WITH MINERALS) TABS tablet Take 1 tablet by mouth daily.    Historical Provider, MD  Naloxone HCl (EVZIO) 0.4 MG/0.4ML SOAJ Inject as directed as needed.    Historical Provider, MD  Omega-3 Fatty Acids (FISH OIL PO) Take by mouth daily.    Historical Provider, MD  ondansetron (ZOFRAN) 8 MG tablet TAKE 1 TABLET EVERY 8 HOURS AS NEEDED FOR NAUSEA OR VOMITING. 07/03/15   Nira RetortAnna W Sams, NP  Oxycodone HCl 20 MG TABS as needed. 04/04/15   Historical Provider, MD  Potassium 99 MG TABS Take by mouth daily.    Historical Provider, MD  pravastatin (PRAVACHOL) 10 MG tablet daily. 03/22/15   Historical Provider, MD  SUMAtriptan 6 MG/0.5ML SOAJ Inject 6 mg into the skin as needed. 04/17/15   Levert FeinsteinYijun Yan, MD  Topiramate ER (TROKENDI XR) 200 MG CP24 Take 200 mg by mouth at bedtime. 12/25/14   Levert FeinsteinYijun Yan, MD  VITAMIN D, CHOLECALCIFEROL, PO Take 2,000 Units by mouth daily.    Historical Provider, MD    Physical Exam: Filed Vitals:   07/26/15 2245 07/26/15 2300 07/26/15 2307 07/26/15 2330  BP: 109/70 119/78 111/78 144/121  Pulse:   96 100  Resp: 15 13 16 23   Height:   5\' 2"  (1.575 m)   Weight:   72.576 kg  (160 lb)   SpO2:   100% 100%      Constitutional: NAD, calm, comfortable Filed Vitals:   07/26/15 2245 07/26/15 2300 07/26/15 2307 07/26/15 2330  BP: 109/70 119/78 111/78 144/121  Pulse:   96 100  Resp: 15 13 16 23   Height:   5\' 2"  (1.575 m)   Weight:   72.576 kg (160 lb)   SpO2:   100% 100%   Eyes: PERRL, lids and conjunctivae normal ENMT: Mucous  membranes are moist. Posterior pharynx clear of any exudate or lesions.Normal dentition.  Neck: normal, supple, no masses, no thyromegaly Respiratory: clear to auscultation bilaterally, no wheezing, no crackles. Normal respiratory effort. No accessory muscle use.  Cardiovascular: Regular rate and rhythm, no murmurs / rubs / gallops. No extremity edema. 2+ pedal pulses. No carotid bruits.  Abdomen: no tenderness, no masses palpated. No hepatosplenomegaly. Bowel sounds positive.  Musculoskeletal: no clubbing / cyanosis. No joint deformity upper and lower extremities. Good ROM, no contractures. Normal muscle tone.  Skin: no rashes, lesions, ulcers. No induration Neurologic: CN 2-12 grossly intact. Sensation intact, DTR normal. Strength 5/5 in all 4.  Psychiatric: Normal judgment and insight. Alert and oriented x 3. Normal mood.   Labs on Admission: I have personally reviewed following labs and imaging studies  CBC:  Recent Labs Lab 07/26/15 2106  WBC 6.3  NEUTROABS 2.7  HGB 13.0  HCT 39.3  MCV 87.5  PLT 267   Basic Metabolic Panel:  Recent Labs Lab 07/26/15 2106  NA 143  K 3.9  CL 110  CO2 23  GLUCOSE 96  BUN 12  CREATININE 0.53  CALCIUM 8.1*   GFR: Estimated Creatinine Clearance: 85.5 mL/min (by C-G formula based on Cr of 0.53). Liver Function Tests:  Recent Labs Lab 07/26/15 2106  AST 38  ALT 34  ALKPHOS 85  BILITOT 0.2*  PROT 5.7*  ALBUMIN 3.0*   Coagulation Profile:  Recent Labs Lab 07/26/15 2106  INR 0.96   Cardiac Enzymes:  Recent Labs Lab 07/26/15 2106  TROPONINI <0.03   Urine  analysis:    Component Value Date/Time   COLORURINE YELLOW 07/26/2015 2103   APPEARANCEUR CLEAR 07/26/2015 2103   LABSPEC 1.020 07/26/2015 2103   PHURINE 6.0 07/26/2015 2103   GLUCOSEU NEGATIVE 07/26/2015 2103   HGBUR TRACE* 07/26/2015 2103   BILIRUBINUR NEGATIVE 07/26/2015 2103   KETONESUR NEGATIVE 07/26/2015 2103   PROTEINUR NEGATIVE 07/26/2015 2103   UROBILINOGEN 0.2 12/05/2014 1109   NITRITE POSITIVE* 07/26/2015 2103   LEUKOCYTESUR NEGATIVE 07/26/2015 2103    Radiological Exams on Admission: Dg Chest Port 1 View  07/26/2015  CLINICAL DATA:  Altered mental status, unresponsive EXAM: PORTABLE CHEST 1 VIEW COMPARISON:  12/06/2014 FINDINGS: The heart size and mediastinal contours are within normal limits. Both lungs are clear. The visualized skeletal structures are unremarkable. IMPRESSION: No active disease. Electronically Signed   By: Alcide Clever M.D.   On: 07/26/2015 21:19    EKG: Independently reviewed.   Assessment/Plan Principal Problem:   Accidental overdose Active Problems:   Opioid dependence (HCC)   Hypertension   Adrenal insufficiency (HCC)   Alcohol intoxication (HCC)  1. AMS. Due to accidental drug overdose. Patient has been having recent relationship and legal problems. She has improved with Narcan, but will need further monitoring once Narcan has worn off. Toxicology positive for EtOH, 340, and opiates. Please consult psyche tomorrow (ordered in epics) 2. Alcohol intoxication. Pt has a hx of alcohol dependence.  Will place on CIWA monitoring.  3. Opioid dependence.  4. HTN. Will give antihypertensives. 5. Adrenal insufficiency. Will give IV solu-cortef x 3.  Please resume oral hydrocortisone once she is more awake.   DVT prophylaxis: Lovenox Code Status: Full Family Communication: Discussed with patient and multiple friends, who are nurses at [facility]. Claude Manges' phone number is [phone]. Sister, Misty's phone number is [phone].   Disposition Plan: Will involuntarily commit. Discharge once stable.  Consults called:  Admission status: Admit to stepdown.  Houston Siren,  MD FACP [facility]  If 7PM-7AM, please contact night-coverage [email or web address] Password [email or web address]  07/26/2015, 11:41 PM   By signing my name below, I, Adron Bene, attest that this documentation has been prepared under the direction and in the presence of Houston Siren, MD. Electronically Signed: Adron Bene, Scribe 07/26/2015 11:40pm

## 2015-07-26 NOTE — ED Notes (Signed)
Pt screaming out- then will state to her friends"I don't want to be like this". Ativan has been given per MD orders

## 2015-07-27 ENCOUNTER — Encounter (HOSPITAL_COMMUNITY): Payer: Self-pay | Admitting: *Deleted

## 2015-07-27 ENCOUNTER — Encounter (HOSPITAL_COMMUNITY): Payer: Self-pay | Admitting: Internal Medicine

## 2015-07-27 ENCOUNTER — Observation Stay (HOSPITAL_COMMUNITY)
Admission: AD | Admit: 2015-07-27 | Discharge: 2015-07-28 | Disposition: A | Discharge status: Home / Self Care | Payer: BLUE CROSS/BLUE SHIELD | Source: Intra-hospital | Attending: Medical | Admitting: Medical

## 2015-07-27 DIAGNOSIS — M797 Fibromyalgia: Secondary | ICD-10-CM | POA: Insufficient documentation

## 2015-07-27 DIAGNOSIS — T510X1A Toxic effect of ethanol, accidental (unintentional), initial encounter: Secondary | ICD-10-CM | POA: Diagnosis not present

## 2015-07-27 DIAGNOSIS — E1140 Type 2 diabetes mellitus with diabetic neuropathy, unspecified: Secondary | ICD-10-CM | POA: Insufficient documentation

## 2015-07-27 DIAGNOSIS — F1120 Opioid dependence, uncomplicated: Secondary | ICD-10-CM

## 2015-07-27 DIAGNOSIS — Y9289 Other specified places as the place of occurrence of the external cause: Secondary | ICD-10-CM | POA: Diagnosis not present

## 2015-07-27 DIAGNOSIS — Z881 Allergy status to other antibiotic agents status: Secondary | ICD-10-CM | POA: Insufficient documentation

## 2015-07-27 DIAGNOSIS — Z9884 Bariatric surgery status: Secondary | ICD-10-CM | POA: Insufficient documentation

## 2015-07-27 DIAGNOSIS — Z888 Allergy status to other drugs, medicaments and biological substances status: Secondary | ICD-10-CM | POA: Diagnosis not present

## 2015-07-27 DIAGNOSIS — F10121 Alcohol abuse with intoxication delirium: Secondary | ICD-10-CM

## 2015-07-27 DIAGNOSIS — F329 Major depressive disorder, single episode, unspecified: Secondary | ICD-10-CM | POA: Diagnosis not present

## 2015-07-27 DIAGNOSIS — F419 Anxiety disorder, unspecified: Secondary | ICD-10-CM | POA: Insufficient documentation

## 2015-07-27 DIAGNOSIS — T50901D Poisoning by unspecified drugs, medicaments and biological substances, accidental (unintentional), subsequent encounter: Secondary | ICD-10-CM | POA: Diagnosis not present

## 2015-07-27 DIAGNOSIS — I10 Essential (primary) hypertension: Secondary | ICD-10-CM | POA: Insufficient documentation

## 2015-07-27 DIAGNOSIS — M1990 Unspecified osteoarthritis, unspecified site: Secondary | ICD-10-CM | POA: Insufficient documentation

## 2015-07-27 DIAGNOSIS — F1114 Opioid abuse with opioid-induced mood disorder: Secondary | ICD-10-CM | POA: Diagnosis not present

## 2015-07-27 DIAGNOSIS — Z882 Allergy status to sulfonamides status: Secondary | ICD-10-CM | POA: Diagnosis not present

## 2015-07-27 DIAGNOSIS — E2740 Unspecified adrenocortical insufficiency: Secondary | ICD-10-CM

## 2015-07-27 DIAGNOSIS — Z91041 Radiographic dye allergy status: Secondary | ICD-10-CM | POA: Diagnosis not present

## 2015-07-27 DIAGNOSIS — T50901A Poisoning by unspecified drugs, medicaments and biological substances, accidental (unintentional), initial encounter: Secondary | ICD-10-CM | POA: Diagnosis present

## 2015-07-27 DIAGNOSIS — F1990 Other psychoactive substance use, unspecified, uncomplicated: Secondary | ICD-10-CM | POA: Diagnosis present

## 2015-07-27 DIAGNOSIS — Z62810 Personal history of physical and sexual abuse in childhood: Secondary | ICD-10-CM | POA: Diagnosis not present

## 2015-07-27 DIAGNOSIS — F10929 Alcohol use, unspecified with intoxication, unspecified: Secondary | ICD-10-CM

## 2015-07-27 DIAGNOSIS — N390 Urinary tract infection, site not specified: Secondary | ICD-10-CM

## 2015-07-27 DIAGNOSIS — T402X1A Poisoning by other opioids, accidental (unintentional), initial encounter: Secondary | ICD-10-CM | POA: Diagnosis present

## 2015-07-27 LAB — CBC
HCT: 38.8 % (ref 36.0–46.0)
Hemoglobin: 12.8 g/dL (ref 12.0–15.0)
MCH: 28.4 pg (ref 26.0–34.0)
MCHC: 33 g/dL (ref 30.0–36.0)
MCV: 86.2 fL (ref 78.0–100.0)
PLATELETS: 232 10*3/uL (ref 150–400)
RBC: 4.5 MIL/uL (ref 3.87–5.11)
RDW: 12.5 % (ref 11.5–15.5)
WBC: 4.9 10*3/uL (ref 4.0–10.5)

## 2015-07-27 LAB — CREATININE, SERUM: CREATININE: 0.46 mg/dL (ref 0.44–1.00)

## 2015-07-27 LAB — LACTIC ACID, PLASMA: Lactic Acid, Venous: 2.8 mmol/L (ref 0.5–2.0)

## 2015-07-27 LAB — TSH: TSH: 1.441 u[IU]/mL (ref 0.350–4.500)

## 2015-07-27 LAB — MRSA PCR SCREENING: MRSA BY PCR: INVALID — AB

## 2015-07-27 MED ORDER — LOSARTAN POTASSIUM 50 MG PO TABS
100.0000 mg | ORAL_TABLET | Freq: Every day | ORAL | Status: DC
Start: 2015-07-27 — End: 2015-07-27
  Administered 2015-07-27: 100 mg via ORAL
  Filled 2015-07-27 (×2): qty 2

## 2015-07-27 MED ORDER — DULOXETINE HCL 60 MG PO CPEP
60.0000 mg | ORAL_CAPSULE | Freq: Every day | ORAL | Status: DC
  Administered 2015-07-27: 60 mg via ORAL
  Filled 2015-07-27 (×2): qty 1

## 2015-07-27 MED ORDER — DEXTROSE-NACL 5-0.9 % IV SOLN
INTRAVENOUS | Status: DC
  Administered 2015-07-27 (×2): via INTRAVENOUS

## 2015-07-27 MED ORDER — SODIUM CHLORIDE 0.9% FLUSH
3.0000 mL | Freq: Two times a day (BID) | INTRAVENOUS | Status: DC
  Administered 2015-07-27: 3 mL via INTRAVENOUS

## 2015-07-27 MED ORDER — TOPIRAMATE ER 200 MG PO CAP24: 200.0000 mg | ORAL_CAPSULE | Freq: Every day | ORAL | Status: DC

## 2015-07-27 MED ORDER — CLONIDINE HCL 0.1 MG PO TABS
0.1000 mg | ORAL_TABLET | Freq: Two times a day (BID) | ORAL | Status: DC
  Administered 2015-07-27: 0.1 mg via ORAL
  Filled 2015-07-27 (×2): qty 1

## 2015-07-27 MED ORDER — ARMODAFINIL 200 MG PO TABS: 1.0000 | ORAL_TABLET | Freq: Every day | ORAL | Status: DC

## 2015-07-27 MED ORDER — ADULT MULTIVITAMIN W/MINERALS CH: 1.0000 | ORAL_TABLET | Freq: Every day | ORAL | Status: DC

## 2015-07-27 MED ORDER — PRAVASTATIN SODIUM 10 MG PO TABS
10.0000 mg | ORAL_TABLET | Freq: Every day | ORAL | Status: DC
  Administered 2015-07-27: 10 mg via ORAL
  Filled 2015-07-27: qty 1

## 2015-07-27 MED ORDER — THIAMINE HCL 100 MG/ML IJ SOLN: 100.0000 mg | Freq: Every day | INTRAMUSCULAR | Status: DC

## 2015-07-27 MED ORDER — VITAMIN B-1 100 MG PO TABS: 100.0000 mg | ORAL_TABLET | Freq: Every day | ORAL | Status: DC

## 2015-07-27 MED ORDER — HYDROXYZINE HCL 25 MG PO TABS
25.0000 mg | ORAL_TABLET | Freq: Four times a day (QID) | ORAL | Status: DC | PRN
Start: 2015-07-27 — End: 2015-07-28
  Administered 2015-07-27: 25 mg via ORAL
  Filled 2015-07-27: qty 1

## 2015-07-27 MED ORDER — FOLIC ACID 1 MG PO TABS: 1.0000 mg | ORAL_TABLET | Freq: Every day | ORAL | Status: DC

## 2015-07-27 MED ORDER — KETOROLAC TROMETHAMINE 30 MG/ML IJ SOLN
30.0000 mg | Freq: Four times a day (QID) | INTRAMUSCULAR | Status: DC | PRN
  Administered 2015-07-27 (×2): 30 mg via INTRAVENOUS
  Filled 2015-07-27 (×2): qty 1

## 2015-07-27 MED ORDER — MORPHINE SULFATE 15 MG PO TABS
30.0000 mg | ORAL_TABLET | ORAL | Status: DC | PRN
  Administered 2015-07-27: 30 mg via ORAL
  Filled 2015-07-27: qty 2

## 2015-07-27 MED ORDER — CLONIDINE HCL 0.2 MG PO TABS
0.2000 mg | ORAL_TABLET | Freq: Four times a day (QID) | ORAL | Status: DC
  Administered 2015-07-27: 0.2 mg via ORAL
  Filled 2015-07-27: qty 1

## 2015-07-27 MED ORDER — HYDROCORTISONE 10 MG PO TABS
10.0000 mg | ORAL_TABLET | Freq: Two times a day (BID) | ORAL | Status: DC
  Filled 2015-07-27 (×3): qty 1

## 2015-07-27 MED ORDER — FOLIC ACID 1 MG PO TABS
1.0000 mg | ORAL_TABLET | Freq: Every day | ORAL | Status: DC
  Administered 2015-07-27: 1 mg via ORAL
  Filled 2015-07-27: qty 1

## 2015-07-27 MED ORDER — CLONIDINE HCL 0.1 MG PO TABS
0.2000 mg | ORAL_TABLET | Freq: Four times a day (QID) | ORAL | Status: DC
  Administered 2015-07-27 – 2015-07-28 (×2): 0.2 mg via ORAL
  Filled 2015-07-27 (×2): qty 2

## 2015-07-27 MED ORDER — CLONAZEPAM 0.5 MG PO TABS
0.5000 mg | ORAL_TABLET | Freq: Two times a day (BID) | ORAL | Status: DC
  Administered 2015-07-27: 0.5 mg via ORAL
  Filled 2015-07-27 (×2): qty 1

## 2015-07-27 MED ORDER — ADULT MULTIVITAMIN W/MINERALS CH
1.0000 | ORAL_TABLET | Freq: Every day | ORAL | Status: DC
  Administered 2015-07-27: 1 via ORAL
  Filled 2015-07-27: qty 1

## 2015-07-27 MED ORDER — VITAMIN B-1 100 MG PO TABS
100.0000 mg | ORAL_TABLET | Freq: Every day | ORAL | Status: DC
  Administered 2015-07-27: 100 mg via ORAL
  Filled 2015-07-27: qty 1

## 2015-07-27 MED ORDER — HYDROCORTISONE NA SUCCINATE PF 100 MG IJ SOLR
50.0000 mg | Freq: Three times a day (TID) | INTRAMUSCULAR | Status: DC
  Administered 2015-07-27 (×2): 50 mg via INTRAVENOUS
  Filled 2015-07-27 (×2): qty 2

## 2015-07-27 MED ORDER — ENOXAPARIN SODIUM 40 MG/0.4ML ~~LOC~~ SOLN
40.0000 mg | SUBCUTANEOUS | Status: DC
  Administered 2015-07-27: 40 mg via SUBCUTANEOUS
  Filled 2015-07-27: qty 0.4

## 2015-07-27 MED ORDER — FERROUS SULFATE 325 (65 FE) MG PO TABS: 325.0000 mg | ORAL_TABLET | Freq: Every day | ORAL | Status: DC

## 2015-07-27 NOTE — Progress Notes (Signed)
Received verbal order from MD for pt to have oxymorphone 10 mg q 4hrs and clonidine 0.2 mg 4 times daily.

## 2015-07-27 NOTE — Progress Notes (Signed)
[facility]. All questions were answered and no further questions at this time. Pt in stable condition and in no acute distress. [facility].

## 2015-07-27 NOTE — Progress Notes (Signed)
Pt requested Tobramax. MD E-paged.

## 2015-07-27 NOTE — Progress Notes (Signed)
PROGRESS NOTE    Bethany Barton:[id number] DOB: 12/28/1970 DOA: 07/26/2015 PCP: Cassell SmilesFUSCO,LAWRENCE J., MD     Brief Narrative:  45 year old woman admitted on 5/27 after found unresponsive by neighbors. She admits to taking more for pain medicine than is usual for her on top of a large amount of alcohol due to some stressful relationships. She improved with Narcan. She is currently fully alert and oriented and is awaiting psychiatric evaluation.   Assessment & Plan:   Principal Problem:   Accidental overdose Active Problems:   Opioid dependence (HCC)   Hypertension   Adrenal insufficiency (HCC)   Alcohol intoxication (HCC)   Drug and alcohol overdose -Likely accidental, she does not admit to suicidal ideation. -She is currently alert.  -Await psychiatric evaluation.  Adrenal insufficiency -Restart oral hydrocortisone.  Opioid dependence -Continue oxycodone as dosed at home, will not be discharged with narcotic prescriptions.  Acute alcoholic intoxication -Thiamine/folate, counseled on cessation.   DVT prophylaxis: Lovenox Code Status: Full code Family Communication: Friend at bedside updated on plan of care and all questions answered Disposition Plan: Transfer to floor, likely discharge home pending psychiatry evaluation in 24 hours.  Consultants:   Psychiatry  Procedures:   None  Antimicrobials:   None    Subjective: Feels much improved, wants her pain meds restarted  Objective: Filed Vitals:   07/27/15 0600 07/27/15 0700 07/27/15 0800 07/27/15 0900  BP: 97/56 126/76 124/87 146/93  Pulse: 98 94 95 105  Temp:      TempSrc:      Resp: 20 19 20 15   Height:      Weight:      SpO2: 97% 94% 95% 98%    Intake/Output Summary (Last 24 hours) at 07/27/15 1018 Last data filed at 07/27/15 0600  Gross per 24 hour  Intake 568.33 ml  Output      0 ml  Net 568.33 ml   Filed Weights   07/26/15 2307 07/27/15 0145  Weight: 72.576 kg (160 lb) 73.8 kg  (162 lb 11.2 oz)    Examination:  General exam: Alert, awake, oriented x 3 Respiratory system: Clear to auscultation. Respiratory effort normal. Cardiovascular system:RRR. No murmurs, rubs, gallops. Gastrointestinal system: Abdomen is nondistended, soft and nontender. No organomegaly or masses felt. Normal bowel sounds heard. Central nervous system: Alert and oriented. No focal neurological deficits. Extremities: No C/C/E, +pedal pulses Skin: No rashes, lesions or ulcers Psychiatry: Judgement and insight appear normal. Mood & affect appropriate.     Data Reviewed: I have personally reviewed following labs and imaging studies  CBC:  Recent Labs Lab 07/26/15 2106 07/27/15 0449  WBC 6.3 4.9  NEUTROABS 2.7  --   HGB 13.0 12.8  HCT 39.3 38.8  MCV 87.5 86.2  PLT 267 232   Basic Metabolic Panel:  Recent Labs Lab 07/26/15 2106 07/27/15 0449  NA 143  --   K 3.9  --   CL 110  --   CO2 23  --   GLUCOSE 96  --   BUN 12  --   CREATININE 0.53 0.46  CALCIUM 8.1*  --    GFR: Estimated Creatinine Clearance: 88.2 mL/min (by C-G formula based on Cr of 0.46). Liver Function Tests:  Recent Labs Lab 07/26/15 2106  AST 38  ALT 34  ALKPHOS 85  BILITOT 0.2*  PROT 5.7*  ALBUMIN 3.0*   No results for input(s): LIPASE, AMYLASE in the last 168 hours. No results for input(s): AMMONIA in the last 168  hours. Coagulation Profile:  Recent Labs Lab 07/26/15 2106  INR 0.96   Cardiac Enzymes:  Recent Labs Lab 07/26/15 2106  TROPONINI <0.03   BNP (last 3 results) No results for input(s): PROBNP in the last 8760 hours. HbA1C: No results for input(s): HGBA1C in the last 72 hours. CBG: No results for input(s): GLUCAP in the last 168 hours. Lipid Profile: No results for input(s): CHOL, HDL, LDLCALC, TRIG, CHOLHDL, LDLDIRECT in the last 72 hours. Thyroid Function Tests:  Recent Labs  07/26/15 2119  TSH 1.441   Anemia Panel: No results for input(s): VITAMINB12, FOLATE,  FERRITIN, TIBC, IRON, RETICCTPCT in the last 72 hours. Urine analysis:    Component Value Date/Time   COLORURINE YELLOW 07/26/2015 2103   APPEARANCEUR CLEAR 07/26/2015 2103   LABSPEC 1.020 07/26/2015 2103   PHURINE 6.0 07/26/2015 2103   GLUCOSEU NEGATIVE 07/26/2015 2103   HGBUR TRACE* 07/26/2015 2103   BILIRUBINUR NEGATIVE 07/26/2015 2103   KETONESUR NEGATIVE 07/26/2015 2103   PROTEINUR NEGATIVE 07/26/2015 2103   UROBILINOGEN 0.2 12/05/2014 1109   NITRITE POSITIVE* 07/26/2015 2103   LEUKOCYTESUR NEGATIVE 07/26/2015 2103   Sepsis Labs: (procalcitonin:4,lacticidven:4)  )No results found for this or any previous visit (from the past 240 hour(s)).       Radiology Studies: Dg Chest Port 1 View  07/26/2015  CLINICAL DATA:  Altered mental status, unresponsive EXAM: PORTABLE CHEST 1 VIEW COMPARISON:  12/06/2014 FINDINGS: The heart size and mediastinal contours are within normal limits. Both lungs are clear. The visualized skeletal structures are unremarkable. IMPRESSION: No active disease. Electronically Signed   By: Alcide Clever M.D.   On: 07/26/2015 21:19        Scheduled Meds: . Armodafinil  1 tablet Oral Daily  . clonazePAM  0.5 mg Oral BID  . cloNIDine  0.1 mg Oral BID  . DULoxetine  60 mg Oral Daily  . enoxaparin (LOVENOX) injection  40 mg Subcutaneous Q24H  . [START ON 07/28/2015] ferrous sulfate  325 mg Oral Q breakfast  . folic acid  1 mg Oral Daily  . hydrocortisone  10 mg Oral BID  . losartan  100 mg Oral Daily  . multivitamin with minerals  1 tablet Oral Daily  . multivitamin with minerals  1 tablet Oral Daily  . pravastatin  10 mg Oral q1800  . sodium chloride flush  3 mL Intravenous Q12H  . thiamine  100 mg Oral Daily   Or  . thiamine  100 mg Intravenous Daily  . Topiramate ER  200 mg Oral QHS   Continuous Infusions: . dextrose 5 % and 0.9% NaCl 100 mL/hr at 07/27/15 0243        Time spent: 25 minutes. Greater than 50% of this time was spent  in direct contact with the patient coordinating care.     Chaya Jan, MD [facility] Pager [phone]  If 7PM-7AM, please contact night-coverage [email or web address] Password [email or web address] 07/27/2015, 10:18 AM

## 2015-07-27 NOTE — Discharge Summary (Signed)
Physician Discharge Summary  Fabio AsaBrandy Barton XXXTerry Barton:[id number] DOB: 04/02/1970 DOA: 07/26/2015  PCP: Cassell SmilesFUSCO,LAWRENCE J., MD  Admit date: 07/26/2015 Discharge date: 07/27/2015  Time spent: 45 minutes  Recommendations for Outpatient Follow-up:  -[facility].   Discharge Diagnoses:  Principal Problem:   Accidental overdose Active Problems:   Opioid dependence (HCC)   Hypertension   Adrenal insufficiency (HCC)   Alcohol intoxication (HCC)   Discharge Condition: Stable and improved  Filed Weights   07/26/15 2307 07/27/15 0145  Weight: 72.576 kg (160 lb) 73.8 kg (162 lb 11.2 oz)    History of present illness:  As per Dr. Conley RollsLe on 5/27: Vertell LimberBrandy Barton Barton is a 45 y.o. female with medical history significant of polysubstance dependence, Sz, HTN, adrenal insufficiency, DM type 2 was brought to the ED after being found unresponsive by her neighbors, following a suspected drug overdose. While en Route to the ED, patient received one dose of Narcan and became more responsive. Denies trying to harm herself or taking too much medication, though she has been having recent relationship and legal issues.   While in the ED, she receive a larger 2mg  dose of Narcan and became very responsive. Workup showed that her blood alcohol level was extremely high at 340. Toxicology was positive for opiates. Her lactic acid was also notably elevated. Hospitalist was asked to refer for admission.   Hospital Course:   Drug and alcohol overdose -Likely accidental, she does not admit to suicidal ideation. -She is currently alert.  -Await psychiatric evaluation.  Adrenal insufficiency -Restart oral hydrocortisone.  Opioid dependence -Continue oxycodone as dosed at home, will not be discharged with narcotic prescriptions.  Acute alcoholic intoxication -Thiamine/folate, counseled on cessation.  Procedures:  None   Consultations:  None  Discharge  Instructions  Discharge Instructions    Increase activity slowly    Complete by:  As directed             Medication List    TAKE these medications        Alpha-Lipoic Acid 300 MG Caps  Take by mouth 2 (two) times daily.     Armodafinil 200 MG Tabs  TAKE ONE TABLET BY MOUTH ONCE DAILY.     aspirin-acetaminophen-caffeine 250-250-65 MG tablet  Commonly known as:  EXCEDRIN MIGRAINE  Take 1 tablet by mouth every 6 (six) hours as needed for headache.     clonazePAM 0.5 MG tablet  Commonly known as:  KLONOPIN  Take 0.5 mg by mouth 2 (two) times daily.     CLONIDINE HCL PO  Take 0.5 mg by mouth 2 (two) times daily.     DULoxetine 60 MG capsule  Commonly known as:  CYMBALTA  TAKE (1) CAPSULE BY MOUTH ONCE DAILY.     eletriptan 40 MG tablet  Commonly known as:  RELPAX  Take 1 tablet (40 mg total) by mouth as needed for migraine or headache. May repeat in 2 hours if headache persists or recurs.     EVZIO 0.4 MG/0.4ML Soaj  Generic drug:  Naloxone HCl  Inject as directed as needed.     ferrous sulfate 325 (65 FE) MG tablet  Take 325 mg by mouth daily with breakfast.     FISH OIL PO  Take by mouth daily.     FOLTANX 3-35-2 MG Tabs  Take 1 tablet by mouth 2 (two) times daily.     hydrocortisone 10 MG tablet  Commonly known as:  CORTEF  Take 1 tablet (10 mg total) by mouth 2 (two) times daily.     losartan 100 MG tablet  Commonly known as:  COZAAR  Take 100 mg by mouth daily.     MAGNESIUM PO  Take by mouth daily.     multivitamin with minerals Tabs tablet  Take 1 tablet by mouth daily.     ondansetron 8 MG tablet  Commonly known as:  ZOFRAN  TAKE 1 TABLET EVERY 8 HOURS AS NEEDED FOR NAUSEA OR VOMITING.     Oxycodone HCl 20 MG Tabs  as needed.     Potassium 99 MG Tabs  Take by mouth daily.     pravastatin 10 MG tablet  Commonly known as:  PRAVACHOL  daily.     SUMAtriptan 6 MG/0.5ML Soaj  Inject 6 mg into the skin as needed.     Topiramate ER 200 MG  Cp24  Commonly known as:  TROKENDI XR  Take 200 mg by mouth at bedtime.     VITAMIN D (CHOLECALCIFEROL) PO  Take 2,000 Units by mouth daily.       Allergies  Allergen Reactions  . Doxycycline Diarrhea and Nausea And Vomiting  . Keflex [Cephalexin] Diarrhea and Nausea And Vomiting  . Iodine Other (See Comments)    Blisters  . Lyrica [Pregabalin]   . Neomycin Other (See Comments)    Blisters   . Nsaids Other (See Comments)    Due to gastric bypass.  [name] [Promethazine Hcl] Other (See Comments)    Altered Mental Status  . Sulfa Antibiotics Nausea And Vomiting  . Hibiclens [Chlorhexidine Gluconate] Rash      The results of significant diagnostics from this hospitalization (including imaging, microbiology, ancillary and laboratory) are listed below for reference.    Significant Diagnostic Studies: Dg Chest Port 1 View  07/26/2015  CLINICAL DATA:  Altered mental status, unresponsive EXAM: PORTABLE CHEST 1 VIEW COMPARISON:  12/06/2014 FINDINGS: The heart size and mediastinal contours are within normal limits. Both lungs are clear. The visualized skeletal structures are unremarkable. IMPRESSION: No active disease. Electronically Signed   By: Alcide Clever M.D.   On: 07/26/2015 21:19    Microbiology: Recent Results (from the past 240 hour(s))  MRSA PCR Screening     Status: Abnormal   Collection Time: 07/27/15 11:47 AM  Result Value Ref Range Status   MRSA by PCR INVALID RESULTS, SPECIMEN SENT FOR CULTURE (A) NEGATIVE Final    Comment: BULLINS L. AT 1434 ON [id number] BY THOMPSON S.        The GeneXpert MRSA Assay (FDA approved for NASAL specimens only), is one component of a comprehensive MRSA colonization surveillance program. It is not intended to diagnose MRSA infection nor to guide or monitor treatment for MRSA infections.      Labs: Basic Metabolic Panel:  Recent Labs Lab 07/26/15 2106 07/27/15 0449  NA 143  --   K 3.9  --   CL 110  --   CO2 23  --     GLUCOSE 96  --   BUN 12  --   CREATININE 0.53 0.46  CALCIUM 8.1*  --    Liver Function Tests:  Recent Labs Lab 07/26/15 2106  AST 38  ALT 34  ALKPHOS 85  BILITOT 0.2*  PROT 5.7*  ALBUMIN 3.0*   No results for input(s): LIPASE, AMYLASE in the last 168 hours. No results for input(s): AMMONIA in the last 168 hours. CBC:  Recent Labs Lab 07/26/15  2106 07/27/15 0449  WBC 6.3 4.9  NEUTROABS 2.7  --   HGB 13.0 12.8  HCT 39.3 38.8  MCV 87.5 86.2  PLT 267 232   Cardiac Enzymes:  Recent Labs Lab 07/26/15 2106  TROPONINI <0.03   BNP: BNP (last 3 results) No results for input(s): BNP in the last 8760 hours.  ProBNP (last 3 results) No results for input(s): PROBNP in the last 8760 hours.  CBG: No results for input(s): GLUCAP in the last 168 hours.     SignedChaya Jan  [facility] Pager: [phone] 07/27/2015, 3:56 PM

## 2015-07-27 NOTE — Progress Notes (Signed)
Per pt request, removed Joyice FasterJerry Coe as primary emergency contact.

## 2015-07-27 NOTE — ED Notes (Signed)
lactic acid of 2.8 . Dr Nedra HaiLee informed

## 2015-07-27 NOTE — Progress Notes (Signed)
Informed by [facility] worker Simonne Come(Leo) that patient has a bed in observation unit and Dr. Lucianne MussKumar is the receiving Dr. Informed that patient can be transferred any time after 7.30 p.m.

## 2015-07-27 NOTE — ED Notes (Addendum)
Patient emergency contact: Berta MinorStephanie Williams [phone] that number may be  [phone]

## 2015-07-27 NOTE — ED Notes (Signed)
Report to Robbie, RN ICU 

## 2015-07-27 NOTE — Progress Notes (Signed)
[facility] that Morphine (MSIR) 30 mg will be substituted for oxymorphone 10 mg.

## 2015-07-27 NOTE — Progress Notes (Signed)
Called report to Bethany CancelLisa Bullins, RN on dept 300. Verbalized understanding. Pt transferred to room 302 in safe and stable condition.
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
## 2015-07-27 NOTE — ED Notes (Signed)
Pt unable to use bedpan- She is assisted to Wheelchair. She is repetitive stating she must stop online transactions by a female- She is redirected that she is in the hospital and has been for several hours

## 2015-07-28 DIAGNOSIS — F1990 Other psychoactive substance use, unspecified, uncomplicated: Secondary | ICD-10-CM | POA: Diagnosis not present

## 2015-07-28 DIAGNOSIS — N390 Urinary tract infection, site not specified: Secondary | ICD-10-CM

## 2015-07-28 DIAGNOSIS — T40601A Poisoning by unspecified narcotics, accidental (unintentional), initial encounter: Secondary | ICD-10-CM

## 2015-07-28 DIAGNOSIS — T510X1A Toxic effect of ethanol, accidental (unintentional), initial encounter: Secondary | ICD-10-CM | POA: Diagnosis not present

## 2015-07-28 MED ORDER — CIPROFLOXACIN HCL 250 MG PO TABS: 500.0000 mg | ORAL_TABLET | Freq: Two times a day (BID) | ORAL | Status: DC

## 2015-07-28 MED ORDER — CIPROFLOXACIN HCL 500 MG PO TABS: 500.0000 mg | ORAL_TABLET | Freq: Two times a day (BID) | ORAL | Status: DC

## 2015-07-28 NOTE — Discharge Instructions (Signed)
Patient to follow with the following agency upon discharge:  [facility] [address] [place], [place] [address] [phone]

## 2015-07-28 NOTE — Progress Notes (Signed)
[facility] INPATIENT:  Family/Significant Other Suicide Prevention Education  Suicide Prevention Education:  Patient Refusal for Family/Significant Other Suicide Prevention Education: The patient Bethany Barton has refused to provide written consent for family/significant other to be provided Family/Significant Other Suicide Prevention Education during admission and/or prior to discharge.  Physician notified.  Bethany Barton, Bethany Barton 07/28/2015, 12:36 AM
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
## 2015-07-28 NOTE — H&P (Signed)
[facility] Provider Admission PAA/H&P  Patient Identification: Bethany Barton MRN:  [id number] Date of Evaluation:  07/28/2015 Chief Complaint:  SUBSTANCE USE DISORDER Principal Diagnosis: <principal problem not specified> Diagnosis:   Patient Active Problem List   Diagnosis Date Noted  . Accidental overdose [T50.901A] 07/27/2015  . Alcohol intoxication ([place]) [F10.129] 07/27/2015  . Substance use disorder [F19.90] 07/27/2015  . Abnormality of gait [R26.9] 04/17/2015  . Nausea without vomiting [R11.0] 12/17/2014  . Adrenal insufficiency ([place]) [E27.40]   . Ileus ([place]) [K56.7] 10/31/2014  . Nausea with vomiting [R11.2] 10/31/2014  . Gastritis and gastroduodenitis [K29.70, K21.90]   . AKI (acute kidney injury) ([place]) [N17.9] 10/25/2014  . Diarrhea [R19.7] 10/25/2014  . Depression [F32.9]   . Anxiety [F41.9]   . 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 Essential hypertension, benign [I10] 09/24/2013  . Scalp abscess [L02.811] 09/24/2013  . Nausea and vomiting [R11.2] 09/24/2013  . Alcohol abuse with alcohol-induced mood disorder ([place]) [F10.14] 07/24/2013  . Abscess of nose [J34.0] 06/02/2013  . Cellulitis [L03.90] 06/02/2013  . Alcohol dependence ([place]) [F10.20] 08/25/2012  . Hypertension [I10]   . Peripheral neuropathy ([place]) [G62.9]   . 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 Opioid dependence [facility]) [F11.20] 08/15/2012   History of Present Illness:PER ED HPI- Bethany Barton is a 45 y.o. female brought in by EMS, with h/o seizure and polysubstance abuse, who presents to the Emergency Department with altered mental status beginning around 2.5 hours ago. Per her neighbors, pt was found unresponsive laying on her sofa around 2.5 hours ago; pt's neighbors went into pt's home and saw her on the couch and called EMS. Per her neighbors, no one could get in contact with pt all day. Pt's neighbors did not notice any alcohol around pt upon entering her home. Per her  neighbors, the last time anyone communicated with pt was around 4 hours ago when she opened her door to speak with some other neighbors. Per her neighbors, pt did not leave a suicide note. Per her neighbors, pt denies pain or any other symptoms.  On Evaluation: Bethany Barton is awake, alert and oriented X4. Seen resting on the obsv unit. Denies suicidal or homicidal ideation during this assessment. Denies auditory or visual hallucination and does not appear to be responding to internal stimuli. Patient reports a recent bypass surgery and has been experiencing pain. Patient reports she is prescribed Cymbalta by her Psychologist, sport and exercise Provider states" its for neuro pathy pain."  Patient reports" I felt like I had a seizure and I  passed out." Patient states " I don't have a problem and I don't need help." Patient denies depression or depressive symptoms. Reports good appetite and is resting well.  Support, encouragement and reassurance was provided.   Associated Signs/Symptoms: Depression Symptoms:  fatigue, anxiety, weight loss, (Hypo) Manic Symptoms:  Impulsivity, Anxiety Symptoms:  Excessive Worry, Psychotic Symptoms:  Hallucinations: None PTSD Symptoms: NA Total Time spent with patient: 30 minutes  Past Psychiatric History: See Above  Is the patient at risk to self? No.  Has the patient been a risk to self in the past 6 months? No.  Has the patient been a risk to self within the distant past? No.  Is the patient a risk to others? No.  Has the patient been a risk to others in the past 6 months? No.  Has the patient been a risk to others within the distant past? No.   Prior Inpatient  Therapy:   Prior Outpatient Therapy:    Alcohol Screening: 1. How often do you have a drink containing alcohol?: Monthly or less Brief Intervention: Yes Substance Abuse History in the last 12 months:  Yes.   Consequences of Substance Abuse: Blackouts:  with past seizure history Withdrawal Symptoms:    Headaches Previous Psychotropic Medications: YES Psychological Evaluations: NO Past Medical History:  Past Medical History  Diagnosis Date  . Hypertension   . Peripheral neuropathy ([place])   . Facial tic   . Diabetes mellitus without complication ([place])   . Polysubstance abuse     etoh, opiates  . Migraines   . Depression   . Anxiety   . Osteoarthritis   . Neuropathic pain of both feet ([facility])     chronic  . Cellulitis     Right foot  . Seizure ([place])   . Ileus [facility])     hospitalized in November 2016  . Chronic foot pain   . Chronic back pain   . Adrenal insufficiency ([place])   . Fibromyalgia     Past Surgical History  Procedure Laterality Date  . 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 Abdominal hysterectomy  2013  . Ankle fusion Right 1991  . Appendectomy  1987  . Cesarean section  1997  . Abdominoplasty  2013  . Cholecystectomy    . Incision and drainage abscess N/A 09/25/2013    Procedure: INCISION AND DRAINAGE ABSCESS;  Surgeon: Gwenyth Ober, MD;  Location: [facility];  Service: General;  Laterality: N/A;  . Cholecystectomy    . Esophagogastroduodenoscopy (egd) with propofol N/A 11/03/2014    BCW:UGQBV gastric pouch  . Biopsy N/A 11/03/2014    Procedure: BIOPSY;  Surgeon: Rogene Houston, MD;  Location: [facility];  Service: Endoscopy;  Laterality: N/A;   Family History:  Family History  Problem Relation Age of Onset  . Bipolar disorder Sister   . Drug abuse Sister   . Schizophrenia Son   . Bipolar disorder Father   . Alcohol abuse Father   . Drug abuse Mother   . Alcohol abuse Mother   . Colon cancer Neg Hx    Family Psychiatric History: See Above Tobacco Screening: @FLOW [phone])::1)@ Social History:  History  Alcohol Use  . Yes    Comment: Denies alcohol since June 2015.     History  Drug Use  . 7.00 per week  . Special: Oxycodone    Comment: 2012    Additional Social History:    Specify valuables returned: Production manager, Bra                       Allergies:   Allergies  Allergen Reactions  . Doxycycline Diarrhea and Nausea And Vomiting  . Keflex [Cephalexin] Diarrhea and Nausea And Vomiting  . Butrans [Buprenorphine] Other (See Comments)    Serotonin syndrome  . Gabapentin Other (See Comments)  . Iodine Other (See Comments)    Blisters  . Lyrica [Pregabalin]   . Neomycin Other (See Comments)    Blisters   . Nsaids Other (See Comments)    Due to gastric bypass.  [name] [Promethazine Hcl] Other (See Comments)    Altered Mental Status  . Sulfa Antibiotics Nausea And Vomiting  . Hibiclens [Chlorhexidine Gluconate] Rash   Lab Results:  Results for orders placed or performed during the hospital encounter of 07/26/15 (from the past 48 hour(s))  Urine rapid drug screen (  hosp performed)     Status: Abnormal   Collection Time: 07/26/15  9:03 PM  Result Value Ref Range   Opiates POSITIVE (A) NONE DETECTED   Cocaine NONE DETECTED NONE DETECTED   Benzodiazepines NONE DETECTED NONE DETECTED   Amphetamines NONE DETECTED NONE DETECTED   Tetrahydrocannabinol NONE DETECTED NONE DETECTED   Barbiturates NONE DETECTED NONE DETECTED    Comment:        DRUG SCREEN FOR MEDICAL PURPOSES ONLY.  IF CONFIRMATION IS NEEDED FOR ANY PURPOSE, NOTIFY LAB WITHIN 5 DAYS.        LOWEST DETECTABLE LIMITS FOR URINE DRUG SCREEN Drug Class       Cutoff (ng/mL) Amphetamine      1000 Barbiturate      200 Benzodiazepine   888 Tricyclics       280 Opiates          300 Cocaine          300 THC              50   Urinalysis, Routine w reflex microscopic     Status: Abnormal   Collection Time: 07/26/15  9:03 PM  Result Value Ref Range   Color, Urine YELLOW YELLOW   APPearance CLEAR CLEAR   Specific Gravity, Urine 1.020 1.005 - 1.030   pH 6.0 5.0 - 8.0   Glucose, UA NEGATIVE NEGATIVE mg/dL   Hgb urine dipstick TRACE (A) NEGATIVE   Bilirubin Urine NEGATIVE NEGATIVE   Ketones, ur NEGATIVE NEGATIVE mg/dL   Protein, ur  NEGATIVE NEGATIVE mg/dL   Nitrite POSITIVE (A) NEGATIVE   Leukocytes, UA NEGATIVE NEGATIVE  Urine microscopic-add on     Status: Abnormal   Collection Time: 07/26/15  9:03 PM  Result Value Ref Range   Squamous Epithelial / LPF 0-5 (A) NONE SEEN   WBC, UA 0-5 0 - 5 WBC/hpf   RBC / HPF 0-5 0 - 5 RBC/hpf   Bacteria, UA MANY (A) NONE SEEN   Urine-Other MUCOUS PRESENT   Acetaminophen level     Status: Abnormal   Collection Time: 07/26/15  9:06 PM  Result Value Ref Range   Acetaminophen (Tylenol), Serum <10 (L) 10 - 30 ug/mL    Comment:        THERAPEUTIC CONCENTRATIONS VARY SIGNIFICANTLY. A RANGE OF 10-30 ug/mL MAY BE AN EFFECTIVE CONCENTRATION FOR MANY PATIENTS. HOWEVER, SOME ARE BEST TREATED AT CONCENTRATIONS OUTSIDE THIS RANGE. ACETAMINOPHEN CONCENTRATIONS >150 ug/mL AT 4 HOURS AFTER INGESTION AND >50 ug/mL AT 12 HOURS AFTER INGESTION ARE OFTEN ASSOCIATED WITH TOXIC REACTIONS.   Comprehensive metabolic panel     Status: Abnormal   Collection Time: 07/26/15  9:06 PM  Result Value Ref Range   Sodium 143 135 - 145 mmol/L   Potassium 3.9 3.5 - 5.1 mmol/L   Chloride 110 101 - 111 mmol/L   CO2 23 22 - 32 mmol/L   Glucose, Bld 96 65 - 99 mg/dL   BUN 12 6 - 20 mg/dL   Creatinine, Ser 0.53 0.44 - 1.00 mg/dL   Calcium 8.1 (L) 8.9 - 10.3 mg/dL   Total Protein 5.7 (L) 6.5 - 8.1 g/dL   Albumin 3.0 (L) 3.5 - 5.0 g/dL   AST 38 15 - 41 U/L   ALT 34 14 - 54 U/L   Alkaline Phosphatase 85 38 - 126 U/L   Total Bilirubin 0.2 (L) 0.3 - 1.2 mg/dL   GFR calc non Af Amer >60 >60 mL/min   GFR calc  Af Amer >60 >60 mL/min    Comment: (NOTE) The eGFR has been calculated using the CKD EPI equation. This calculation has not been validated in all clinical situations. eGFR's persistently <60 mL/min signify possible Chronic Kidney Disease.    Anion gap 10 5 - 15  Ethanol     Status: Abnormal   Collection Time: 07/26/15  9:06 PM  Result Value Ref Range   Alcohol, Ethyl (B) 340 (HH) <5 mg/dL     Comment:        LOWEST DETECTABLE LIMIT FOR SERUM ALCOHOL IS 5 mg/dL FOR MEDICAL PURPOSES ONLY CRITICAL RESULT CALLED TO, READ BACK BY AND VERIFIED WITH: TUTTLE,A. AT 2229 ON 07/26/2015 BY AGUNDIZ,E.   Salicylate level     Status: None   Collection Time: 07/26/15  9:06 PM  Result Value Ref Range   Salicylate Lvl <0.3 2.8 - 30.0 mg/dL  Troponin I     Status: None   Collection Time: 07/26/15  9:06 PM  Result Value Ref Range   Troponin I <0.03 <0.031 ng/mL    Comment:        NO INDICATION OF MYOCARDIAL INJURY.   Lactic acid, plasma     Status: Abnormal   Collection Time: 07/26/15  9:06 PM  Result Value Ref Range   Lactic Acid, Venous 2.8 (HH) 0.5 - 2.0 mmol/L    Comment: CRITICAL RESULT CALLED TO, READ BACK BY AND VERIFIED WITH: TUTTLE,A. AT 2220 ON 07/26/2015 BY AGUNDIZ,E.   CBC with Differential     Status: None   Collection Time: 07/26/15  9:06 PM  Result Value Ref Range   WBC 6.3 4.0 - 10.5 K/uL   RBC 4.49 3.87 - 5.11 MIL/uL   Hemoglobin 13.0 12.0 - 15.0 g/dL   HCT 39.3 36.0 - 46.0 %   MCV 87.5 78.0 - 100.0 fL   MCH 29.0 26.0 - 34.0 pg   MCHC 33.1 30.0 - 36.0 g/dL   RDW 12.5 11.5 - 15.5 %   Platelets 267 150 - 400 K/uL   Neutrophils Relative % 43 %   Neutro Abs 2.7 1.7 - 7.7 K/uL   Lymphocytes Relative 47 %   Lymphs Abs 3.0 0.7 - 4.0 K/uL   Monocytes Relative 7 %   Monocytes Absolute 0.5 0.1 - 1.0 K/uL   Eosinophils Relative 2 %   Eosinophils Absolute 0.2 0.0 - 0.7 K/uL   Basophils Relative 1 %   Basophils Absolute 0.0 0.0 - 0.1 K/uL  Protime-INR     Status: None   Collection Time: 07/26/15  9:06 PM  Result Value Ref Range   Prothrombin Time 13.0 11.6 - 15.2 seconds   INR 0.96 0.00 - 1.49  TSH     Status: None   Collection Time: 07/26/15  9:19 PM  Result Value Ref Range   TSH 1.441 0.350 - 4.500 uIU/mL  Lactic acid, plasma     Status: Abnormal   Collection Time: 07/26/15 11:53 PM  Result Value Ref Range   Lactic Acid, Venous 2.8 (HH) 0.5 - 2.0 mmol/L     Comment: CRITICAL RESULT CALLED TO, READ BACK BY AND VERIFIED WITH: WILSON,S. AT 0030 ON 07/27/2015 BY AGUNDIZ,E.   CBC     Status: None   Collection Time: 07/27/15  4:49 AM  Result Value Ref Range   WBC 4.9 4.0 - 10.5 K/uL   RBC 4.50 3.87 - 5.11 MIL/uL   Hemoglobin 12.8 12.0 - 15.0 g/dL   HCT 38.8 36.0 - 46.0 %  MCV 86.2 78.0 - 100.0 fL   MCH 28.4 26.0 - 34.0 pg   MCHC 33.0 30.0 - 36.0 g/dL   RDW 12.5 11.5 - 15.5 %   Platelets 232 150 - 400 K/uL  Creatinine, serum     Status: None   Collection Time: 07/27/15  4:49 AM  Result Value Ref Range   Creatinine, Ser 0.46 0.44 - 1.00 mg/dL   GFR calc non Af Amer >60 >60 mL/min   GFR calc Af Amer >60 >60 mL/min    Comment: (NOTE) The eGFR has been calculated using the CKD EPI equation. This calculation has not been validated in all clinical situations. eGFR's persistently <60 mL/min signify possible Chronic Kidney Disease.   MRSA PCR Screening     Status: Abnormal   Collection Time: 07/27/15 11:47 AM  Result Value Ref Range   MRSA by PCR INVALID RESULTS, SPECIMEN SENT FOR CULTURE (A) NEGATIVE    Comment: BULLINS L. AT 3716 ON [id number] BY THOMPSON S.        The GeneXpert MRSA Assay (FDA approved for NASAL specimens only), is one component of a comprehensive MRSA colonization surveillance program. It is not intended to diagnose MRSA infection nor to guide or monitor treatment for MRSA infections.     Blood Alcohol level:  Lab Results  Component Value Date   ETH 340* 07/26/2015   ETH 235* 81/03/7508    Metabolic Disorder Labs:  Lab Results  Component Value Date   HGBA1C 5.6 02/05/2015   Lab Results  Component Value Date   PROLACTIN 1.6 02/05/2015   Lab Results  Component Value Date   CHOL 167 11/22/2014   TRIG 133 11/22/2014   HDL 47 11/22/2014   LDLCALC 93 11/22/2014    Current Medications: Current Facility-Administered Medications  Medication Dose Route Frequency Provider Last Rate Last Dose  . cloNIDine  (CATAPRES) tablet 0.2 mg  0.2 mg Oral QID Hampton Abbot, MD   0.2 mg at 07/28/15 2585  . hydrOXYzine (ATARAX/VISTARIL) tablet 25 mg  25 mg Oral Q6H PRN Hampton Abbot, MD   25 mg at 07/27/15 2250   PTA Medications: Prescriptions prior to admission  Medication Sig Dispense Refill Last Dose  . Alpha-Lipoic Acid 300 MG CAPS Take 1 capsule by mouth 2 (two) times daily.    07/26/2015 at Unknown time  . Armodafinil 200 MG TABS TAKE ONE TABLET BY MOUTH ONCE DAILY. 30 tablet 5 07/25/2015 at Unknown time  . Aspirin-Acetaminophen-Caffeine (GOODYS EXTRA STRENGTH) 500-325-65 MG PACK Take 1 packet by mouth daily as needed. For pain   07/26/2015 at Unknown time  . Biotin 10 MG TABS Take 1 tablet by mouth daily.    07/26/2015 at Unknown time  . Cholecalciferol (VITAMIN D) 2000 units CAPS Take 1 capsule by mouth daily.   07/26/2015 at Unknown time  . clonazePAM (KLONOPIN) 0.5 MG tablet Take 0.5 mg by mouth 2 (two) times daily as needed for anxiety.    Past Month at Unknown time  . cloNIDine (CATAPRES) 0.2 MG tablet Take 0.2 mg by mouth 4 (four) times daily.   07/26/2015 at Unknown time  . cloNIDine HCl (KAPVAY) 0.1 MG TB12 ER tablet Take 0.1 mg by mouth 2 (two) times daily.   10 07/26/2015 at Unknown time  . DULoxetine (CYMBALTA) 60 MG capsule TAKE (1) CAPSULE BY MOUTH ONCE DAILY. 30 capsule 11 07/26/2015 at Unknown time  . eletriptan (RELPAX) 40 MG tablet Take 1 tablet (40 mg total) by mouth as needed for migraine  or headache. May repeat in 2 hours if headache persists or recurs. 8 tablet 11 Past Month at Unknown time  . ferrous sulfate 325 (65 FE) MG tablet Take 325 mg by mouth daily with breakfast.   07/26/2015 at Unknown time  . hydrocortisone (CORTEF) 10 MG tablet Take 1 tablet (10 mg total) by mouth 2 (two) times daily. 60 tablet 5 07/26/2015 at Unknown time  . hydrOXYzine (ATARAX/VISTARIL) 25 MG tablet Take 25 mg by mouth every 8 (eight) hours as needed for anxiety.    Past Month at Unknown time  . imipramine  (TOFRANIL) 50 MG tablet Take 50 mg by mouth 2 (two) times daily.   0 07/26/2015 at Unknown time  . L-Methylfolate-B6-B12 (FOLTANX) 3-35-2 MG TABS Take 1 tablet by mouth 2 (two) times daily.   07/26/2015 at Unknown time  . losartan (COZAAR) 100 MG tablet Take 100 mg by mouth daily.   07/26/2015 at Unknown time  . MAGNESIUM PO Take 1 tablet by mouth daily.    07/26/2015 at Unknown time  . Multiple Vitamin (MULTIVITAMIN WITH MINERALS) TABS tablet Take 1 tablet by mouth daily.   07/26/2015 at Unknown time  . Naloxone HCl (EVZIO) 0.4 MG/0.4ML SOAJ Inject as directed as needed.   unknown  . Omega-3 Fatty Acids (FISH OIL PO) Take 1 capsule by mouth daily.    07/26/2015 at Unknown time  . ondansetron (ZOFRAN) 8 MG tablet TAKE 1 TABLET EVERY 8 HOURS AS NEEDED FOR NAUSEA OR VOMITING. 20 tablet 1 07/26/2015 at Unknown time  . oxymorphone (OPANA) 10 MG tablet Take 10 mg by mouth every 4 (four) hours as needed for pain.   0 07/26/2015 at Unknown time  . Potassium 99 MG TABS Take 1 tablet by mouth daily.    07/26/2015 at Unknown time  . pravastatin (PRAVACHOL) 10 MG tablet Take 10 mg by mouth daily.   10 07/26/2015 at Unknown time  . SUMAtriptan 6 MG/0.5ML SOAJ Inject 6 mg into the skin as needed. (Patient taking differently: Inject 6 mg into the skin daily as needed (for migraine pain). ) 6 cartridge 11 Past Month at Unknown time  . topiramate (TOPAMAX) 100 MG tablet Take 100 mg by mouth 2 (two) times daily.   07/26/2015 at Unknown time  . Topiramate ER (TROKENDI XR) 200 MG CP24 Take 200 mg by mouth at bedtime. 30 capsule 11 07/26/2015 at Unknown time    Musculoskeletal: Strength & Muscle Tone: within normal limits Gait & Station: normal Patient leans: N/A  Psychiatric Specialty Exam: Physical Exam  Nursing note and vitals reviewed. Constitutional: She is oriented to person, place, and time. She appears well-developed.  Musculoskeletal: Normal range of motion.  Neurological: She is alert and oriented to person,  place, and time.  Psychiatric: She has a normal mood and affect. Her behavior is normal.    Review of Systems  Psychiatric/Behavioral: Positive for substance abuse. Negative for suicidal ideas and hallucinations. Depression: stable. Nervous/anxious: stable.   All other systems reviewed and are negative.   Blood pressure 178/92, pulse 67, temperature 98.1 F (36.7 C), temperature source Oral, resp. rate 16, height 5' 3"  (1.6 m), weight 70.308 kg (155 lb), SpO2 100 %.Body mass index is 27.46 kg/(m^2).  General Appearance: Casual  Eye Contact:  Good  Speech:  Clear and Coherent  Volume:  Normal  Mood:  Euthymic  Affect:  Appropriate and Congruent  Thought Process:  Coherent  Orientation:  Full (Time, Place, and Person)  Thought Content:  Hallucinations:  None  Suicidal Thoughts:  No  Homicidal Thoughts:  No  Memory:  Immediate;   Fair Remote;   Fair  Judgement:  Fair  Insight:  Fair  Psychomotor Activity:  Normal  Concentration:  Concentration: Fair  Recall:  Good  Fund of Knowledge:  Fair  Language:  Good  Akathisia:  No  Handed:  Right  AIMS (if indicated):     Assets:  Physical Health Resilience Social Support  ADL's:  Intact  Cognition:  WNL  Sleep:        I agree with current treatment plan on 07/28/2015, Patient seen face-to-face for psychiatric evaluation follow-up, chart reviewed and case discussed with the MD Dwyane Dee. Reviewed the information documented and agree with the treatment plan.   Treatment Plan Summary: Daily contact with patient to assess and evaluate symptoms and progress in treatment  Observation Level/Precautions:  Continuous Observation Laboratory:  CBC Chemistry Profile Folic Acid UDS UA Psychotherapy:  Individual Medications:  Continue Home medications Consultations:  Psychiatry Discharge Concerns:  Safety, stabilization, and risk of access to medication and medication stabilization  Estimated LOS: Less than 48 hours Other:      Derrill [place], NP 5/29/20179:44 AM
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
## 2015-07-28 NOTE — Progress Notes (Signed)
Patient discharged home. DC instructions provided and explained. Medications reviewed. Rx given. All questions answered. Pt stable at discharge. Denies SI, HI, AVH.  

## 2015-07-28 NOTE — Progress Notes (Signed)
This Clinical research associatewriter spoke with the patient to collect collateral information and needed resources.  Patient is currently requesting discharge and outpatient reources.  [place]  Patient was given outpatient resources for [facility] in [place], [place] which she report is walking distance from her home.  Patient denies SI/HI, A/VH, and other self-injurious behaviors.  Patient denies history of inpatient hospitalization mental health but last inpatient treatment for SA 4 years.    Patient reports recent ending of relationship has contributed to symptoms of depression.  Patient reports having family resources and supports to assist with recovery efforts.     Patient will be discharged with outpatient resources.  Patient was given contact information for [facility] [phone].  Bethany Rowanressa Casmira Cramer, MSW, Clare CharonLCSW, LCAS [facility] Triage Specialist [phone]

## 2015-07-30 LAB — MRSA CULTURE

## 2015-08-03 ENCOUNTER — Emergency Department (HOSPITAL_COMMUNITY)
Admission: EM | Admit: 2015-08-03 | Discharge: 2015-08-03 | Disposition: A | Discharge status: Home / Self Care | Payer: BLUE CROSS/BLUE SHIELD | Attending: Emergency Medicine | Admitting: Emergency Medicine

## 2015-08-03 ENCOUNTER — Emergency Department (HOSPITAL_COMMUNITY): Payer: BLUE CROSS/BLUE SHIELD

## 2015-08-03 ENCOUNTER — Encounter (HOSPITAL_COMMUNITY): Payer: Self-pay

## 2015-08-03 DIAGNOSIS — T07XXXA Unspecified multiple injuries, initial encounter: Secondary | ICD-10-CM

## 2015-08-03 DIAGNOSIS — Z7982 Long term (current) use of aspirin: Secondary | ICD-10-CM | POA: Diagnosis not present

## 2015-08-03 DIAGNOSIS — I10 Essential (primary) hypertension: Secondary | ICD-10-CM | POA: Diagnosis not present

## 2015-08-03 DIAGNOSIS — Z791 Long term (current) use of non-steroidal anti-inflammatories (NSAID): Secondary | ICD-10-CM | POA: Diagnosis not present

## 2015-08-03 DIAGNOSIS — F329 Major depressive disorder, single episode, unspecified: Secondary | ICD-10-CM | POA: Diagnosis not present

## 2015-08-03 DIAGNOSIS — M549 Dorsalgia, unspecified: Secondary | ICD-10-CM | POA: Diagnosis not present

## 2015-08-03 DIAGNOSIS — Z79899 Other long term (current) drug therapy: Secondary | ICD-10-CM | POA: Diagnosis not present

## 2015-08-03 DIAGNOSIS — S6991XA Unspecified injury of right wrist, hand and finger(s), initial encounter: Secondary | ICD-10-CM | POA: Diagnosis present

## 2015-08-03 DIAGNOSIS — S62336A Displaced fracture of neck of fifth metacarpal bone, right hand, initial encounter for closed fracture: Secondary | ICD-10-CM | POA: Diagnosis not present

## 2015-08-03 DIAGNOSIS — W01198A Fall on same level from slipping, tripping and stumbling with subsequent striking against other object, initial encounter: Secondary | ICD-10-CM | POA: Insufficient documentation

## 2015-08-03 DIAGNOSIS — Y939 Activity, unspecified: Secondary | ICD-10-CM | POA: Insufficient documentation

## 2015-08-03 DIAGNOSIS — W010XXA Fall on same level from slipping, tripping and stumbling without subsequent striking against object, initial encounter: Secondary | ICD-10-CM

## 2015-08-03 DIAGNOSIS — E119 Type 2 diabetes mellitus without complications: Secondary | ICD-10-CM | POA: Insufficient documentation

## 2015-08-03 DIAGNOSIS — Y999 Unspecified external cause status: Secondary | ICD-10-CM | POA: Insufficient documentation

## 2015-08-03 DIAGNOSIS — Y929 Unspecified place or not applicable: Secondary | ICD-10-CM | POA: Diagnosis not present

## 2015-08-03 DIAGNOSIS — S0031XA Abrasion of nose, initial encounter: Secondary | ICD-10-CM | POA: Diagnosis not present

## 2015-08-03 DIAGNOSIS — S62308A Unspecified fracture of other metacarpal bone, initial encounter for closed fracture: Secondary | ICD-10-CM

## 2015-08-03 DIAGNOSIS — M1990 Unspecified osteoarthritis, unspecified site: Secondary | ICD-10-CM | POA: Diagnosis not present

## 2015-08-03 MED ORDER — ACETAMINOPHEN 500 MG PO TABS
ORAL_TABLET | ORAL | Status: AC
  Filled 2015-08-03: qty 2

## 2015-08-03 MED ORDER — ACETAMINOPHEN 500 MG PO TABS
1000.0000 mg | ORAL_TABLET | Freq: Once | ORAL | Status: AC
  Administered 2015-08-03: 1000 mg via ORAL
  Filled 2015-08-03: qty 2

## 2015-08-03 NOTE — ED Notes (Addendum)
Pt reports she fell today due to peripheral neuropathy and injured right hand, right knee and nose. States she fell on her face. Right hand is swollen. Pt has applied ice to right hand since fall but swelling increasing

## 2015-08-03 NOTE — ED Notes (Signed)
Pt with brisk cap refill to right hand

## 2015-08-03 NOTE — Discharge Instructions (Signed)
1. Medications: alternate naprosyn and tylenol for pain control, usual home medications as needed 2. Treatment: rest, ice, elevate and use brace, drink plenty of fluids, gentle stretching 3. Follow Up: Please followup with orthopedics as directed for discussion of your diagnoses and further evaluation after today's visit; if you do not have a primary care doctor use the resource guide provided to find one; Please return to the ER for worsening symptoms or other concerns

## 2015-08-03 NOTE — ED Provider Notes (Signed)
CSN: [id number]     Arrival date & time 08/03/15  1612 History  By signing my name below, I, Evon Slackerrance Branch, attest that this documentation has been prepared under the direction and in the presence of TXU CorpHannah Ahliya Glatt, PA-C. Electronically Signed: Evon Slackerrance Branch, ED Scribe. 08/03/2015. 4:54 PM.    Chief Complaint  Patient presents with  . Hand Injury    The history is provided by the patient and medical records. No language interpreter was used.   HPI Comments: Bethany Barton is a 45 y.o. female with a Hx of HTN, peripheral neuropathy, NIDDM, depression, chronic back pain who presents to the Emergency Department complaining of a trip and fall onset today 4 hours PTA. Pt states that she fell onto her outstretched right hand and face. Pt presents with right hand pain with associated swelling, nasal bridge abrasion and right knee abrasion. Pt states that she is right hand dominant. She states that the pain in her hand is worse with movement. Pt states she has cleaned her wounds and applied bactrim. Pt denies LOC. Pt denies numbness, tingling, weakness, LOC, syncope, neck pain.  Pt reports low back pain but states this is chronic in nature and she takes Opana for this. Tetanus is UTD.    Past Medical History  Diagnosis Date  . Hypertension   . Peripheral neuropathy (HCC)   . Facial tic   . Diabetes mellitus without complication (HCC)   . Polysubstance abuse     etoh, opiates  . Migraines   . Depression   . Anxiety   . Osteoarthritis   . Neuropathic pain of both feet (HCC)     chronic  . Cellulitis     Right foot  . Seizure (HCC)   . [facility])     hospitalized in November 2016  . Chronic foot pain   . Chronic back pain   . Adrenal insufficiency (HCC)   . Fibromyalgia    Past Surgical History  Procedure Laterality Date  . 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 Abdominal hysterectomy  2013  . Ankle fusion Right 1991  . Appendectomy  1987  . Cesarean  section  1997  . Abdominoplasty  2013  . Cholecystectomy    . Incision and drainage abscess N/A 09/25/2013    Procedure: INCISION AND DRAINAGE ABSCESS;  Surgeon: Cherylynn RidgesJames O [name], MD;  Location: [facility] OR;  Service: General;  Laterality: N/A;  . Cholecystectomy    . Esophagogastroduodenoscopy (egd) with propofol N/A 11/03/2014    UYQ:IHKVQR:small gastric pouch  . Biopsy N/A 11/03/2014    Procedure: BIOPSY;  Surgeon: Malissa HippoNajeeb U Rehman, MD;  Location: [facility] ORS;  Service: Endoscopy;  Laterality: N/A;   Family History  Problem Relation Age of Onset  . Bipolar disorder Sister   . Drug abuse Sister   . Schizophrenia Son   . Bipolar disorder Father   . Alcohol abuse Father   . Drug abuse Mother   . Alcohol abuse Mother   . Colon cancer Neg Hx    Social History  Substance Use Topics  . Smoking status: Never Smoker   . Smokeless tobacco: Never Used  . Alcohol Use: Yes     Comment: Denies alcohol since June 2015.   OB History    No data available     Review of Systems  Constitutional: Negative for fever and chills.  HENT: Negative for dental problem, facial swelling and nosebleeds.   Eyes: Negative for  visual disturbance.  Respiratory: Negative for cough, chest tightness, shortness of breath, wheezing and stridor.   Cardiovascular: Negative for chest pain.  Gastrointestinal: Negative for nausea, vomiting and abdominal pain.  Genitourinary: Negative for dysuria, hematuria and flank pain.  Musculoskeletal: Positive for back pain ( low, chronic, unchanged), joint swelling and arthralgias. Negative for gait problem, neck pain and neck stiffness.  Skin: Positive for wound. Negative for rash.  Neurological: Negative for syncope, weakness, light-headedness, numbness and headaches.  Hematological: Does not bruise/bleed easily.  Psychiatric/Behavioral: The patient is not nervous/anxious.   All other systems reviewed and are negative.     Allergies  Doxycycline; Keflex; Butrans; Gabapentin; Iodine; Lyrica;  Neomycin; Nsaids; Phenergan; Sulfa antibiotics; and Hibiclens  Home Medications   Prior to Admission medications   Medication Sig Start Date End Date Taking? Authorizing Provider  acetaminophen (TYLENOL) 500 MG tablet Take 500 mg by mouth every 6 (six) hours as needed for mild pain.   Yes Historical Provider, MD  Alpha-Lipoic Acid 300 MG CAPS Take 1 capsule by mouth 2 (two) times daily.    Yes Historical Provider, MD  Aspirin-Acetaminophen-Caffeine (GOODYS EXTRA STRENGTH) [phone] MG PACK Take 1 packet by mouth as needed (Pain).   Yes Historical Provider, MD  Biotin 10 MG TABS Take 1 tablet by mouth daily.    Yes Historical Provider, MD  Cholecalciferol (VITAMIN D) 2000 units CAPS Take 1 capsule by mouth daily.   Yes Historical Provider, MD  clonazePAM (KLONOPIN) 0.5 MG tablet Take 0.5 mg by mouth 2 (two) times daily as needed for anxiety.  09/13/14  Yes Historical Provider, MD  DULoxetine (CYMBALTA) 60 MG capsule TAKE (1) CAPSULE BY MOUTH ONCE DAILY. 12/08/14  Yes Levert Feinstein, MD  ferrous sulfate 325 (65 FE) MG tablet Take 325 mg by mouth daily with breakfast.   Yes Historical Provider, MD  hydrocortisone (CORTEF) 10 MG tablet Take 1 tablet (10 mg total) by mouth 2 (two) times daily. 04/15/15  Yes Carlus Pavlov, MD  hydrOXYzine (ATARAX/VISTARIL) 25 MG tablet Take 25 mg by mouth every 8 (eight) hours as needed for anxiety.    Yes Historical Provider, MD  ibuprofen (ADVIL,MOTRIN) 200 MG tablet Take 200 mg by mouth every 6 (six) hours as needed for mild pain.   Yes Historical Provider, MD  imipramine (TOFRANIL) 50 MG tablet Take 50 mg by mouth 2 (two) times daily.  07/14/15  Yes Historical Provider, MD  lidocaine (LMX) 4 % cream Apply 1 application topically as needed (Pain).   Yes Historical Provider, MD  losartan (COZAAR) 100 MG tablet Take 100 mg by mouth daily.   Yes Historical Provider, MD  MAGNESIUM PO Take 1 tablet by mouth daily.    Yes Historical Provider, MD  Multiple Vitamin  (MULTIVITAMIN WITH MINERALS) TABS tablet Take 1 tablet by mouth daily.   Yes Historical Provider, MD  mupirocin cream (BACTROBAN) 2 % Apply 1 application topically as needed (Uses on open sores).   Yes Historical Provider, MD  Omega-3 Fatty Acids (FISH OIL PO) Take 1 capsule by mouth daily.    Yes Historical Provider, MD  ondansetron (ZOFRAN) 8 MG tablet TAKE 1 TABLET EVERY 8 HOURS AS NEEDED FOR NAUSEA OR VOMITING. 07/03/15  Yes Nira Retort, NP  oxymorphone (OPANA) 10 MG tablet Take 10 mg by mouth every 6 (six) hours as needed. 07/31/15  Yes Historical Provider, MD  Potassium 99 MG TABS Take 1 tablet by mouth daily.    Yes Historical Provider, MD  pravastatin (PRAVACHOL)  10 MG tablet Take 10 mg by mouth daily.  03/22/15  Yes Historical Provider, MD  topiramate (TOPAMAX) 100 MG tablet Take 100 mg by mouth 2 (two) times daily.   Yes Historical Provider, MD  Topiramate ER (TROKENDI XR) 200 MG CP24 Take 200 mg by mouth at bedtime. 12/25/14  Yes Levert Feinstein, MD  Armodafinil 200 MG TABS TAKE ONE TABLET BY MOUTH ONCE DAILY. 07/03/15   Levert Feinstein, MD  ciprofloxacin (CIPRO) 500 MG tablet Take 1 tablet (500 mg total) by mouth 2 (two) times daily. Patient not taking: Reported on 08/03/2015 07/28/15   Oneta Rack, NP  eletriptan (RELPAX) 40 MG tablet Take 1 tablet (40 mg total) by mouth as needed for migraine or headache. May repeat in 2 hours if headache persists or recurs. 04/17/15   Levert Feinstein, MD  Naloxone HCl (EVZIO) 0.4 MG/0.4ML SOAJ Inject as directed as needed.    Historical Provider, MD  SUMAtriptan 6 MG/0.5ML SOAJ Inject 6 mg into the skin as needed. Patient taking differently: Inject 6 mg into the skin daily as needed (for migraine pain).  04/17/15   Levert Feinstein, MD   BP 122/84 mmHg  Pulse 79  Temp(Src) 98.1 F (36.7 C) (Oral)  Resp 18  Ht 5\' 3"  (1.6 m)  Wt 68.947 kg  BMI 26.93 kg/m2  SpO2 100%   Physical Exam  Constitutional: She is oriented to person, place, and time. She appears well-developed and  well-nourished. No distress.  HENT:  Head: Normocephalic.    Mouth/Throat: Uvula is midline, oropharynx is clear and moist and mucous membranes are normal.  Small abrasion to the septum opf the nose without swelling or echymossis   Eyes: Conjunctivae and EOM are normal. Pupils are equal, round, and reactive to light.  No tenderness to palpaption along the orbit or on the globe   Neck: Normal range of motion. No spinous process tenderness and no muscular tenderness present. No rigidity. Normal range of motion present.  Full ROM without pain No midline cervical tenderness No crepitus, deformity or step-offs No paraspinal tenderness  Cardiovascular: Normal rate, regular rhythm, normal heart sounds and intact distal pulses.   Pulses:      Radial pulses are 2+ on the right side, and 2+ on the left side.       Dorsalis pedis pulses are 2+ on the right side, and 2+ on the left side.       Posterior tibial pulses are 2+ on the right side, and 2+ on the left side.  Pulmonary/Chest: Effort normal and breath sounds normal. No accessory muscle usage. No respiratory distress. She has no decreased breath sounds. She has no wheezes. She has no rhonchi. She has no rales. She exhibits no tenderness and no bony tenderness.  No flail segment, crepitus or deformity Equal chest expansion  Abdominal: Soft. Normal appearance and bowel sounds are normal. There is no tenderness. There is no rigidity, no guarding and no CVA tenderness.  Abd soft and nontender  Musculoskeletal:       Thoracic back: She exhibits normal range of motion.       Lumbar back: She exhibits normal range of motion.  Full range of motion of the T-spine and L-spine No tenderness to palpation of the spinous processes of the T-spine or L-spine No crepitus, deformity or step-offs Mild tenderness to palpation of the paraspinous muscles of the L-spine - baseline and unchanged Full ROM of the right shoulder and elbow;  Decreased ROM of wrist  and all finger of right hand Significant swelling and ecchymosis to the dorsum and medial aspect to right hand with tenderness to palpation  FROM of the right knee with abrasion over the patella; no abnormal patellar movement  Lymphadenopathy:    She has no cervical adenopathy.  Neurological: She is alert and oriented to person, place, and time. No cranial nerve deficit. GCS eye subscore is 4. GCS verbal subscore is 5. GCS motor subscore is 6.  Reflex Scores:      Bicep reflexes are 2+ on the right side and 2+ on the left side.      Brachioradialis reflexes are 2+ on the right side and 2+ on the left side.      Patellar reflexes are 2+ on the right side and 2+ on the left side.      Achilles reflexes are 2+ on the right side and 2+ on the left side. Speech is clear and goal oriented, follows commands Normal 5/5 strength in upper and lower extremities bilaterally including dorsiflexion and plantar flexion, strong and equal grip strength Sensation normal to light and sharp touch except in the ulnar distrubution of right hand with subjectively decreased sensation  Moves extremities without ataxia, coordination intact Normal gait and balance No Clonus  Skin: Skin is warm and dry. No rash noted. She is not diaphoretic. No erythema.  Abrasion to the right knee  Psychiatric: She has a normal mood and affect.  Nursing note and vitals reviewed.   ED Course  Procedures (including critical care time) DIAGNOSTIC STUDIES: Oxygen Saturation is 100% on RA, normal by my interpretation.    COORDINATION OF CARE: 4:39 PM-Discussed treatment plan with pt at bedside and pt agreed to plan.     Imaging Review Dg Hand Complete Right  08/03/2015  CLINICAL DATA:  Pain following fall EXAM: RIGHT HAND - COMPLETE 3+ VIEW COMPARISON:  None. FINDINGS: Frontal, oblique, and lateral views were obtained. There is soft tissue swelling dorsally and medially. There is a fracture of the proximal metaphysis of the fifth  metacarpal with alignment near anatomic. No other fracture. No dislocation. Joint spaces appear unremarkable. IMPRESSION: Fracture proximal metaphysis of the fifth metacarpal with overall alignment near anatomic. No other fracture. No dislocation. Soft tissue swelling medially is noted. Electronically Signed   By: Bretta Bang III M.D.   On: 08/03/2015 17:02   I have personally reviewed and evaluated these images as part of my medical decision-making.    MDM   Final diagnoses:  Closed fracture of 5th metacarpal, initial encounter  Fall from slip, trip, or stumble, initial encounter  Abrasions of multiple sites    LEVY WELLMAN presents after fall with right hand injury.  Images show a fracture of the proximal metaphysis of the fifth metacarpal with near anatomic alignment.  Tetanus is UTD.  She is ambulatory without difficulty.   Pain managed in the department. Patient given ulnar gutter splint and sling while in ED, conservative therapy recommended and discussed.   5:59 PM Discussed pt with Dr. Ronie Spies RN who will alert him that she will be following up in the office.  Patient will be dc home & is agreeable with above plan. I have also discussed reasons to return immediately to the ER.  Patient expresses understanding and agrees with plan.  I personally performed the services described in this documentation, which was scribed in my presence. The recorded information has been reviewed and is accurate.   Dahlia Client Tao Satz, PA-C 08/03/15 1802  Bethany Memos, MD 08/03/15 2255

## 2015-08-27 ENCOUNTER — Emergency Department (HOSPITAL_COMMUNITY): Payer: BLUE CROSS/BLUE SHIELD

## 2015-08-27 ENCOUNTER — Encounter (HOSPITAL_COMMUNITY): Payer: Self-pay | Admitting: Emergency Medicine

## 2015-08-27 ENCOUNTER — Emergency Department (HOSPITAL_COMMUNITY)
Admission: EM | Admit: 2015-08-27 | Discharge: 2015-08-27 | Disposition: A | Discharge status: Home / Self Care | Payer: BLUE CROSS/BLUE SHIELD | Attending: Emergency Medicine | Admitting: Emergency Medicine

## 2015-08-27 DIAGNOSIS — I10 Essential (primary) hypertension: Secondary | ICD-10-CM | POA: Diagnosis not present

## 2015-08-27 DIAGNOSIS — G40909 Epilepsy, unspecified, not intractable, without status epilepticus: Secondary | ICD-10-CM | POA: Diagnosis not present

## 2015-08-27 DIAGNOSIS — R079 Chest pain, unspecified: Secondary | ICD-10-CM | POA: Diagnosis present

## 2015-08-27 DIAGNOSIS — Z7982 Long term (current) use of aspirin: Secondary | ICD-10-CM | POA: Diagnosis not present

## 2015-08-27 DIAGNOSIS — Z79899 Other long term (current) drug therapy: Secondary | ICD-10-CM | POA: Insufficient documentation

## 2015-08-27 DIAGNOSIS — E1140 Type 2 diabetes mellitus with diabetic neuropathy, unspecified: Secondary | ICD-10-CM | POA: Insufficient documentation

## 2015-08-27 LAB — I-STAT TROPONIN, ED
TROPONIN I, POC: 0 ng/mL (ref 0.00–0.08)
Troponin i, poc: 0.01 ng/mL (ref 0.00–0.08)

## 2015-08-27 LAB — BASIC METABOLIC PANEL
ANION GAP: 11 (ref 5–15)
BUN: 11 mg/dL (ref 6–20)
CALCIUM: 9.6 mg/dL (ref 8.9–10.3)
CO2: 21 mmol/L — AB (ref 22–32)
Chloride: 108 mmol/L (ref 101–111)
Creatinine, Ser: 0.65 mg/dL (ref 0.44–1.00)
GFR calc non Af Amer: >60 mL/min (ref >60)
Glucose, Bld: 96 mg/dL (ref 65–99)
Potassium: 3.3 mmol/L — ABNORMAL LOW (ref 3.5–5.1)
Sodium: 140 mmol/L (ref 135–145)

## 2015-08-27 LAB — CBC
HCT: 44.6 % (ref 36.0–46.0)
HEMOGLOBIN: 15.5 g/dL — AB (ref 12.0–15.0)
MCH: 29.6 pg (ref 26.0–34.0)
MCHC: 34.8 g/dL (ref 30.0–36.0)
MCV: 85.1 fL (ref 78.0–100.0)
Platelets: 273 10*3/uL (ref 150–400)
RBC: 5.24 MIL/uL — AB (ref 3.87–5.11)
RDW: 14.3 % (ref 11.5–15.5)
WBC: 7.6 10*3/uL (ref 4.0–10.5)

## 2015-08-27 MED ORDER — METOPROLOL TARTRATE 25 MG PO TABS
25.0000 mg | ORAL_TABLET | Freq: Once | ORAL | Status: AC
  Administered 2015-08-27: 25 mg via ORAL
  Filled 2015-08-27: qty 1

## 2015-08-27 MED ORDER — ONDANSETRON HCL 4 MG/2ML IJ SOLN
4.0000 mg | Freq: Once | INTRAMUSCULAR | Status: AC
Start: 2015-08-27 — End: 2015-08-27
  Administered 2015-08-27: 4 mg via INTRAVENOUS
  Filled 2015-08-27: qty 2

## 2015-08-27 MED ORDER — IOPAMIDOL (ISOVUE-370) INJECTION 76%
INTRAVENOUS | Status: AC
  Administered 2015-08-27: 80 mL
  Filled 2015-08-27: qty 50

## 2015-08-27 MED ORDER — SODIUM CHLORIDE 0.9 % IV BOLUS (SEPSIS)
1000.0000 mL | Freq: Once | INTRAVENOUS | Status: AC
  Administered 2015-08-27: 1000 mL via INTRAVENOUS

## 2015-08-27 MED ORDER — CLONIDINE HCL 0.1 MG PO TABS
0.1000 mg | ORAL_TABLET | Freq: Once | ORAL | Status: AC
  Administered 2015-08-27: 0.1 mg via ORAL
  Filled 2015-08-27: qty 1

## 2015-08-27 MED ORDER — MORPHINE SULFATE (PF) 4 MG/ML IV SOLN
4.0000 mg | Freq: Once | INTRAVENOUS | Status: AC
  Administered 2015-08-27: 4 mg via INTRAVENOUS
  Filled 2015-08-27: qty 1

## 2015-08-27 NOTE — ED Provider Notes (Signed)
CSN: [id number]     Arrival date & time 08/27/15  1241 History  By signing my name below, I, Marisue Humble, attest that this documentation has been prepared under the direction and in the presence of Linwood Dibbles, MD . Electronically Signed: Marisue Humble, Scribe. 08/27/2015. 4:55 PM.   Chief Complaint  Patient presents with  . Chest Pain    The history is provided by the patient. No language interpreter was used.   HPI Comments:  Bethany Barton is a 45 y.o. female with PMHx of HTN, DM, migraines, osteoarthritis, seizure, ileus, gastric bypass and fibromyalgia who presents to the Emergency Department complaining of sharp, central chest pain radiating to neck, arm and back beginning this morning. Pain was constant for 15-20 minutes then became intermittent. Pt reports associated headache, diaphoresis, shortness of breath, numbness and nausea. No alleviating or exacerbating factors noted. Pt also notes new bruising on legs and recent weight loss. She reports having anxiety attacks previously without chest pain. Denies taking new medications recently, h/o MI, or h/o blood clots.  Past Medical History  Diagnosis Date  . Hypertension   . Peripheral neuropathy (HCC)   . Facial tic   . Diabetes mellitus without complication (HCC)   . Polysubstance abuse     etoh, opiates  . Migraines   . Depression   . Anxiety   . Osteoarthritis   . Neuropathic pain of both feet (HCC)     chronic  . Cellulitis     Right foot  . Seizure (HCC)   . [facility])     hospitalized in November 2016  . Chronic foot pain   . Chronic back pain   . Adrenal insufficiency (HCC)   . Fibromyalgia    Past Surgical History  Procedure Laterality Date  . 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 Abdominal hysterectomy  2013  . Ankle fusion Right 1991  . Appendectomy  1987  . Cesarean section  1997  . Abdominoplasty  2013  . Cholecystectomy    . Incision and drainage abscess N/A 09/25/2013     Procedure: INCISION AND DRAINAGE ABSCESS;  Surgeon: Cherylynn Ridges, MD;  Location: [facility] OR;  Service: General;  Laterality: N/A;  . Cholecystectomy    . Esophagogastroduodenoscopy (egd) with propofol N/A 11/03/2014    XLK:GMWNU gastric pouch  . Biopsy N/A 11/03/2014    Procedure: BIOPSY;  Surgeon: Malissa Hippo, MD;  Location: [facility] ORS;  Service: Endoscopy;  Laterality: N/A;   Family History  Problem Relation Age of Onset  . Bipolar disorder Sister   . Drug abuse Sister   . Schizophrenia Son   . Bipolar disorder Father   . Alcohol abuse Father   . Drug abuse Mother   . Alcohol abuse Mother   . Colon cancer Neg Hx    Social History  Substance Use Topics  . Smoking status: Never Smoker   . Smokeless tobacco: Never Used  . Alcohol Use: Yes     Comment: Denies alcohol since June 2015.   OB History    No data available     Review of Systems  Constitutional: Positive for diaphoresis.  Respiratory: Positive for shortness of breath.   Cardiovascular: Positive for chest pain.  Gastrointestinal: Positive for nausea.  Musculoskeletal: Positive for myalgias, arthralgias and neck pain.  Neurological: Positive for numbness and headaches.  All other systems reviewed and are negative.   Allergies  Doxycycline; Keflex; Butrans; Gabapentin; Iodine;  Lyrica; Neomycin; Nsaids; Phenergan; Sulfa antibiotics; Tolmetin; and Hibiclens  Home Medications   Prior to Admission medications   Medication Sig Start Date End Date Taking? Authorizing Provider  acetaminophen (TYLENOL) 500 MG tablet Take 500 mg by mouth every 6 (six) hours as needed for mild pain.   Yes Historical Provider, MD  Alpha-Lipoic Acid 300 MG CAPS Take 1 capsule by mouth 2 (two) times daily.    Yes Historical Provider, MD  Armodafinil 200 MG TABS TAKE ONE TABLET BY MOUTH ONCE DAILY. 07/03/15  Yes Levert Feinstein, MD  Aspirin-Acetaminophen-Caffeine (GOODYS EXTRA STRENGTH) [phone] MG PACK Take 1 packet by mouth daily as needed (Pain).    Yes  Historical Provider, MD  Biotin 10 MG TABS Take 1 tablet by mouth daily.    Yes Historical Provider, MD  Cholecalciferol (VITAMIN D) 2000 units CAPS Take 1 capsule by mouth daily.   Yes Historical Provider, MD  clonazePAM (KLONOPIN) 0.5 MG tablet Take 0.5 mg by mouth 2 (two) times daily as needed for anxiety.  09/13/14  Yes Historical Provider, MD  cloNIDine (CATAPRES) 0.1 MG tablet Take 0.2 mg by mouth 4 (four) times daily.   Yes Historical Provider, MD  cloNIDine HCl (KAPVAY) 0.1 MG TB12 ER tablet Take 0.1 mg by mouth 2 (two) times daily. In addition to 0.2mg  qid   Yes Historical Provider, MD  DULoxetine (CYMBALTA) 60 MG capsule TAKE (1) CAPSULE BY MOUTH ONCE DAILY. 12/08/14  Yes Levert Feinstein, MD  eletriptan (RELPAX) 40 MG tablet Take 1 tablet (40 mg total) by mouth as needed for migraine or headache. May repeat in 2 hours if headache persists or recurs. 04/17/15  Yes Levert Feinstein, MD  ferrous sulfate 325 (65 FE) MG tablet Take 325 mg by mouth daily with breakfast.   Yes Historical Provider, MD  hydrocortisone (CORTEF) 10 MG tablet Take 1 tablet (10 mg total) by mouth 2 (two) times daily. 04/15/15  Yes Carlus Pavlov, MD  hydrOXYzine (ATARAX/VISTARIL) 25 MG tablet Take 25 mg by mouth every 8 (eight) hours as needed for anxiety.    Yes Historical Provider, MD  ibuprofen (ADVIL,MOTRIN) 200 MG tablet Take 200 mg by mouth every 6 (six) hours as needed for mild pain.   Yes Historical Provider, MD  imipramine (TOFRANIL) 50 MG tablet Take 50 mg by mouth 2 (two) times daily.  07/14/15  Yes Historical Provider, MD  lidocaine (LMX) 4 % cream Apply 1 application topically daily as needed (Pain).    Yes Historical Provider, MD  losartan (COZAAR) 100 MG tablet Take 100 mg by mouth daily.   Yes Historical Provider, MD  MAGNESIUM PO Take 1 tablet by mouth daily.    Yes Historical Provider, MD  Multiple Vitamin (MULTIVITAMIN WITH MINERALS) TABS tablet Take 1 tablet by mouth daily.   Yes Historical Provider, MD  mupirocin  cream (BACTROBAN) 2 % Apply 1 application topically daily as needed (Uses on open sores).    Yes Historical Provider, MD  Naloxone HCl (EVZIO) 0.4 MG/0.4ML SOAJ Inject 0.4 mg as directed once as needed (overdose).    Yes Historical Provider, MD  Omega-3 Fatty Acids (FISH OIL PO) Take 1 capsule by mouth daily.    Yes Historical Provider, MD  ondansetron (ZOFRAN) 8 MG tablet TAKE 1 TABLET EVERY 8 HOURS AS NEEDED FOR NAUSEA OR VOMITING. 07/03/15  Yes Nira Retort, NP  oxymorphone (OPANA) 10 MG tablet Take 10 mg by mouth every 6 (six) hours as needed for pain.  07/31/15  Yes Historical  Provider, MD  Potassium 99 MG TABS Take 1 tablet by mouth daily.    Yes Historical Provider, MD  pravastatin (PRAVACHOL) 10 MG tablet Take 10 mg by mouth daily.  03/22/15  Yes Historical Provider, MD  SUMAtriptan 6 MG/0.5ML SOAJ Inject 6 mg into the skin as needed. Patient taking differently: Inject 6 mg into the skin daily as needed (for migraine pain).  04/17/15  Yes Levert FeinsteinYijun Yan, MD  topiramate (TOPAMAX) 100 MG tablet Take 100 mg by mouth 2 (two) times daily.   Yes Historical Provider, MD  Topiramate ER (TROKENDI XR) 200 MG CP24 Take 200 mg by mouth at bedtime. Patient taking differently: Take 200 mg by mouth 2 (two) times daily. In addition to topirimate 100mg  bid 12/25/14  Yes Levert FeinsteinYijun Yan, MD  ciprofloxacin (CIPRO) 500 MG tablet Take 1 tablet (500 mg total) by mouth 2 (two) times daily. Patient not taking: Reported on 08/03/2015 07/28/15   Oneta Rackanika N Lewis, NP   BP 172/113 mmHg  Pulse 90  Temp(Src) 97.6 F (36.4 C) (Oral)  Resp 14  Ht 5\' 2"  (1.575 m)  Wt 66.679 kg  BMI 26.88 kg/m2  SpO2 98%   Physical Exam  Constitutional: She appears well-developed and well-nourished. No distress.  HENT:  Head: Normocephalic and atraumatic.  Right Ear: External ear normal.  Left Ear: External ear normal.  Eyes: Conjunctivae are normal. Right eye exhibits no discharge. Left eye exhibits no discharge. No scleral icterus.  Neck: Neck  supple. No tracheal deviation present.  Cardiovascular: Normal rate, regular rhythm and intact distal pulses.   Pulmonary/Chest: Effort normal and breath sounds normal. No stridor. No respiratory distress. She has no wheezes. She has no rales.  Abdominal: Soft. Bowel sounds are normal. She exhibits no distension. There is no tenderness. There is no rebound and no guarding.  Musculoskeletal: She exhibits no edema or tenderness.  Pt is wearing a right wrist brace  Neurological: She is alert. She has normal strength. No cranial nerve deficit (no facial droop, extraocular movements intact, no slurred speech) or sensory deficit. She exhibits normal muscle tone. She displays no seizure activity. Coordination normal.  Skin: Skin is warm and dry. No petechiae, no purpura and no rash noted.  Few areas of ecchymoses on thighs  Psychiatric: She has a normal mood and affect.  Nursing note and vitals reviewed.   ED Course  Procedures  DIAGNOSTIC STUDIES:  Oxygen Saturation is 98% on RA, normal by my interpretation.    COORDINATION OF CARE:  2:01 PM Will give pain and nausea medication. Will order CT angio chest. Discussed treatment plan with pt at bedside and pt agreed to plan.  Labs Review Labs Reviewed  BASIC METABOLIC PANEL - Abnormal; Notable for the following:    Potassium 3.3 (*)    CO2 21 (*)    All other components within normal limits  CBC - Abnormal; Notable for the following:    RBC 5.24 (*)    Hemoglobin 15.5 (*)    All other components within normal limits  I-STAT TROPOININ, ED  Rosezena SensorI-STAT TROPOININ, ED    Imaging Review Dg Chest 2 View  08/27/2015  CLINICAL DATA:  Chest pain for 1 hour EXAM: CHEST  2 VIEW COMPARISON:  07/26/2015 FINDINGS: The heart size and mediastinal contours are within normal limits. Both lungs are clear. The visualized skeletal structures are unremarkable. IMPRESSION: No active cardiopulmonary disease. Electronically Signed   By: Alcide CleverMark  Lukens M.D.   On:  08/27/2015 13:24   Ct  Angio Chest Pe W/cm &/or Wo Cm  08/27/2015  CLINICAL DATA:  45 year old female with chest pain, nausea and shortness of breath EXAM: CT ANGIOGRAPHY CHEST WITH CONTRAST TECHNIQUE: Multidetector CT imaging of the chest was performed using the standard protocol during bolus administration of intravenous contrast. Multiplanar CT image reconstructions and MIPs were obtained to evaluate the vascular anatomy. CONTRAST:  80 mL Isovue 370 COMPARISON:  Prior chest x-ray 08/27/2015 FINDINGS: Mediastinum: Unremarkable CT appearance of the thyroid gland. No suspicious mediastinal or hilar adenopathy. No soft tissue mediastinal mass. The thoracic esophagus is unremarkable. Heart/Vascular: Adequate opacification of the pulmonary arteries to the proximal subsegmental level. No evidence of central filling defect to suggest acute pulmonary embolus. Normal caliber thoracic aorta. Trace atherosclerotic calcifications along the course of the left anterior descending coronary artery. The heart is normal in size. No pericardial effusion. Lungs/Pleura: The lungs are clear. Bones/Soft Tissues: No acute fracture or aggressive appearing lytic or blastic osseous lesion. Upper Abdomen: Surgical changes of prior gastric bypass surgery. No evidence of a bowel obstruction in the upper abdomen. Gallbladder is surgically absent. Review of the MIP images confirms the above findings. IMPRESSION: 1. Negative for acute pulmonary embolus, pneumonia or other acute cardiopulmonary process. 2. Coronary artery calcifications. Please note that although the presence of coronary artery calcium documents the presence of coronary artery disease, the severity of this disease and any potential stenosis cannot be assessed on this non-gated CT examination. Assessment for potential risk factor modification, dietary therapy or pharmacologic therapy may be warranted, if clinically indicated. 3. Surgical changes of prior gastric bypass.  Electronically Signed   By: Malachy MoanHeath  McCullough M.D.   On: 08/27/2015 16:34   I have personally reviewed and evaluated these images and lab results as part of my medical decision-making.   EKG Interpretation   Date/Time:  Wednesday August 27 2015 12:44:16 EDT Ventricular Rate:  104 PR Interval:  140 QRS Duration: 74 QT Interval:  352 QTC Calculation: 462 R Axis:   107 Text Interpretation:  Sinus tachycardia Possible Inferior infarct , age  undetermined Possible Anterolateral infarct , age undetermined Abnormal  ECG No significant change since last tracing Confirmed by Jaimie Pippins  MD-J, Vidya Bamford  ([id number]) on 08/27/2015 1:27:11 PM      MDM   Final diagnoses:  Chest pain, unspecified chest pain type  Essential hypertension   CT scan without PE or dissection. Pt's symptoms are atypical.  Heart score 3.  Initial troponin is normal. Will check second troponin.  If normal  Will plan on outpatient.  BP improved with oral meds.  Pt will follow up with pcp     I personally performed the services described in this documentation, which was scribed in my presence.  The recorded information has been reviewed and is accurate.    Linwood DibblesJon Delonna Ney, MD 08/27/15 [phone]

## 2015-08-27 NOTE — ED Notes (Signed)
Pt states she started having a sharp central chest pain about 20 mins ago that goes all the way into her back pt also c/o of left sided jaw pain and a "numbness" feeling. Pt also c/o of nausea.

## 2015-08-27 NOTE — Discharge Instructions (Signed)
Hypertension Hypertension, commonly called high blood pressure, is when the force of blood pumping through your arteries is too strong. Your arteries are the blood vessels that carry blood from your heart throughout your body. A blood pressure reading consists of a higher number over a lower number, such as 110/72. The higher number (systolic) is the pressure inside your arteries when your heart pumps. The lower number (diastolic) is the pressure inside your arteries when your heart relaxes. Ideally you want your blood pressure below 120/80. Hypertension forces your heart to work harder to pump blood. Your arteries may become narrow or stiff. Having untreated or uncontrolled hypertension can cause heart attack, stroke, kidney disease, and other problems. RISK FACTORS Some risk factors for high blood pressure are controllable. Others are not.  Risk factors you cannot control include:   Race. You may be at higher risk if you are African American.  Age. Risk increases with age.  Gender. Men are at higher risk than women before age 45 years. After age 65, women are at higher risk than men. Risk factors you can control include:  Not getting enough exercise or physical activity.  Being overweight.  Getting too much fat, sugar, calories, or salt in your diet.  Drinking too much alcohol. SIGNS AND SYMPTOMS Hypertension does not usually cause signs or symptoms. Extremely high blood pressure (hypertensive crisis) may cause headache, anxiety, shortness of breath, and nosebleed. DIAGNOSIS To check if you have hypertension, your health care provider will measure your blood pressure while you are seated, with your arm held at the level of your heart. It should be measured at least twice using the same arm. Certain conditions can cause a difference in blood pressure between your right and left arms. A blood pressure reading that is higher than normal on one occasion does not mean that you need treatment. If  it is not clear whether you have high blood pressure, you may be asked to return on a different day to have your blood pressure checked again. Or, you may be asked to monitor your blood pressure at home for 1 or more weeks. TREATMENT Treating high blood pressure includes making lifestyle changes and possibly taking medicine. Living a healthy lifestyle can help lower high blood pressure. You may need to change some of your habits. Lifestyle changes may include:  Following the DASH diet. This diet is high in fruits, vegetables, and whole grains. It is low in salt, red meat, and added sugars.  Keep your sodium intake below 2,300 mg per day.  Getting at least 30-45 minutes of aerobic exercise at least 4 times per week.  Losing weight if necessary.  Not smoking.  Limiting alcoholic beverages.  Learning ways to reduce stress. Your health care provider may prescribe medicine if lifestyle changes are not enough to get your blood pressure under control, and if one of the following is true:  You are 18-59 years of age and your systolic blood pressure is above 140.  You are 60 years of age or older, and your systolic blood pressure is above 150.  Your diastolic blood pressure is above 90.  You have diabetes, and your systolic blood pressure is over 140 or your diastolic blood pressure is over 90.  You have kidney disease and your blood pressure is above 140/90.  You have heart disease and your blood pressure is above 140/90. Your personal target blood pressure may vary depending on your medical conditions, your age, and other factors. HOME CARE INSTRUCTIONS    Have your blood pressure rechecked as directed by your health care provider.   Take medicines only as directed by your health care provider. Follow the directions carefully. Blood pressure medicines must be taken as prescribed. The medicine does not work as well when you skip doses. Skipping doses also puts you at risk for  problems.  Do not smoke.   Monitor your blood pressure at home as directed by your health care provider. SEEK MEDICAL CARE IF:   You think you are having a reaction to medicines taken.  You have recurrent headaches or feel dizzy.  You have swelling in your ankles.  You have trouble with your vision. SEEK IMMEDIATE MEDICAL CARE IF:  You develop a severe headache or confusion.  You have unusual weakness, numbness, or feel faint.  You have severe chest or abdominal pain.  You vomit repeatedly.  You have trouble breathing. MAKE SURE YOU:   Understand these instructions.  Will watch your condition.  Will get help right away if you are not doing well or get worse.   This information is not intended to replace advice given to you by your health care provider. Make sure you discuss any questions you have with your health care provider.   Document Released: 02/15/2005 Document Revised: 07/02/2014 Document Reviewed: 12/08/2012 Elsevier Interactive Patient Education 2016 [employer]  

## 2015-08-30 ENCOUNTER — Encounter (HOSPITAL_COMMUNITY): Payer: Self-pay | Admitting: Emergency Medicine

## 2015-08-30 ENCOUNTER — Emergency Department (HOSPITAL_COMMUNITY)
Admission: EM | Admit: 2015-08-30 | Discharge: 2015-08-30 | Disposition: A | Discharge status: Home / Self Care | Payer: BLUE CROSS/BLUE SHIELD | Attending: Emergency Medicine | Admitting: Emergency Medicine

## 2015-08-30 DIAGNOSIS — F329 Major depressive disorder, single episode, unspecified: Secondary | ICD-10-CM

## 2015-08-30 DIAGNOSIS — Z791 Long term (current) use of non-steroidal anti-inflammatories (NSAID): Secondary | ICD-10-CM | POA: Diagnosis not present

## 2015-08-30 DIAGNOSIS — I10 Essential (primary) hypertension: Secondary | ICD-10-CM | POA: Diagnosis not present

## 2015-08-30 DIAGNOSIS — M1990 Unspecified osteoarthritis, unspecified site: Secondary | ICD-10-CM | POA: Insufficient documentation

## 2015-08-30 DIAGNOSIS — E119 Type 2 diabetes mellitus without complications: Secondary | ICD-10-CM | POA: Insufficient documentation

## 2015-08-30 DIAGNOSIS — F32A Depression, unspecified: Secondary | ICD-10-CM

## 2015-08-30 DIAGNOSIS — Z046 Encounter for general psychiatric examination, requested by authority: Secondary | ICD-10-CM | POA: Diagnosis present

## 2015-08-30 NOTE — ED Notes (Signed)
Pt reports she is extremely stressed and has been drinking for the past 2 months since finding out her boyfriend was cheating. Pt also reports her son OD and she had to go to [place] to pick him up. Pt states she has only had 2 beers today. Pt brought in voluntarily tonight.

## 2015-08-30 NOTE — ED Notes (Addendum)
Pt refused blood draw and to change into scrubs,

## 2015-08-30 NOTE — Discharge Instructions (Signed)
Follow up with [facility] next week °

## 2015-08-30 NOTE — ED Notes (Signed)
Patient was discharge and escorted back home by [employer] Officer, Chauncey ReadingGann.

## 2015-08-31 NOTE — ED Provider Notes (Signed)
CSN: [id number]     Arrival date & time 08/30/15  2012 History   First MD Initiated Contact with Patient 08/30/15 2222     Chief Complaint  Patient presents with  . V70.1     (Consider location/radiation/quality/duration/timing/severity/associated sxs/prior Treatment) Patient is a 45 y.o. female presenting with altered mental status. The history is provided by the patient (The patient states that she is brought in by the police because she was angry at someone and told police officer that she wanted to kill that woman. Patient states she had been drinking alcohol and she no longer wants to hurt anyone).  Altered Mental Status Presenting symptoms: behavior changes   Severity:  Severe Most recent episode:  Today Episode history:  Single Timing:  Unable to specify Progression:  Resolved Chronicity:  New Context: alcohol use   Associated symptoms: agitation   Associated symptoms: no abdominal pain, no hallucinations, no headaches, no rash and no seizures     Past Medical History  Diagnosis Date  . Hypertension   . Peripheral neuropathy (HCC)   . Facial tic   . Diabetes mellitus without complication (HCC)   . Polysubstance abuse     etoh, opiates  . Migraines   . Depression   . Anxiety   . Osteoarthritis   . Neuropathic pain of both feet (HCC)     chronic  . Cellulitis     Right foot  . Seizure (HCC)   . Ileus [facility])     hospitalized in November 2016  . Chronic foot pain   . Chronic back pain   . Adrenal insufficiency (HCC)   . Fibromyalgia    Past Surgical History  Procedure Laterality Date  . 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 Abdominal hysterectomy  2013  . Ankle fusion Right 1991  . Appendectomy  1987  . Cesarean section  1997  . Abdominoplasty  2013  . Cholecystectomy    . Incision and drainage abscess N/A 09/25/2013    Procedure: INCISION AND DRAINAGE ABSCESS;  Surgeon: Cherylynn Ridges, MD;  Location: [facility] OR;  Service: General;   Laterality: N/A;  . Cholecystectomy    . Esophagogastroduodenoscopy (egd) with propofol N/A 11/03/2014    WUJ:[id number] gastric pouch  . Biopsy N/A 11/03/2014    Procedure: BIOPSY;  Surgeon: Malissa Hippo, MD;  Location: [facility] ORS;  Service: Endoscopy;  Laterality: N/A;   Family History  Problem Relation Age of Onset  . Bipolar disorder Sister   . Drug abuse Sister   . Schizophrenia Son   . Bipolar disorder Father   . Alcohol abuse Father   . Drug abuse Mother   . Alcohol abuse Mother   . Colon cancer Neg Hx    Social History  Substance Use Topics  . Smoking status: Never Smoker   . Smokeless tobacco: Never Used  . Alcohol Use: Yes     Comment: Pt drinking daily   OB History    Gravida Para Term Preterm AB TAB SAB Ectopic Multiple Living   Review of Systems  Constitutional: Negative for appetite change and fatigue.  HENT: Negative for congestion, ear discharge and sinus pressure.   Eyes: Negative for discharge.  Respiratory: Negative for cough.   Cardiovascular: Negative for chest pain.  Gastrointestinal: Negative for abdominal pain and diarrhea.  Genitourinary: Negative for frequency and hematuria.  Musculoskeletal: Negative for  back pain.  Skin: Negative for rash.  Neurological: Negative for seizures and headaches.  Psychiatric/Behavioral: Positive for agitation. Negative for hallucinations.      Allergies  Doxycycline; Keflex; Butrans; Gabapentin; Iodine; Lyrica; Neomycin; Nsaids; Phenergan; Sulfa antibiotics; Tolmetin; and Hibiclens  Home Medications   Prior to Admission medications   Medication Sig Start Date End Date Taking? Authorizing Provider  acetaminophen (TYLENOL) 500 MG tablet Take 500 mg by mouth every 6 (six) hours as needed for mild pain.   Yes Historical Provider, MD  Alpha-Lipoic Acid 300 MG CAPS Take 1 capsule by mouth 2 (two) times daily.    Yes Historical Provider, MD  Armodafinil 200 MG TABS TAKE ONE TABLET BY MOUTH ONCE DAILY.  07/03/15  Yes Levert FeinsteinYijun Yan, MD  Aspirin-Acetaminophen-Caffeine (GOODYS EXTRA STRENGTH) 907 585 3211500-325-65 MG PACK Take 1 packet by mouth daily as needed (Pain).    Yes Historical Provider, MD  Biotin 10 MG TABS Take 1 tablet by mouth daily.    Yes Historical Provider, MD  Cholecalciferol (VITAMIN D) 2000 units CAPS Take 1 capsule by mouth daily.   Yes Historical Provider, MD  clonazePAM (KLONOPIN) 0.5 MG tablet Take 0.5 mg by mouth 2 (two) times daily as needed for anxiety.  09/13/14  Yes Historical Provider, MD  cloNIDine (CATAPRES) 0.1 MG tablet Take 0.2 mg by mouth 4 (four) times daily.   Yes Historical Provider, MD  cloNIDine HCl (KAPVAY) 0.1 MG TB12 ER tablet Take 0.1 mg by mouth 2 (two) times daily. In addition to 0.2mg  qid   Yes Historical Provider, MD  DULoxetine (CYMBALTA) 60 MG capsule TAKE (1) CAPSULE BY MOUTH ONCE DAILY. 12/08/14  Yes Levert FeinsteinYijun Yan, MD  eletriptan (RELPAX) 40 MG tablet Take 1 tablet (40 mg total) by mouth as needed for migraine or headache. May repeat in 2 hours if headache persists or recurs. 04/17/15  Yes Levert FeinsteinYijun Yan, MD  ferrous sulfate 325 (65 FE) MG tablet Take 325 mg by mouth daily with breakfast.   Yes Historical Provider, MD  hydrocortisone (CORTEF) 10 MG tablet Take 1 tablet (10 mg total) by mouth 2 (two) times daily. 04/15/15  Yes Carlus Pavlovristina Gherghe, MD  hydrOXYzine (ATARAX/VISTARIL) 25 MG tablet Take 25 mg by mouth every 8 (eight) hours as needed for anxiety.    Yes Historical Provider, MD  ibuprofen (ADVIL,MOTRIN) 200 MG tablet Take 200 mg by mouth every 6 (six) hours as needed for mild pain.   Yes Historical Provider, MD  imipramine (TOFRANIL) 50 MG tablet Take 50 mg by mouth 2 (two) times daily.  07/14/15  Yes Historical Provider, MD  lidocaine (LMX) 4 % cream Apply 1 application topically daily as needed (Pain).    Yes Historical Provider, MD  losartan (COZAAR) 100 MG tablet Take 100 mg by mouth daily.   Yes Historical Provider, MD  MAGNESIUM PO Take 1 tablet by mouth daily.    Yes  Historical Provider, MD  Multiple Vitamin (MULTIVITAMIN WITH MINERALS) TABS tablet Take 1 tablet by mouth daily.   Yes Historical Provider, MD  mupirocin cream (BACTROBAN) 2 % Apply 1 application topically daily as needed (Uses on open sores).    Yes Historical Provider, MD  Naloxone HCl (EVZIO) 0.4 MG/0.4ML SOAJ Inject 0.4 mg as directed once as needed (overdose).    Yes Historical Provider, MD  Omega-3 Fatty Acids (FISH OIL PO) Take 1 capsule by mouth daily.    Yes Historical Provider, MD  ondansetron (ZOFRAN) 8 MG tablet TAKE 1 TABLET EVERY 8 HOURS AS NEEDED FOR  NAUSEA OR VOMITING. 07/03/15  Yes Nira RetortAnna W Sams, NP  oxymorphone (OPANA) 10 MG tablet Take 10 mg by mouth every 6 (six) hours as needed for pain.  07/31/15  Yes Historical Provider, MD  Potassium 99 MG TABS Take 1 tablet by mouth daily.    Yes Historical Provider, MD  pravastatin (PRAVACHOL) 10 MG tablet Take 10 mg by mouth daily.  03/22/15  Yes Historical Provider, MD  SUMAtriptan 6 MG/0.5ML SOAJ Inject 6 mg into the skin as needed. Patient taking differently: Inject 6 mg into the skin daily as needed (for migraine pain).  04/17/15  Yes Levert FeinsteinYijun Yan, MD  topiramate (TOPAMAX) 100 MG tablet Take 100 mg by mouth 2 (two) times daily.   Yes Historical Provider, MD  Topiramate ER (TROKENDI XR) 200 MG CP24 Take 200 mg by mouth at bedtime. Patient taking differently: Take 200 mg by mouth 2 (two) times daily. In addition to topirimate 100mg  bid 12/25/14  Yes Levert FeinsteinYijun Yan, MD  ciprofloxacin (CIPRO) 500 MG tablet Take 1 tablet (500 mg total) by mouth 2 (two) times daily. Patient not taking: Reported on 08/03/2015 07/28/15   Oneta Rackanika N Lewis, NP   BP 142/93 mmHg  Pulse 101  Temp(Src) 98.6 F (37 C) (Oral)  Resp 16  Ht 5\' 3"  (1.6 m)  Wt 145 lb (65.772 kg)  BMI 25.69 kg/m2  SpO2 100% Physical Exam  Constitutional: She is oriented to person, place, and time. She appears well-developed.  HENT:  Head: Normocephalic.  Eyes: Conjunctivae and EOM are normal. No  scleral icterus.  Neck: Neck supple. No thyromegaly present.  Cardiovascular: Normal rate and regular rhythm.  Exam reveals no gallop and no friction rub.   No murmur heard. Pulmonary/Chest: No stridor. She has no wheezes. She has no rales. She exhibits no tenderness.  Abdominal: She exhibits no distension. There is no tenderness. There is no rebound.  Musculoskeletal: Normal range of motion. She exhibits no edema.  Lymphadenopathy:    She has no cervical adenopathy.  Neurological: She is oriented to person, place, and time. She exhibits normal muscle tone. Coordination normal.  Skin: No rash noted. No erythema.  Psychiatric:  Anxious mildly depressed not suicidal nor homicidal having no hallucinations    ED Course  Procedures (including critical care time) Labs Review Labs Reviewed - No data to display  Imaging Review No results found. I have personally reviewed and evaluated these images and lab results as part of my medical decision-making.   EKG Interpretation None      MDM   Final diagnoses:  Depression    Patient with homicidal ideations initially. She no longer has homicidal ideations or suicidal ideations. She is not having hallucinations. She wants to go home. I feel like she's not a threat to herself or anyone else at this moment and will be discharged home    Bethann BerkshireJoseph Virdell Hoiland, MD 08/31/15 1140

## 2015-10-15 ENCOUNTER — Ambulatory Visit: Payer: BLUE CROSS/BLUE SHIELD | Admitting: Nurse Practitioner

## 2015-10-17 ENCOUNTER — Encounter: Payer: Self-pay | Admitting: Nurse Practitioner

## 2016-01-27 ENCOUNTER — Other Ambulatory Visit: Payer: Self-pay | Admitting: Neurology

## 2016-02-21 IMAGING — DX DG SHOULDER 2+V*R*
3 series · 3 of 3 positions shown · non-contrast
Comparison: None.

CLINICAL DATA: Right shoulder pain. Fell out of bed this morning
landing on the right arm.

EXAM:
RIGHT SHOULDER - 2+ VIEW

[shoulder grashey]
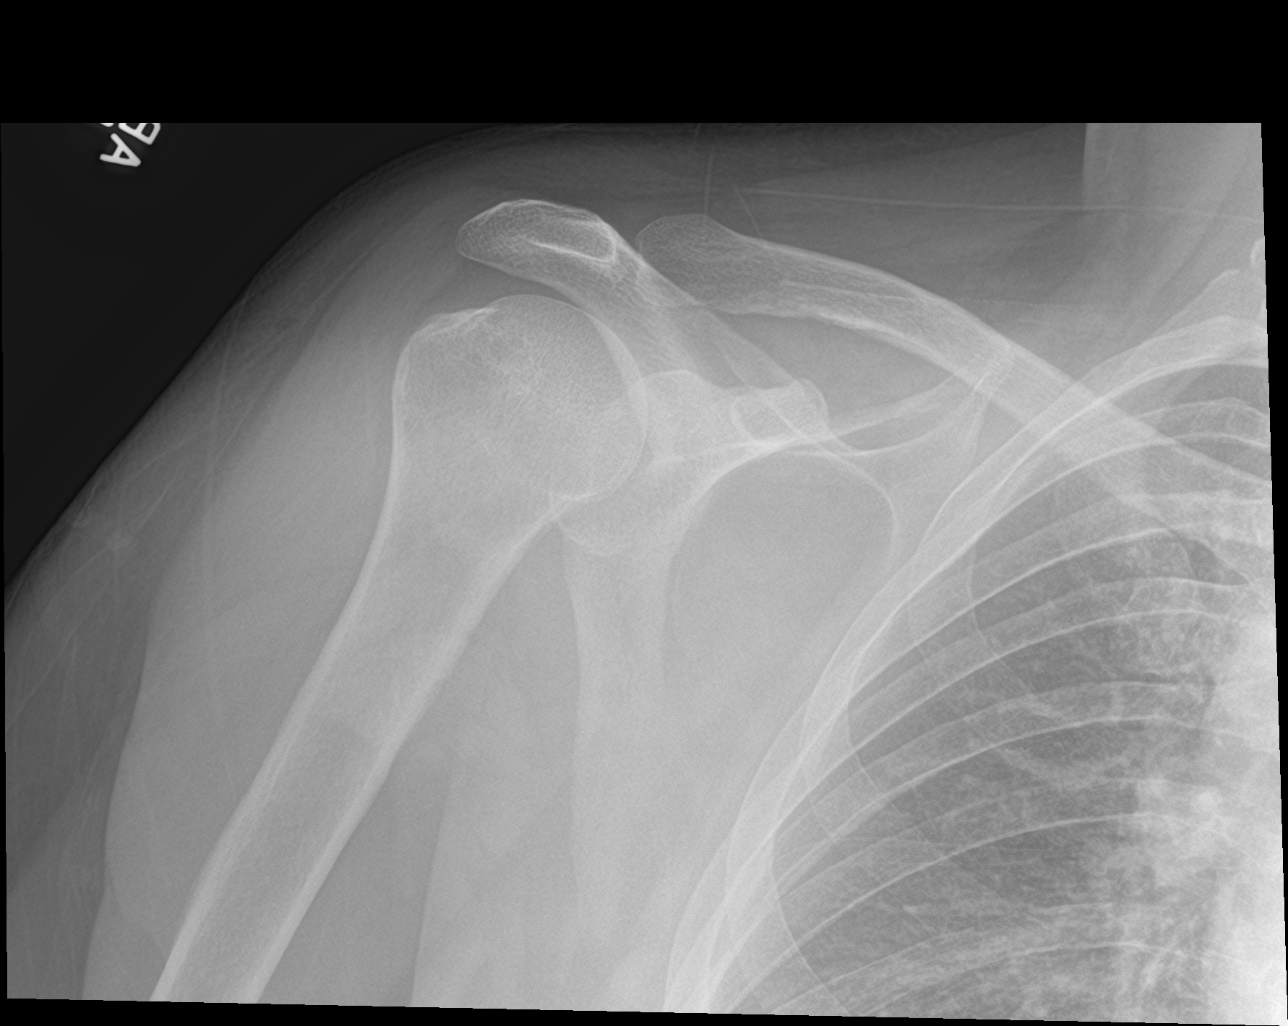

[shoulder y view]
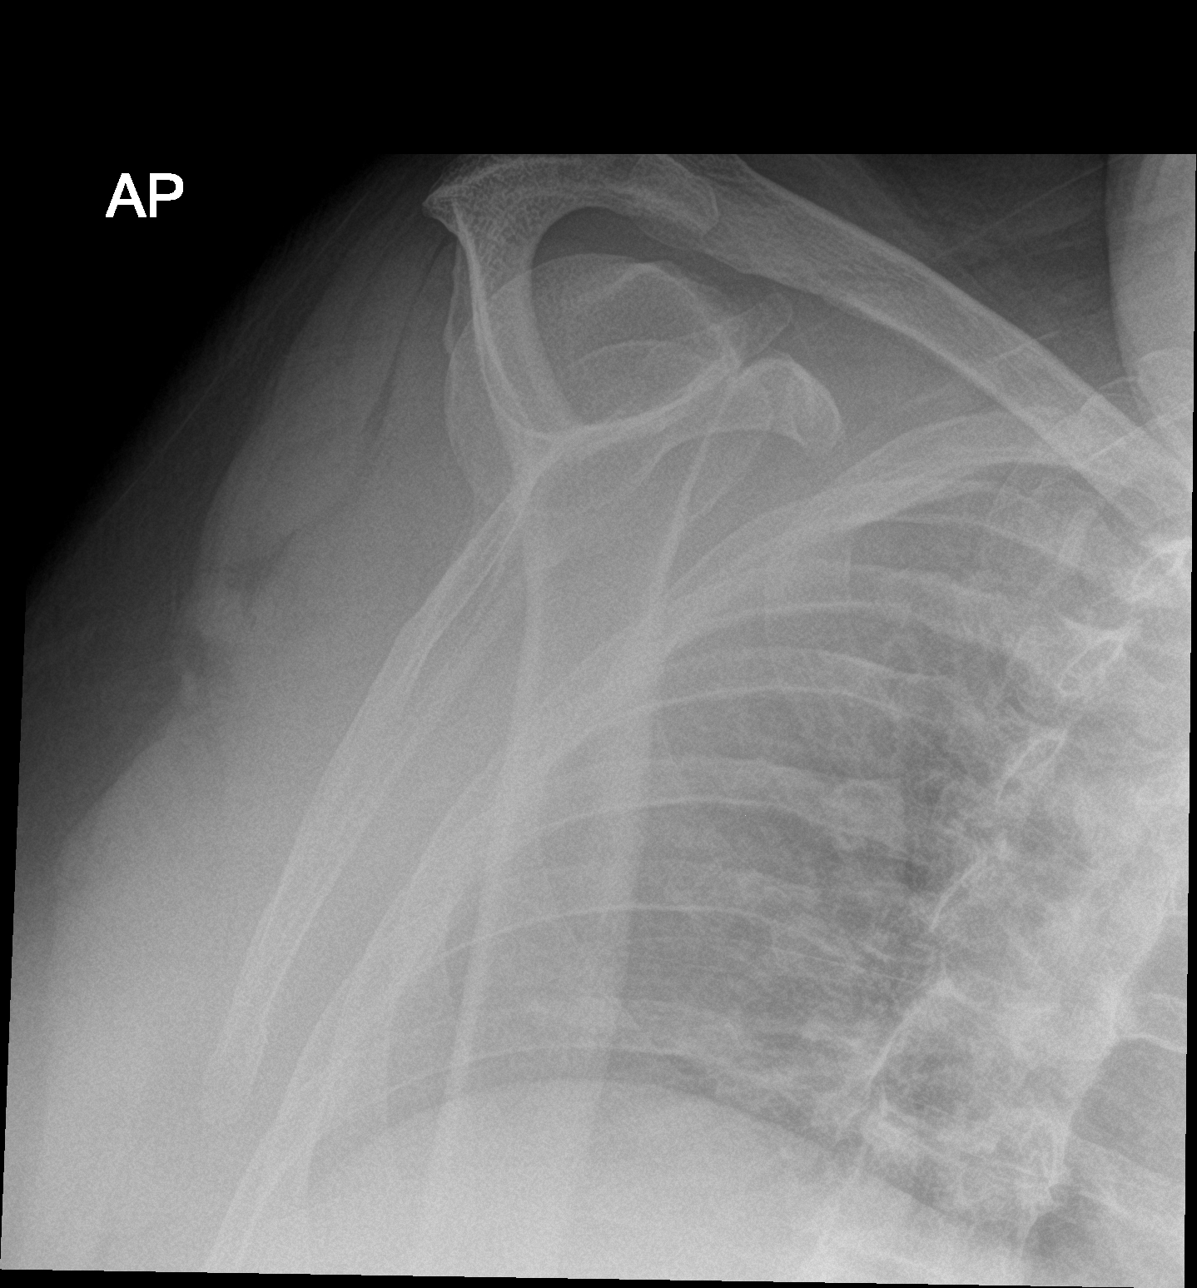

[shoulder axillary]
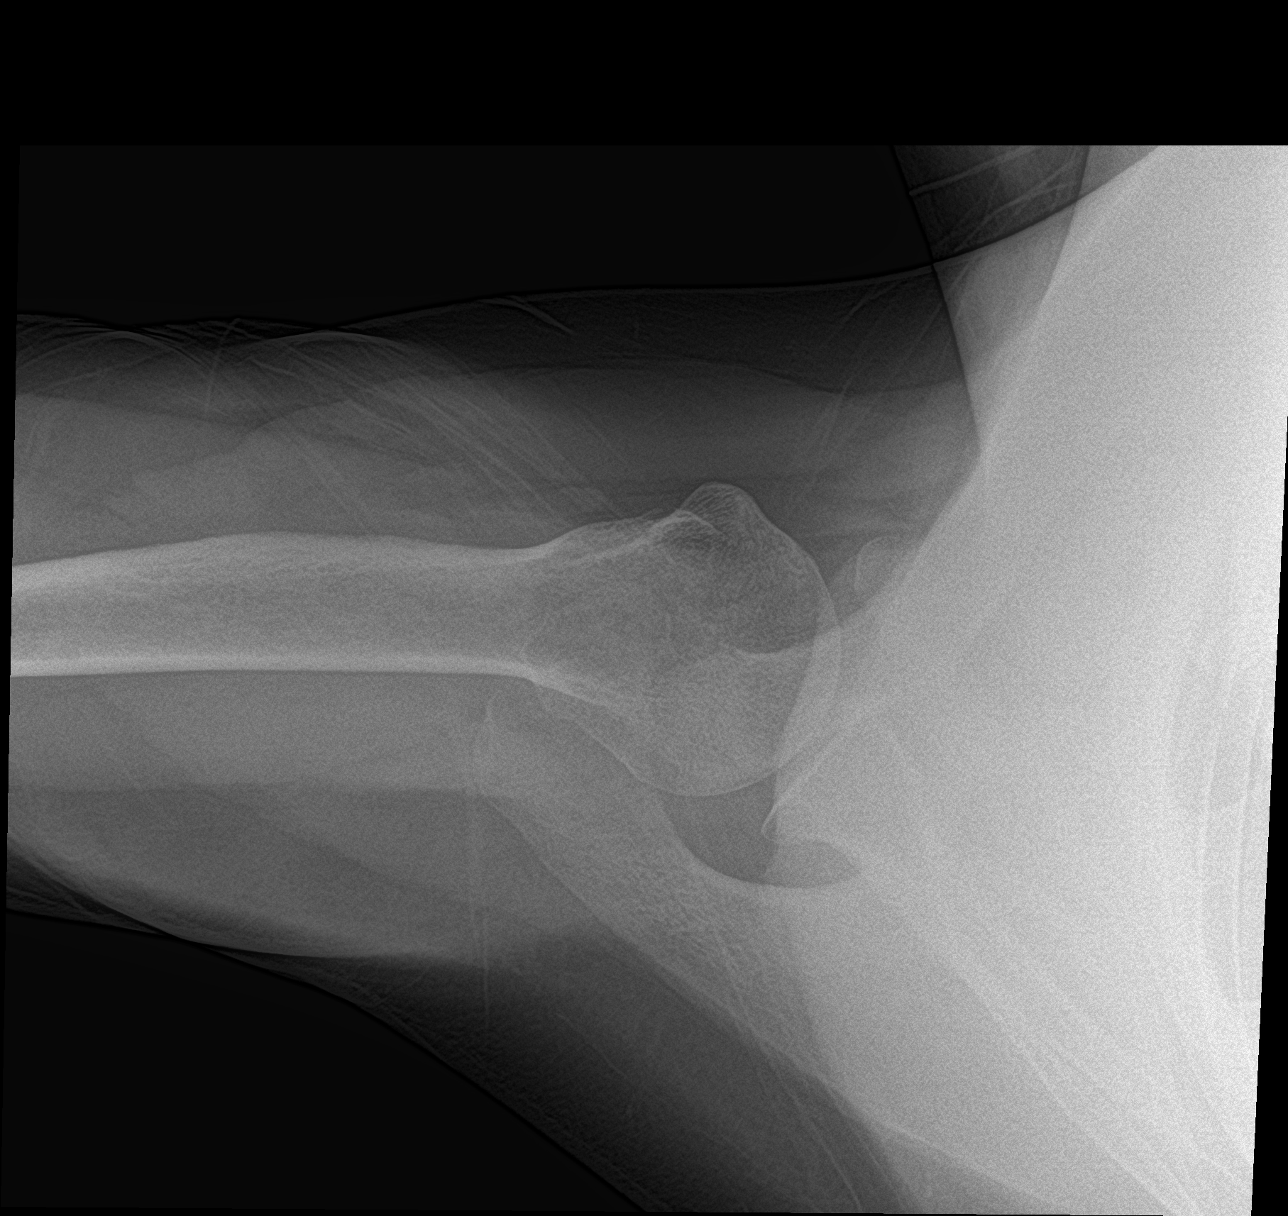

[3 of 3 positions shown; findings below may reference images not displayed]

FINDINGS: There is no evidence of fracture or dislocation. There is no
evidence of arthropathy or other focal bone abnormality. Soft
tissues are unremarkable.
IMPRESSION: Negative.

## 2016-03-20 IMAGING — CR DG CHEST 1V PORT
1 series · 1 of 1 positions shown · non-contrast
Comparison: Chest radiograph 10/25/2014

CLINICAL DATA: Patient recently discharged from hospital for
abdominal pain. Tired and fatigued.

EXAM:
PORTABLE CHEST 1 VIEW

[AP]
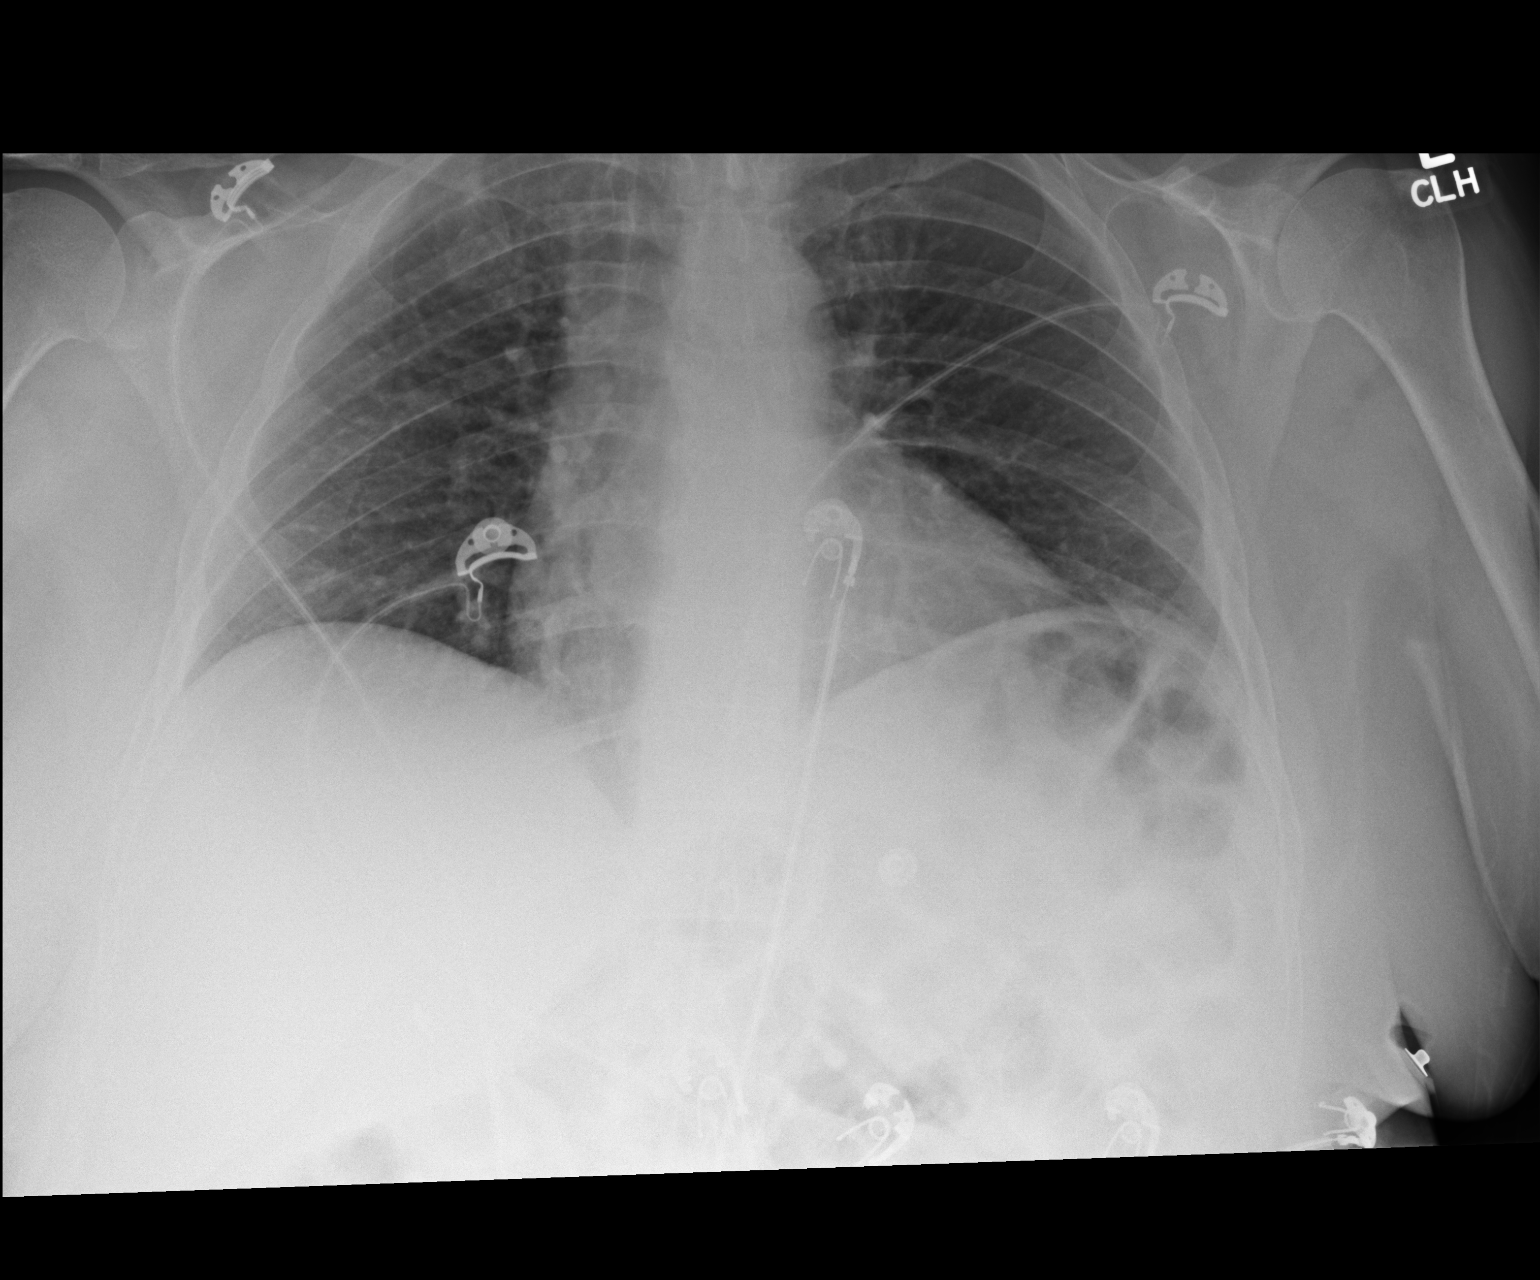

[1 of 1 positions shown; findings below may reference images not displayed]

FINDINGS: Monitoring leads overlie the patient. Stable cardiac and mediastinal
contours. No consolidative pulmonary opacities. No pleural effusion
or pneumothorax.
IMPRESSION: No active disease.

## 2016-03-20 IMAGING — CT CT HEAD W/O CM
1 series · 16 of 30 positions shown, 20 images · non-contrast
Comparison: 01/03/2011.

CLINICAL DATA: First time seizure. Dizziness and fall prior to the
seizure.

EXAM:
CT HEAD WITHOUT CONTRAST
TECHNIQUE: Contiguous axial images were obtained from the base of the skull
through the vertex without intravenous contrast.

[Series 2: head 5.0 h30s · axial · 0.43mm/px · z∈[+1301,+1451]mm · 16 of 34 slices shown, 20 images]
[im 2/34  brain]
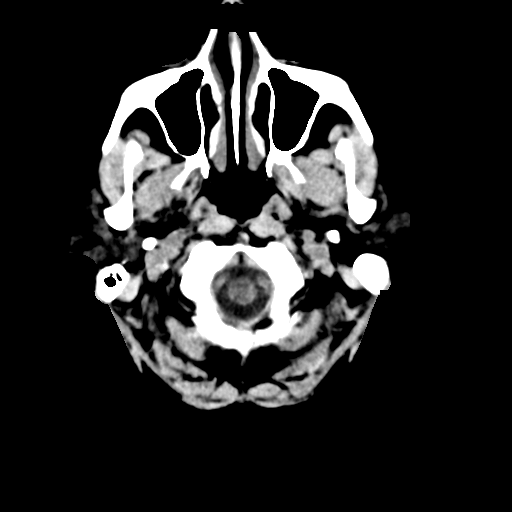
[im 2/34  bone]
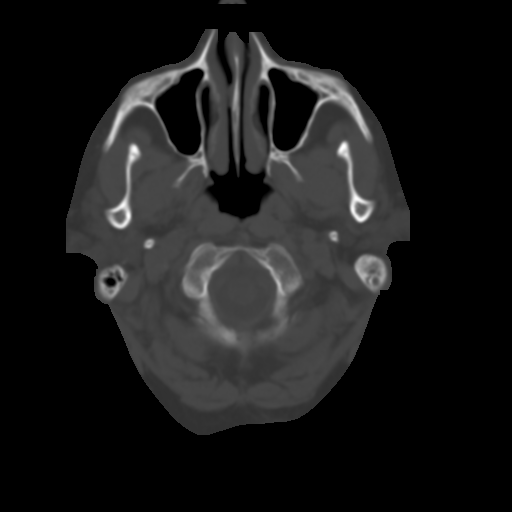
[im 4/34  brain]
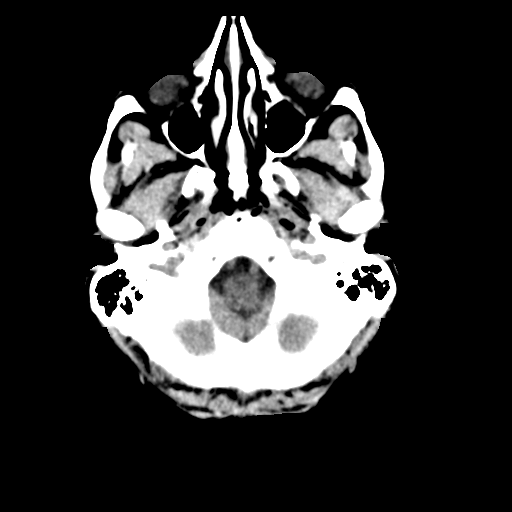
[im 6/34  brain]
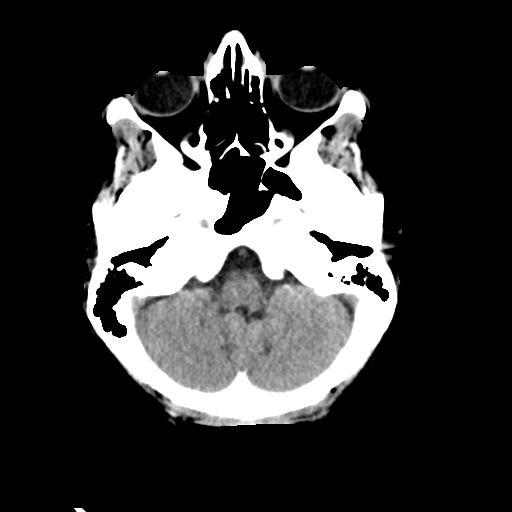
[im 8/34  brain]
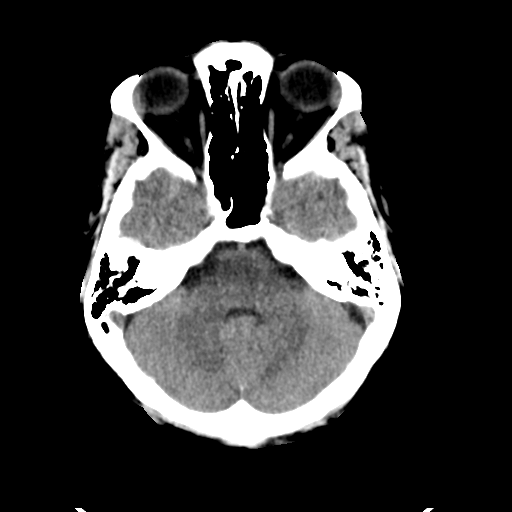
[im 10/34  brain]
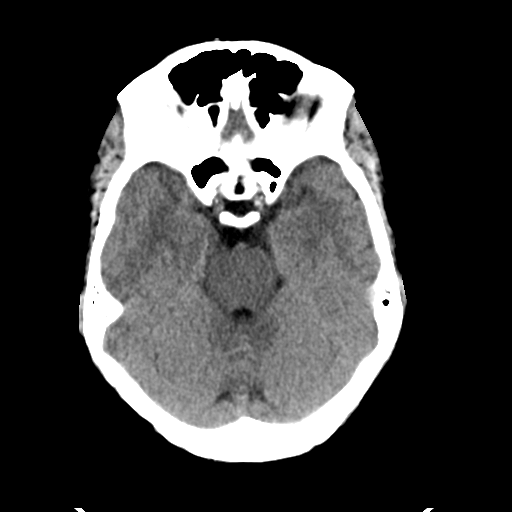
[im 10/34  bone]
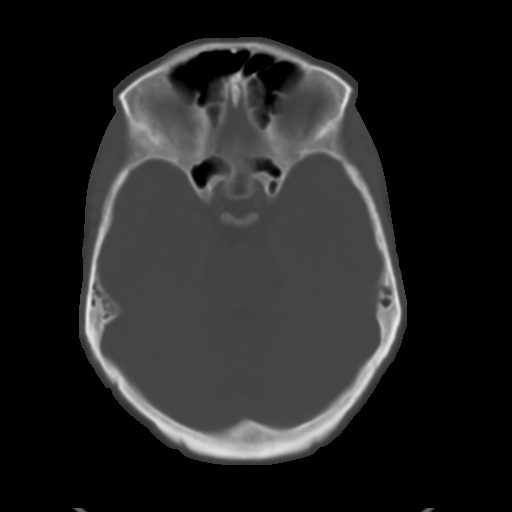
[im 12/34  brain]
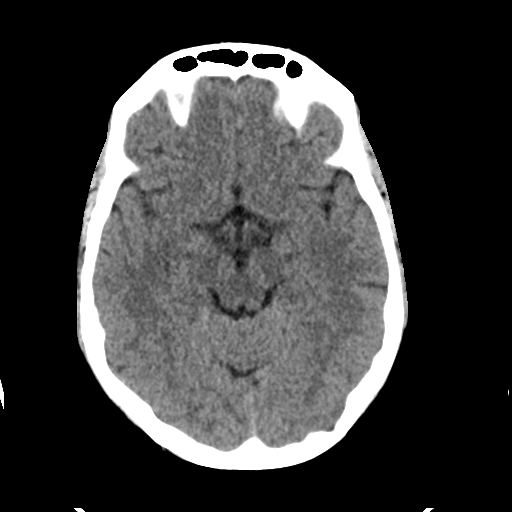
[im 14/34  brain]
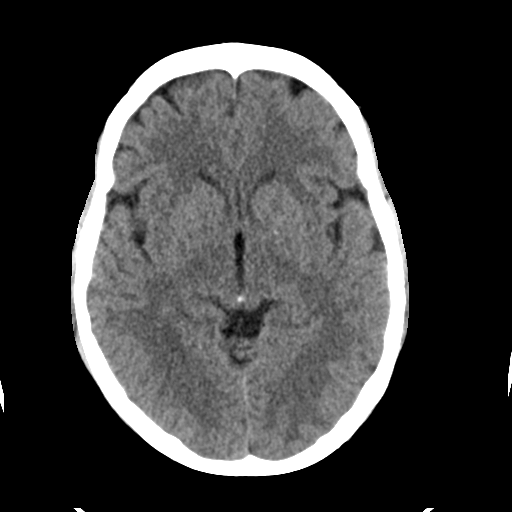
[im 16/34  brain]
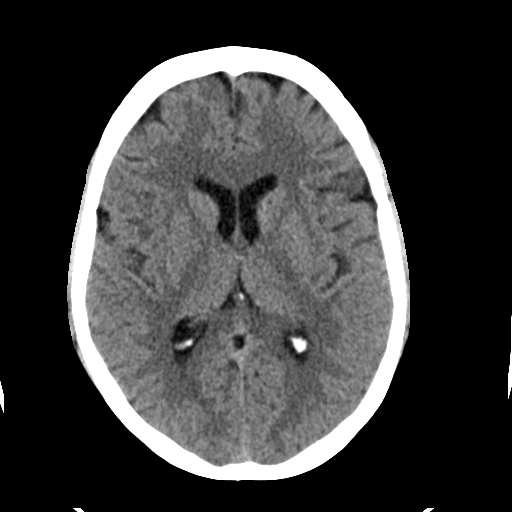
[im 18/34  brain]
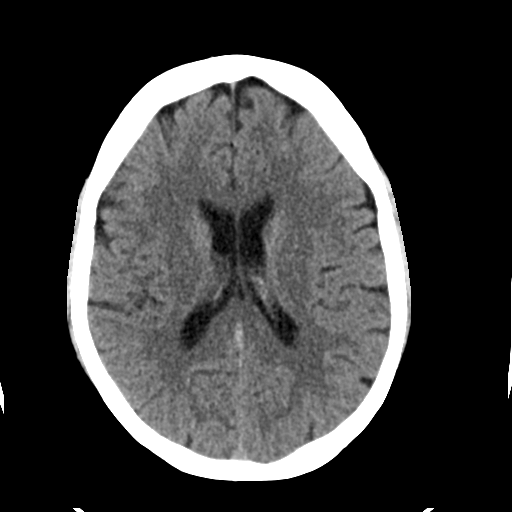
[im 18/34  bone]
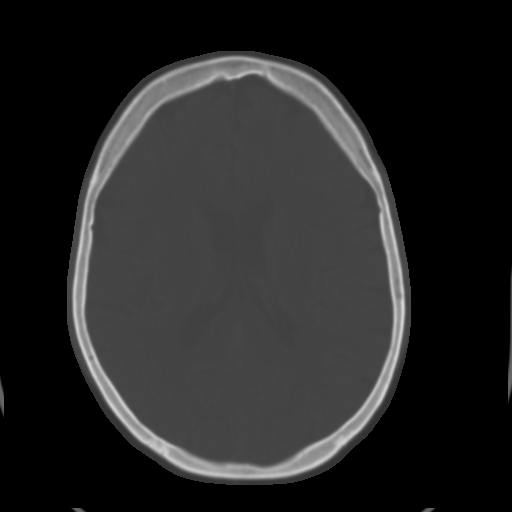
[im 20/34  brain]
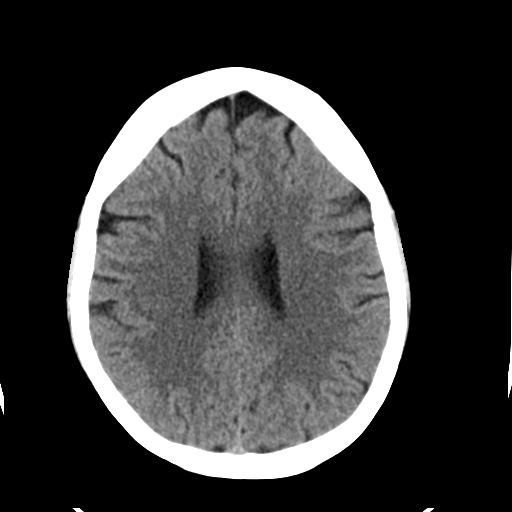
[im 22/34  brain]
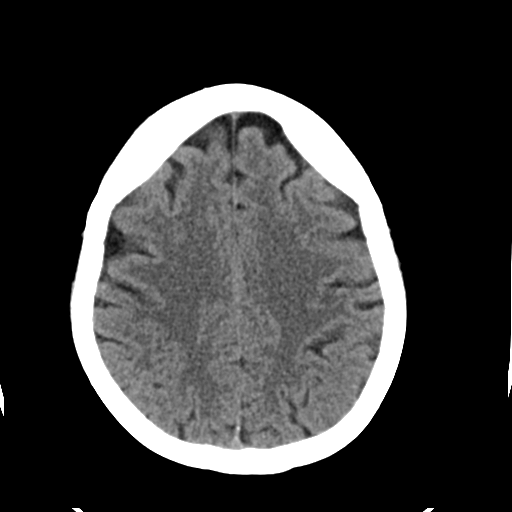
[im 24/34  brain]
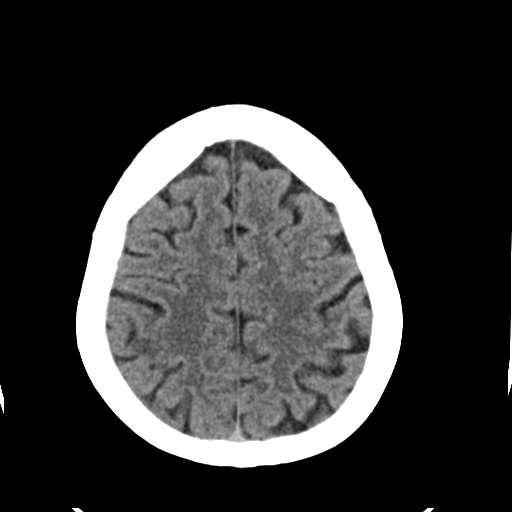
[im 26/34  brain]
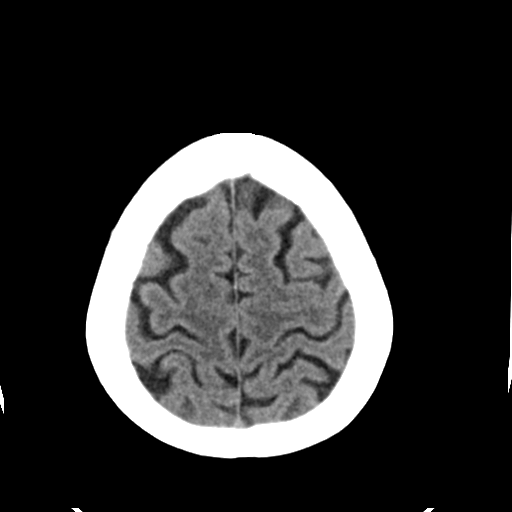
[im 26/34  bone]
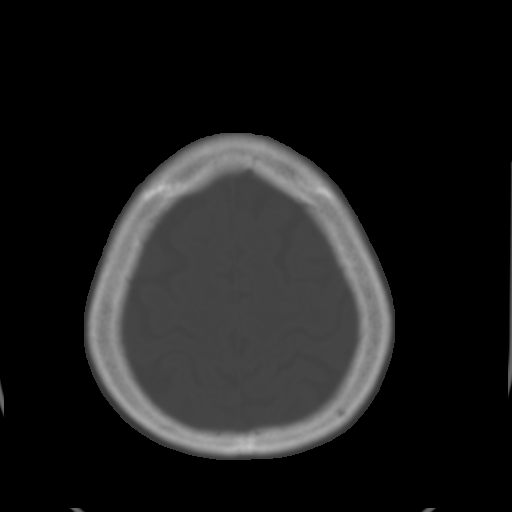
[im 28/34  brain]
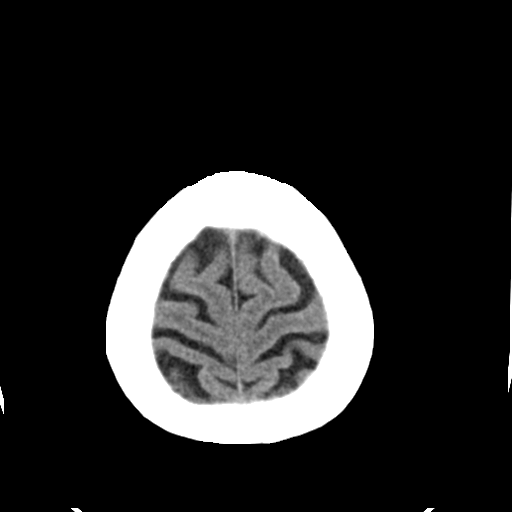
[im 30/34  brain]
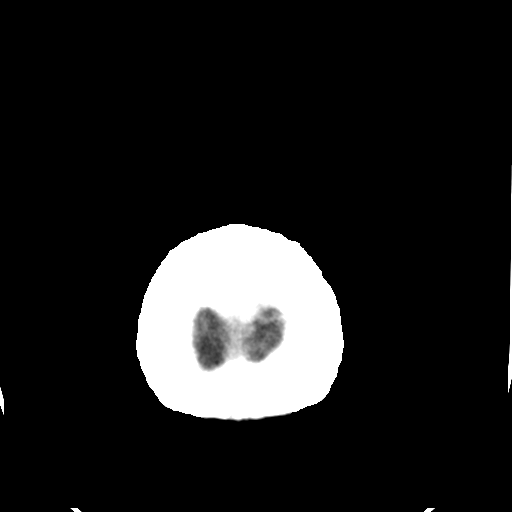
[im 32/34  brain]
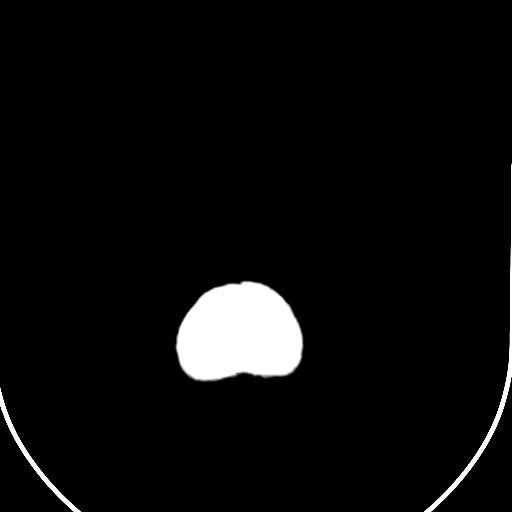

[16 of 30 positions shown; findings below may reference images not displayed]

FINDINGS: Normal appearing cerebral hemispheres and posterior fossa
structures. Normal size and position of the ventricles. No skull
fracture, intracranial hemorrhage, mass lesion, CT evidence of acute
infarction or paranasal sinus air-fluid levels. Minimal left
maxillary and right sphenoid sinus mucosal thickening.
IMPRESSION: 1. No skull fracture or intracranial hemorrhage.
2. Minimal chronic sinusitis.

## 2017-06-29 DEATH — deceased
